# Patient Record
Sex: Male | Born: 1935 | Race: White | Hispanic: No | Marital: Married | Smoking: Former smoker
Health system: Southern US, Community
[De-identification: ages and names within clinical notes are randomized; demographics above are authoritative.]

## PROBLEM LIST (undated history)

## (undated) DIAGNOSIS — I10 Essential (primary) hypertension: Secondary | ICD-10-CM

## (undated) DIAGNOSIS — N400 Enlarged prostate without lower urinary tract symptoms: Secondary | ICD-10-CM

## (undated) DIAGNOSIS — I2510 Atherosclerotic heart disease of native coronary artery without angina pectoris: Secondary | ICD-10-CM

## (undated) DIAGNOSIS — F0390 Unspecified dementia without behavioral disturbance: Secondary | ICD-10-CM

## (undated) DIAGNOSIS — I359 Nonrheumatic aortic valve disorder, unspecified: Secondary | ICD-10-CM

## (undated) DIAGNOSIS — I509 Heart failure, unspecified: Secondary | ICD-10-CM

## (undated) DIAGNOSIS — H9190 Unspecified hearing loss, unspecified ear: Secondary | ICD-10-CM

## (undated) DIAGNOSIS — I059 Rheumatic mitral valve disease, unspecified: Secondary | ICD-10-CM

## (undated) DIAGNOSIS — I4891 Unspecified atrial fibrillation: Secondary | ICD-10-CM

## (undated) DIAGNOSIS — Z87898 Personal history of other specified conditions: Secondary | ICD-10-CM

## (undated) HISTORY — DX: Benign prostatic hyperplasia without lower urinary tract symptoms: N40.0

## (undated) HISTORY — DX: Nonrheumatic aortic valve disorder, unspecified: I35.9

## (undated) HISTORY — DX: Essential (primary) hypertension: I10

## (undated) HISTORY — DX: Unspecified atrial fibrillation: I48.91

## (undated) HISTORY — DX: Atherosclerotic heart disease of native coronary artery without angina pectoris: I25.10

## (undated) HISTORY — DX: Rheumatic mitral valve disease, unspecified: I05.9

## (undated) HISTORY — DX: Personal history of other specified conditions: Z87.898

---

## 2009-08-23 ENCOUNTER — Encounter: Payer: Self-pay | Admitting: Physician Assistant

## 2009-08-23 ENCOUNTER — Encounter: Payer: Self-pay | Admitting: Cardiology

## 2009-08-23 ENCOUNTER — Ambulatory Visit: Payer: Self-pay | Admitting: Cardiology

## 2009-08-24 ENCOUNTER — Encounter: Payer: Self-pay | Admitting: Cardiology

## 2009-08-26 ENCOUNTER — Encounter: Payer: Self-pay | Admitting: Cardiology

## 2009-08-28 ENCOUNTER — Encounter: Payer: Self-pay | Admitting: Cardiology

## 2009-08-31 ENCOUNTER — Ambulatory Visit (HOSPITAL_COMMUNITY): Admission: RE | Admit: 2009-08-31 | Discharge: 2009-08-31 | Payer: Self-pay | Admitting: Internal Medicine

## 2009-08-31 ENCOUNTER — Encounter: Payer: Self-pay | Admitting: Cardiology

## 2009-09-08 ENCOUNTER — Encounter: Payer: Self-pay | Admitting: Cardiology

## 2009-09-11 DIAGNOSIS — I10 Essential (primary) hypertension: Secondary | ICD-10-CM | POA: Insufficient documentation

## 2009-09-11 DIAGNOSIS — I482 Chronic atrial fibrillation, unspecified: Secondary | ICD-10-CM

## 2009-09-11 DIAGNOSIS — R0602 Shortness of breath: Secondary | ICD-10-CM

## 2009-09-14 ENCOUNTER — Ambulatory Visit: Payer: Self-pay | Admitting: Cardiology

## 2009-09-17 ENCOUNTER — Telehealth (INDEPENDENT_AMBULATORY_CARE_PROVIDER_SITE_OTHER): Payer: Self-pay | Admitting: *Deleted

## 2009-09-24 ENCOUNTER — Telehealth: Payer: Self-pay | Admitting: Cardiology

## 2009-09-24 ENCOUNTER — Telehealth: Payer: Self-pay | Admitting: Adult Health

## 2009-09-29 ENCOUNTER — Encounter: Payer: Self-pay | Admitting: Cardiology

## 2009-10-02 ENCOUNTER — Telehealth (INDEPENDENT_AMBULATORY_CARE_PROVIDER_SITE_OTHER): Payer: Self-pay | Admitting: *Deleted

## 2009-10-05 ENCOUNTER — Encounter (INDEPENDENT_AMBULATORY_CARE_PROVIDER_SITE_OTHER): Payer: Self-pay | Admitting: *Deleted

## 2009-10-09 ENCOUNTER — Encounter: Payer: Self-pay | Admitting: Cardiology

## 2009-10-12 ENCOUNTER — Ambulatory Visit: Payer: Self-pay | Admitting: Cardiology

## 2009-10-13 ENCOUNTER — Telehealth (INDEPENDENT_AMBULATORY_CARE_PROVIDER_SITE_OTHER): Payer: Self-pay | Admitting: *Deleted

## 2009-10-14 ENCOUNTER — Inpatient Hospital Stay (HOSPITAL_COMMUNITY): Admission: EM | Admit: 2009-10-14 | Discharge: 2009-10-29 | Payer: Self-pay | Admitting: Internal Medicine

## 2009-10-14 ENCOUNTER — Ambulatory Visit: Payer: Self-pay | Admitting: Thoracic Surgery (Cardiothoracic Vascular Surgery)

## 2009-10-14 ENCOUNTER — Ambulatory Visit: Payer: Self-pay | Admitting: Cardiology

## 2009-10-14 ENCOUNTER — Encounter: Payer: Self-pay | Admitting: Internal Medicine

## 2009-10-15 ENCOUNTER — Encounter: Payer: Self-pay | Admitting: Cardiology

## 2009-10-15 ENCOUNTER — Encounter: Payer: Self-pay | Admitting: Internal Medicine

## 2009-10-16 ENCOUNTER — Ambulatory Visit: Payer: Self-pay | Admitting: Dentistry

## 2009-10-16 HISTORY — PX: OTHER SURGICAL HISTORY: SHX169

## 2009-10-16 HISTORY — PX: MULTIPLE TOOTH EXTRACTIONS: SHX2053

## 2009-10-19 ENCOUNTER — Encounter: Payer: Self-pay | Admitting: Cardiology

## 2009-10-20 ENCOUNTER — Ambulatory Visit: Payer: Self-pay | Admitting: Dentistry

## 2009-10-23 ENCOUNTER — Encounter: Payer: Self-pay | Admitting: Thoracic Surgery (Cardiothoracic Vascular Surgery)

## 2009-10-23 ENCOUNTER — Encounter: Payer: Self-pay | Admitting: Cardiology

## 2009-10-24 ENCOUNTER — Encounter: Payer: Self-pay | Admitting: Cardiology

## 2009-10-29 ENCOUNTER — Encounter: Payer: Self-pay | Admitting: Cardiology

## 2009-11-02 ENCOUNTER — Encounter: Payer: Self-pay | Admitting: Cardiovascular Disease

## 2009-11-02 ENCOUNTER — Telehealth: Payer: Self-pay | Admitting: Cardiovascular Disease

## 2009-11-02 LAB — CONVERTED CEMR LAB: Prothrombin Time: 20.7 s

## 2009-11-04 ENCOUNTER — Encounter: Payer: Self-pay | Admitting: Cardiology

## 2009-11-04 ENCOUNTER — Telehealth: Payer: Self-pay | Admitting: Cardiology

## 2009-11-05 ENCOUNTER — Encounter: Payer: Self-pay | Admitting: Cardiology

## 2009-11-06 ENCOUNTER — Telehealth: Payer: Self-pay | Admitting: Internal Medicine

## 2009-11-09 ENCOUNTER — Telehealth (INDEPENDENT_AMBULATORY_CARE_PROVIDER_SITE_OTHER): Payer: Self-pay | Admitting: *Deleted

## 2009-11-09 ENCOUNTER — Telehealth: Payer: Self-pay | Admitting: Cardiology

## 2009-11-09 ENCOUNTER — Encounter: Payer: Self-pay | Admitting: Cardiology

## 2009-11-09 LAB — CONVERTED CEMR LAB: Prothrombin Time: 53.2 s

## 2009-11-11 ENCOUNTER — Encounter: Payer: Self-pay | Admitting: Cardiology

## 2009-11-12 ENCOUNTER — Telehealth: Payer: Self-pay | Admitting: Cardiology

## 2009-11-12 ENCOUNTER — Encounter: Payer: Self-pay | Admitting: Cardiology

## 2009-11-12 LAB — CONVERTED CEMR LAB: POC INR: 3

## 2009-11-13 ENCOUNTER — Encounter: Payer: Self-pay | Admitting: Cardiology

## 2009-11-16 ENCOUNTER — Ambulatory Visit: Payer: Self-pay | Admitting: Thoracic Surgery (Cardiothoracic Vascular Surgery)

## 2009-11-18 ENCOUNTER — Telehealth (INDEPENDENT_AMBULATORY_CARE_PROVIDER_SITE_OTHER): Payer: Self-pay | Admitting: *Deleted

## 2009-11-18 ENCOUNTER — Encounter: Payer: Self-pay | Admitting: Cardiology

## 2009-11-18 LAB — CONVERTED CEMR LAB
POC INR: 1.6
Prothrombin Time: 19.7 s

## 2009-11-25 ENCOUNTER — Encounter: Admission: AD | Admit: 2009-11-25 | Discharge: 2009-11-25 | Payer: Self-pay | Admitting: Dentistry

## 2009-12-02 ENCOUNTER — Telehealth: Payer: Self-pay | Admitting: Cardiology

## 2009-12-02 ENCOUNTER — Encounter: Payer: Self-pay | Admitting: Cardiology

## 2009-12-02 LAB — CONVERTED CEMR LAB: POC INR: 1.5

## 2009-12-03 ENCOUNTER — Encounter: Payer: Self-pay | Admitting: Cardiology

## 2009-12-10 ENCOUNTER — Telehealth: Payer: Self-pay | Admitting: Cardiology

## 2009-12-10 ENCOUNTER — Encounter: Payer: Self-pay | Admitting: Cardiology

## 2009-12-21 ENCOUNTER — Encounter: Payer: Self-pay | Admitting: Cardiology

## 2009-12-21 ENCOUNTER — Encounter
Admission: RE | Admit: 2009-12-21 | Discharge: 2009-12-21 | Payer: Self-pay | Admitting: Thoracic Surgery (Cardiothoracic Vascular Surgery)

## 2009-12-21 ENCOUNTER — Ambulatory Visit: Payer: Self-pay | Admitting: Thoracic Surgery (Cardiothoracic Vascular Surgery)

## 2009-12-21 ENCOUNTER — Encounter: Payer: Self-pay | Admitting: Internal Medicine

## 2009-12-25 ENCOUNTER — Ambulatory Visit: Payer: Self-pay | Admitting: Cardiology

## 2009-12-25 LAB — CONVERTED CEMR LAB: POC INR: 2.5

## 2010-01-01 ENCOUNTER — Ambulatory Visit: Payer: Self-pay | Admitting: Cardiology

## 2010-01-01 DIAGNOSIS — I359 Nonrheumatic aortic valve disorder, unspecified: Secondary | ICD-10-CM

## 2010-01-01 DIAGNOSIS — I2510 Atherosclerotic heart disease of native coronary artery without angina pectoris: Secondary | ICD-10-CM | POA: Insufficient documentation

## 2010-01-01 DIAGNOSIS — I059 Rheumatic mitral valve disease, unspecified: Secondary | ICD-10-CM | POA: Insufficient documentation

## 2010-01-08 ENCOUNTER — Encounter: Payer: Self-pay | Admitting: Cardiology

## 2010-01-15 ENCOUNTER — Ambulatory Visit: Payer: Self-pay | Admitting: Cardiology

## 2010-01-15 LAB — CONVERTED CEMR LAB: INR: 3.9

## 2010-01-26 ENCOUNTER — Encounter: Payer: Self-pay | Admitting: Cardiology

## 2010-03-03 ENCOUNTER — Encounter (INDEPENDENT_AMBULATORY_CARE_PROVIDER_SITE_OTHER): Payer: Self-pay | Admitting: Pharmacist

## 2010-03-12 ENCOUNTER — Ambulatory Visit: Payer: Self-pay | Admitting: Cardiology

## 2010-03-12 LAB — CONVERTED CEMR LAB: POC INR: 1.2

## 2010-03-31 ENCOUNTER — Encounter (INDEPENDENT_AMBULATORY_CARE_PROVIDER_SITE_OTHER): Payer: Self-pay | Admitting: Pharmacist

## 2010-04-26 ENCOUNTER — Ambulatory Visit: Payer: Self-pay | Admitting: Thoracic Surgery (Cardiothoracic Vascular Surgery)

## 2010-04-26 ENCOUNTER — Encounter: Payer: Self-pay | Admitting: Internal Medicine

## 2010-07-29 ENCOUNTER — Encounter: Payer: Self-pay | Admitting: Cardiology

## 2010-08-08 ENCOUNTER — Encounter: Payer: Self-pay | Admitting: Internal Medicine

## 2010-08-08 ENCOUNTER — Encounter: Payer: Self-pay | Admitting: Thoracic Surgery (Cardiothoracic Vascular Surgery)

## 2010-08-17 NOTE — Assessment & Plan Note (Signed)
Summary: NEW HOSP FU-PER DR.DANIEL   Visit Type:  Follow-up Primary Provider:  Royston Bake   History of Present Illness: Pleasant gentleman recently admitted with presumed sepsis. Blood cultures were positive. Seen in consult by Dr. Diona Browner and there was concern about the possibility of endocarditis. An echocardiogram performed on August 24, 2009 showed an ejection fraction of 50-55%, biatrial enlargement, at least moderate mitral regurgitation, moderate aortic stenosis with a mean gradient of 29 mm of mercury. A TEE was subsequently performed and revealed normal LV function, moderate to severe aortic insufficiency, mild to moderate aortic stenosis, a flail portion of the posterior mitral valve leaflet and severe mitral regurgitation. The patient was treated for his bacteremia. He now returns in followup. Note he also has permanent atrial fibrillation and is on chronic Coumadin. Since then he denies any dyspnea on exertion, orthopnea, PND, pedal edema, palpitations, syncope or chest pain. He's had no recurrent fevers or chills.  Preventive Screening-Counseling & Management  Alcohol-Tobacco     Smoking Status: quit > 6 months  Comments: Smoked pipe for about 1 yr and quit about 30 yrs   Current Medications (verified): 1)  Atenolol 25 Mg Tabs (Atenolol) .... Take One Tablet By Mouth Daily 2)  Furosemide 40 Mg Tabs (Furosemide) .... Take One Tablet By Mouth Daily. 3)  Warfarin Sodium 5 Mg Tabs (Warfarin Sodium) .... Use As Directed By Anticoagulation Clinic 4)  Lisinopril 5 Mg Tabs (Lisinopril) .... Take One Tablet By Mouth Daily 5)  Aspirin 81 Mg Tbec (Aspirin) .... Take One Tablet By Mouth Daily  Allergies: 1)  Pcn  Comments:  Nurse/Medical Assistant: The patient's medications were reviewed with the patient and were updated in the Medication List. Pt verbalized understanding.  Cyril Loosen, RN, BSN (September 14, 2009 10:21 AM)  Past History:  Past Medical History: History of  severe mitral regurgitation Moderate severe aortic insufficiency Mild to moderate aortic stenosis HYPERTENSION (ICD-401.9) ATRIAL FIBRILLATION (ICD-427.31) SEVERE SEPSIS (ICD-995.92)  Social History: Reviewed history from 09/11/2009 and no changes required. Married  NO ACTIVE Tobacco or alcohol use [employer] Smoking Status:  quit > 6 months  Review of Systems       no fevers or chills, productive cough, hemoptysis, dysphasia, odynophagia, melena, hematochezia, dysuria, hematuria, rash, seizure activity, orthopnea, PND, pedal edema, claudication. Remaining systems are negative.   Vital Signs:  Patient profile:   77 year old male Height:      65 inches Weight:      194 pounds BMI:     32.40 O2 Sat:      96 % on Room air Pulse rate:   75 / minute BP sitting:   123 / 73  (left arm) Cuff size:   regular  Vitals Entered By: Cyril Loosen, RN, BSN (September 14, 2009 10:18 AM)  Nutrition Counseling: Patient's BMI is greater than 25 and therefore counseled on weight management options.  O2 Flow:  Room air Comments Post hospital follow up   Physical Exam  General:  Well-developed well-nourished in no acute distress.  Skin is warm and dry.  HEENT is normal.  Neck is supple. No thyromegaly.  Chest is clear to auscultation with normal expansion.  Cardiovascular exam is irregular. 3/6 systolic murmur left sternal border as well as apex. 2/6 diastolic murmur left sternal border. Abdominal exam nontender or distended. No masses palpated. Extremities show no edema. neuro grossly intact    EKG  Procedure date:  09/14/2009  Findings:      Atrial fibrillation  at a rate of 58. Right bundle branch block.  Impression & Recommendations:  Problem # 1:  MULTIPLE INVOLVEMENT OF MITRAL AND AORTIC VALVES (ICD-396.8) Patient has severe mitral regurgitation, moderate to severe aortic insufficiency and mild to moderate aortic stenosis. He will most likely require valve surgery in  the future. However he is presently continuing antibiotics for his recent bacteremia. He is to complete this on March 11. 2 weeks later he will most likely need blood cultures drawn and apparently this is being managed at the [place]. I will have him return to this office in 3 months to see Dr. Diona Browner. He may need consideration of valve surgery at that time depending on his symptoms.  Problem # 2:  HYPERTENSION (ICD-401.9) Blood pressure controlled on present medications. Will continue. His updated medication list for this problem includes:    Atenolol 25 Mg Tabs (Atenolol) .Marland Kitchen... Take one tablet by mouth daily    Furosemide 40 Mg Tabs (Furosemide) .Marland Kitchen... Take one tablet by mouth daily.    Lisinopril 5 Mg Tabs (Lisinopril) .Marland Kitchen... Take one tablet by mouth daily    Aspirin 81 Mg Tbec (Aspirin) .Marland Kitchen... Take one tablet by mouth daily  Problem # 3:  ATRIAL FIBRILLATION (ICD-427.31) Patient remains in atrial fibrillation. Continue atenolol for rate control and Coumadin with goal INR 2-3. His updated medication list for this problem includes:    Atenolol 25 Mg Tabs (Atenolol) .Marland Kitchen... Take one tablet by mouth daily    Warfarin Sodium 5 Mg Tabs (Warfarin sodium) ..... Use as directed by anticoagulation clinic    Aspirin 81 Mg Tbec (Aspirin) .Marland Kitchen... Take one tablet by mouth daily  Orders: EKG w/ Interpretation (93000)  Problem # 4:  SEVERE SEPSIS (ICD-995.92) Continue antibiotics for recent bout of sepsis.  Patient Instructions: 1)  Your physician wants you to follow-up in: 3 months. You will receive a reminder letter in the mail one-two months in advance. If you don't receive a letter, please call our office to schedule the follow-up appointment. 2)  Your physician recommends that you continue on your current medications as directed. Please refer to the Current Medication list given to you today.

## 2010-08-17 NOTE — Progress Notes (Signed)
Summary: WANTS SURGERY PROCESS SPEEDED UP  Phone Note Call from Patient Call back at Home Phone [phone]   Caller: Son Call For: nurse Summary of Call: results of labs and explanation of lasix decrease was given to son and he also wanted to know if the surgical process could be speeded up for his dad since he was informed by Dr. Jens Som that after infection was gone, he would need to  have this done and didn't want to wait three months since his dad was SOB all the time from this problem. Please advise. Initial call taken by: Carlye Grippe,  October 02, 2009 2:08 PM  Follow-up for Phone Call        I saw this patient as an inpatient consult in early February at [facility] - not since.  The subsequent work-up, testing, and recommendations by my partners (most recently Dr. Jens Som)  regarding his valvular heart disease was reviewed by me today.  If in fact he has completed his full course of antibiotics, he should get a follow-up set of blood cultures (2 sets from separate sites) approximately 2 weeks out from the last day of antibiotics.  If he is short of breath also, then he can be seen sooner (can look at schedule over the next 2 weeks) and we can discuss a potential surgical referral.  Would suggest that his son and  daughter come to these visits so that everyone is on the same page. Follow-up by: Loreli Slot, MD, [facility],  October 03, 2009 9:01 PM  Additional Follow-up for Phone Call Additional follow up Details #1::        Pt's dgt-in-law Kendal Hymen) notified of Dr. Ival Bible recommendation. She states he finished antibiotics on 3/12. She is aware to have pt go to the [facility] for labs around March 25th for blood cultures. Pt scheduled for appt on 4/7 at 10:30. Notified Kendal Hymen that Dr. Diona Browner requests pt's son and dgt be at this appt. She states she will notify pt's dgt.  Additional Follow-up by: Cyril Loosen, RN, BSN,  October 05, 2009 4:55 PM     Appended Document: WANTS  SURGERY PROCESS SPEEDED UP Spoke with pt's dgt. She cannot come on 4/7. Appt r/s to 4/13 at 2:40.

## 2010-08-17 NOTE — Letter (Signed)
Summary: Custom - Delinquent Coumadin 1  [facility]  [address]   [place], [place] [address]   Phone: [phone]  Fax: [phone]     March 03, 2010 MRN: [id number]   Phillip Nolan [address] [place], [place]  [address]   Dear [name],  This letter is being sent to you as a reminder that it is necessary for you to get your INR/PT checked regularly so that we can optimize your care.  Our records indicate that you were scheduled to have a test done recently.  As of today, we have not received the results of this test.  It is very important that you have your INR checked.  Please call our office at the number listed above to schedule an appointment at your earliest convenience.    If you have recently had your protime checked or have discontinued this medication, please contact our office at the above phone number to clarify this issue.  Thank you for this prompt attention to this important health care matter.  Sincerely, Vashti Hey RN   [facility]

## 2010-08-17 NOTE — Miscellaneous (Signed)
Summary: Home Care Report/ [facility]  Home Care Report/ [facility]   Imported By: Dorise Hiss 11/13/2009 12:39:19  _____________________________________________________________________  External Attachment:    Type:   Image     Comment:   External Document

## 2010-08-17 NOTE — Letter (Signed)
Summary: Custom - Delinquent Coumadin 1  [facility]  [address] [place] [address]   Phone: [phone]  Fax: [phone]     March 31, 2010 MRN: [id number]   Phillip Nolan [address] [place], [place]  [address]   Dear Phillip Nolan,  This letter is being sent to you as a reminder that it is necessary for you to get your INR/PT checked regularly so that we can optimize your care.  Our records indicate that you were scheduled to have a test done recently.  As of today, we have not received the results of this test.  It is very important that you have your INR checked.  Please call our office at the number listed above to schedule an appointment at your earliest convenience.    If you have recently had your protime checked or have discontinued this medication, please contact our office at the above phone number to clarify this issue.  Thank you for this prompt attention to this important health care matter.  Sincerely, Vashti Hey RN  [facility] Team

## 2010-08-17 NOTE — Miscellaneous (Signed)
Summary: Home Care Report/ [facility] RE[facility]  Home Care Report/ [facility] REPORT   Imported By: Dorise Hiss 12/08/2009 09:47:17  _____________________________________________________________________  External Attachment:    Type:   Image     Comment:   External Document

## 2010-08-17 NOTE — Progress Notes (Signed)
Summary: coumadin management  Phone Note Other Incoming   Caller: Verlon Au RN [facility] Reason for Call: Discuss lab or test results Summary of Call: Called with results of PT/INR obtained on pt today.  PT 53.2  INR 4.4  Order given for pt to hold coumadin tonight, take 2.5mg  tomorrow night then decrease dose to 5mg  once daily.  Pt is increasing Ensure duse to weight loss.  [facility] to recheck INR on 11/12/09. 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 no    Recent/future dental: no  Any missed doses?: no       Is patient compliant with meds? yes         Anticoagulation Management History:      His anticoagulation is being managed by telephone today.  Positive risk factors for bleeding include an age of 75 years or older.  The bleeding index is 'intermediate risk'.  Positive CHADS2 values include History of HTN and Age > 54 years old.  Prothrombin time is 53.2.  Anticoagulation responsible provider: Diona Browner MD, Remi Deter.  INR POC: 4.4.    Anticoagulation Management Assessment/Plan:      The patient's current anticoagulation dose is Warfarin sodium 5 mg tabs: Use as directed by Anticoagulation Clinic.  The next INR is due 11/12/2009.  Anticoagulation instructions were given to [facility].  Results were reviewed/authorized by Vashti Hey RN.  He was notified by [facility].         Prior Anticoagulation Instructions: Called with results of PT/INR obtained  on pt today.  PT 28.7  INR 2.4  Oder given for pt to continue coumadin 5mg  once daily except 7.5mg  on Fridays and recheck INR on 11/09/09.  Current Anticoagulation Instructions: Called with results of PT/INR obtained on pt today.  PT 53.2  INR 4.4  Order given for pt to hold coumadin tonight, take 2.5mg  tomorrow night then decrease dose to 5mg  once daily.  Pt is increasing Ensure duse to weight loss.  [facility] to recheck INR on 11/12/09.

## 2010-08-17 NOTE — Miscellaneous (Signed)
Summary: Home Care Report/ [facility]  Home Care Report/ [facility]   Imported By: Dorise Hiss 01/28/2010 14:59:49  _____________________________________________________________________  External Attachment:    Type:   Image     Comment:   External Document

## 2010-08-17 NOTE — Consult Note (Signed)
Summary: CARDIOLOGY CONSULT/ [facility]  CARDIOLOGY CONSULT/ [facility]   Imported By: Zachary George 09/11/2009 15:46:37  _____________________________________________________________________  External Attachment:    Type:   Image     Comment:   External Document

## 2010-08-17 NOTE — Medication Information (Signed)
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 no    Recent/future dental: no  Any missed doses?: no       Is patient compliant with meds? yes       Allergies: 1)  Pcn  Anticoagulation Management History:      The patient is taking warfarin and comes in today for a routine follow up visit.  Positive risk factors for bleeding include an age of 75 years or older.  The bleeding index is 'intermediate risk'.  Positive CHADS2 values include History of HTN and Age > 61 years old.  Today's INR is 3.9.  Anticoagulation responsible provider: Antoine Poche MD, Fayrene Fearing.  INR POC: 3.9.    Anticoagulation Management Assessment/Plan:      The patient's current anticoagulation dose is Warfarin sodium 5 mg tabs: Use as directed by Anticoagulation Clinic.  The target INR is 2.0-3.0.  The next INR is due 02/05/2010.  Anticoagulation instructions were given to patient.  Results were reviewed/authorized by Vashti Hey RN.  He was notified by Vashti Hey RN.         Prior Anticoagulation Instructions: INR 2.5 Continue coumadin 5mg  once daily except 7.5mg  on M,W,F  Current Anticoagulation Instructions: INR 3.9 Hold coumadin tonight then decrease dose to 5mg  once daily except 7.5mg  on Mondays and Thursdays

## 2010-08-17 NOTE — Progress Notes (Signed)
Summary: Dgt request  Phone Note Call from Patient Call back at [phone]   Caller: Daughter Summary of Call: Pt's dgt, Lanora Manis, left message on my voicemail stating pt was admitted to [facility] last night with CHF. She stated in message that she wanted to know if someone could s/w her while she is in town. Left message to notify pt's dgt, per [facility] protocol, we do not see pts at [facility] unless a cardiology consult has been requested. If this was done, Dr. Diona Browner (who is taking over her father's care for Dr. Jens Som) will be in the hospital tomorrow morning. Notified pt's dgt, in message, that I will let Dr. Diona Browner know her request and she can return my call if she needs anything further. Initial call taken by: Cyril Loosen, RN, BSN,  October 13, 2009 4:28 PM

## 2010-08-17 NOTE — Progress Notes (Signed)
Summary: Daughter wants to discuss pt's condtion  Phone Note Call from Patient Call back at [phone], [phone]   Caller: Daughter Summary of Call: Pt's daughter, Phillip Nolan, called in reference to her father. She states she lives out of town and would like to s/w the physician regarding her father's condition. She states her brother is going to have a release of info signed saying we can speak with her. She states the best number to reach her is at work [phone] or home [phone]. Left message to call back at pt's work number. Initial call taken by: Cyril Loosen, RN, BSN,  September 17, 2009 4:36 PM  Follow-up for Phone Call        Spoke with pt's dgt. She was concerned that pt needed surgery right now. She lives more than 3 hours away and wants to know if she needs to make arrangements to come down. Notified pt's dgt that pt will be seen back in 3 months and a determination regarding surgery will be made at that time based on patient's symptoms. Also notified dgt  if our physician feels pt needs surgery, he will be referred to thoracic surgeons for evaluation and ultimate decision regarding surgery. Pt's dgt verbalized understanding of plan. She states if anything emergent comes up she can be reached at work or home numbers previously given or at cell number of [phone].  Follow-up by: Cyril Loosen, RN, BSN,  September 18, 2009 10:32 AM

## 2010-08-17 NOTE — Progress Notes (Signed)
Summary: coumadin management  Phone Note Other Incoming   Caller: Verlon Au RN [facility] Reason for Call: Discuss lab or test results Summary of Call: Called with results of INR obtained on pt today.  INR 3.0  Order given for pt to continue coumadin 5mg  once daily and [facility] to recheck INR on 11/18/09. 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 no    Recent/future dental: no  Any missed doses?: no       Is patient compliant with meds? yes         Anticoagulation Management History:      His anticoagulation is being managed by telephone today.  Positive risk factors for bleeding include an age of 75 years or older.  The bleeding index is 'intermediate risk'.  Positive CHADS2 values include History of HTN and Age > 23 years old.  Anticoagulation responsible provider: Dietrich Pates MD, Molly Maduro.  INR POC: 3.0.    Anticoagulation Management Assessment/Plan:      The patient's current anticoagulation dose is Warfarin sodium 5 mg tabs: Use as directed by Anticoagulation Clinic.  The target INR is 2.0-3.0.  The next INR is due 11/18/2009.  Anticoagulation instructions were given to [facility].  Results were reviewed/authorized by Vashti Hey RN.  He was notified by Arther Dames [facility].         Prior Anticoagulation Instructions: Called with results of PT/INR obtained on pt today.  PT 53.2  INR 4.4  Order given for pt to hold coumadin tonight, take 2.5mg  tomorrow night then decrease  dose to 5mg  once daily.  Pt is increasing Ensure duse to weight loss.  [facility] to recheck INR on 11/12/09.  Current Anticoagulation Instructions: Called with results of INR obtained on pt today.  INR 3.0  Order given for pt to continue coumadin 5mg  once daily and [facility] to recheck INR on 11/18/09.

## 2010-08-17 NOTE — Miscellaneous (Signed)
Summary: Home Care Report/ [facility]  Home Care Report/ [facility]   Imported By: Dorise Hiss 01/11/2010 16:23:53  _____________________________________________________________________  External Attachment:    Type:   Image     Comment:   External Document

## 2010-08-17 NOTE — Progress Notes (Signed)
  Phone Note Other Incoming   Caller: Verlon Au RN [facility] Reason for Call: Discuss lab or test results Summary of Call: Called with results of PT/INR obtained on pt today.  PT 24.9  INR 2.1  Order given for pt to continue coumadin 5mg  once daily except 7.5mg  on M,W,F and [facility] to recheck INR on 12/25/09. 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 no    Recent/future dental: no  Any missed doses?: no       Is patient compliant with meds? yes         Anticoagulation Management History:      His anticoagulation is being managed by telephone today.  Positive risk factors for bleeding include an age of 49 years or older.  The bleeding index is 'intermediate risk'.  Positive CHADS2 values include History of HTN and Age > 63 years old.  Prothrombin time is 24.9.  Anticoagulation responsible provider: Diona Browner MD, Remi Deter.  INR POC: 2.1.    Anticoagulation Management Assessment/Plan:      The patient's current anticoagulation dose is Warfarin sodium 5 mg tabs: Use as directed by Anticoagulation Clinic.  The target INR is 2.0-3.0.  The next INR is due 12/25/2009.  Anticoagulation instructions were given to [facility].  Results were reviewed/authorized by Vashti Hey RN.  He was notified by [facility] Dames [facility].         Prior Anticoagulation Instructions: INR 1.5 Increase coumadin to 5mg  once daily except 7.5mg  on M,W,F [facility] to recheck INR on  12/10/09  Current Anticoagulation Instructions: Called with results of PT/INR obtained on pt today.  PT 24.9  INR 2.1  Order given for pt to continue coumadin 5mg  once daily except 7.5mg  on M,W,F.   Pt d/c from [facility] today.  Next INR check in [place] office 12/25/09.

## 2010-08-17 NOTE — Progress Notes (Signed)
Summary: Office Visit/ TCT OFFICE VISIT  Office Visit/ TCT OFFICE VISIT   Imported By: Dorise Hiss 11/19/2009 09:22:33  _____________________________________________________________________  External Attachment:    Type:   Image     Comment:   External Document

## 2010-08-17 NOTE — Miscellaneous (Signed)
Summary: Home Care Report/ [facility]  Home Care Report/ [facility]   Imported By: Dorise Hiss 10/19/2009 14:04:17  _____________________________________________________________________  External Attachment:    Type:   Image     Comment:   External Document

## 2010-08-17 NOTE — Progress Notes (Signed)
Summary: coumadin management  Phone Note Other Incoming   Caller: Verlon Au RN [facility] Reason for Call: Discuss lab or test results Summary of Call: Called with results of INR obtained on pt today.  INR 1.5  Order given to increase coumadin to 5mg  once daily except 7.5mg  on M,W,F and AHC to recheck INR on 12/10/09. Initial call taken by: Vashti Hey RN,  Dec 02, 2009 10:43 AM      Anticoagulation Management History:      His anticoagulation is being managed by telephone today.  Positive risk factors for bleeding include an age of 75 years or older.  The bleeding index is 'intermediate risk'.  Positive CHADS2 values include History of HTN and Age > 65 years old.  Anticoagulation responsible provider: Diona Browner MD, Remi Deter.  INR POC: 1.5.    Anticoagulation Management Assessment/Plan:      The patient's current anticoagulation dose is Warfarin sodium 5 mg tabs: Use as directed by Anticoagulation Clinic.  The target INR is 2.0-3.0.  The next INR is due 12/10/2009.  Anticoagulation instructions were given to patient.  Results were reviewed/authorized by Vashti Hey RN.  He was notified by Vashti Hey RN.         Prior Anticoagulation Instructions: INR 1.6 Today take 7.5mg s then resume 5mg s daily. Recheck in 2 weeks. Orders given to [place] at [facility].   Current Anticoagulation Instructions: INR 1.5 Increase coumadin to 5mg  once daily except 7.5mg  on M,W,F AHC to recheck INR on 12/10/09   Anticoagulant Therapy  Managed by: Vashti Hey RN PCP: Royston Bake Supervising MD: Diona Browner MD, Remi Deter Indication 1: Mechanical Prosthetic Heart Valve (prevent systemic emboli) Indication 2: Mitral Valve Repair Valve Type: Bioprosthetic Valve Position: Aortic Lab Used: [facility] [place] Site: [place] INR POC 1.5 INR RANGE 2.0-3.0  Dietary changes: no    Health status changes: no    Bleeding/hemorrhagic complications: no    Recent/future hospitalizations: no    Any changes in medication  regimen? no    Recent/future dental: no  Any missed doses?: no       Is patient compliant with meds? yes

## 2010-08-17 NOTE — Assessment & Plan Note (Signed)
Summary: [facility], R/S FROM 11/18/09-JM   Visit Type:  Follow-up Primary Provider:  Royston Bake   History of Present Illness: 77 year old male presents for followup. The patient canceled his last scheduled visit with [place]. I note that he recently saw Dr. Cornelius Moras on 6 June and according to the note, he was doing quite well. He did have a followup chest x-ray, detailed below. He is here with his son today.  He reports doing very well, walking and also doing some swimming for exercise. He reports NYHA class II dyspnea on exertion, no palpitations, no chest pain. He has occasional mild swelling of his right foot, but otherwise no edema or orthopnea.  He reports compliance with his medications. He continues on Coumadin, and does have some ecchymoses on his arms, but denies any other major spontaneous bleeding. We discussed continuing Coumadin in light of his persistent atrial fibrillation, for stroke prophylaxis.  Some discrepancy noted in LVEF between cardiac catheterization and perioperative TEE as noted below. We will arrange a followup echocardiogram prior to his next visit.   Preventive Screening-Counseling & Management  Alcohol-Tobacco     Smoking Status: quit     Year Quit: 1980  Current Medications (verified): 1)  Atenolol 25 Mg Tabs (Atenolol) .... Take One Tablet By Mouth Daily 2)  Furosemide 20 Mg Tabs (Furosemide) .... Take 1 Tablet By Mouth Once A Day 3)  Warfarin Sodium 5 Mg Tabs (Warfarin Sodium) .... Use As Directed By Anticoagulation Clinic 4)  Lisinopril 5 Mg Tabs (Lisinopril) .... Take One Tablet By Mouth Daily 5)  Aspirin 81 Mg Tbec (Aspirin) .... Take One Tablet By Mouth Daily  Allergies (verified): 1)  Pcn  Comments:  Nurse/Medical Assistant: The patient is currently on medications but does not know the name or dosage at this time. Instructed to contact our office with details. Will update medication list at that time.  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 - no clear vegetation by TEE Aortic Insufficiency - moderate Aortic Stenosis - moderate Hypertension Mitral Insufficiency - severe Atrial Fibrillation BPH CAD - nonobstructive, LVEF 35% at cath and 55% at peri-op TEE  Past Surgical History: Last updated: 11/18/2009 Median sterntomy for AVR (27mm Edwards pericardial) and MV repair 4/11 Multiple teeth extractions 4/11 (pre-op above)  Social History: Last updated: 11/18/2009 Married  Tobacco Use - No Alcohol Use - no [place]:  [place]  Social History: Smoking Status:  quit  Review of Systems  The patient denies anorexia, fever, chest pain, syncope, dyspnea on exertion, prolonged cough, headaches, melena, hematochezia, and severe indigestion/heartburn.         Otherwise reviewed and negative.  Vital Signs:  Patient profile:   77 year old male Height:      65 inches Weight:      184 pounds Pulse rate:   69 / minute BP sitting:   123 / 76  (left arm) Cuff size:   regular  Vitals Entered By: Carlye Grippe (January 01, 2010 2:04 PM)  Physical Exam  Additional Exam:  Elderly male in no acute distress. HEENT: Conjunctiva and lids normal, oral pharynx status post multiple extractions, moist mucosa. Neck: Supple, no JVD or bruits. Lungs: Clear to auscultation, nonlabored. Cardiac: Irregularly irregularr rate and rhythm with split S2, soft systolic murmur at the base, no S3. Abdomen: Soft, nontender, bowel sounds present. Thorax: Well-healed sternal incision. Extremities: Venous stasis distally, distal pulses 1+, no pitting edema.  Skin: Warm and dry, scattered ecchymoses on the forearms. Musculoskeletal: No gross deformities. Neuropsychiatric: Alert and oriented x3, mildly hard of hearing.   CXR  Procedure date:  12/21/2009  Findings:      Stable cardiomegaly and mild right middle lobe atelectasis. No acute  cardiopulmonary disease otherwise. Mild coronary venous hypertension without overt edema.  EKG  Procedure date:  01/01/2010  Findings:      Atrial fibrillation at 79 beats per minute with right bundle branch block and left anterior fascicular block, single PVC.  Impression & Recommendations:  Problem # 1:  AORTIC VALVE DISORDERS (ICD-424.1)  History of moderate aortic stenosis and regurgitation in the setting of probable endocarditis, now status post aortic valve replacement with a 27 mm Edwards pericardial valve. Patient is doing very well postoperatively. He recently saw Dr. Cornelius Moras. Chest x-ray reviewed above. A followup 2-D echocardiogram will be obtained prior to his next visit in 3 months.  His updated medication list for this problem includes:    Atenolol 25 Mg Tabs (Atenolol) .Marland Kitchen... Take one tablet by mouth daily    Furosemide 20 Mg Tabs (Furosemide) .Marland Kitchen... Take 1 tablet by mouth once a day    Lisinopril 5 Mg Tabs (Lisinopril) .Marland Kitchen... Take one tablet by mouth daily  Problem # 2:  MITRAL VALVE DISORDER (ICD-424.0)  History of severe mitral regurgitation in the setting of probable endocarditis, now status post mitral valve repair.  His updated medication list for this problem includes:    Atenolol 25 Mg Tabs (Atenolol) .Marland Kitchen... Take one tablet by mouth daily    Furosemide 20 Mg Tabs (Furosemide) .Marland Kitchen... Take 1 tablet by mouth once a day    Lisinopril 5 Mg Tabs (Lisinopril) .Marland Kitchen... Take one tablet by mouth daily  Problem # 3:  ATRIAL FIBRILLATION (ICD-427.31)  Persistent, plan to continue Coumadin long-term. He is followed in our Coumadin clinic. Rate is adequately controlled today.  His updated medication list for this problem includes:    Atenolol 25 Mg Tabs (Atenolol) .Marland Kitchen... Take one tablet by mouth daily    Warfarin Sodium 5 Mg Tabs (Warfarin sodium) ..... Use as directed by anticoagulation clinic    Aspirin 81 Mg Tbec (Aspirin) .Marland Kitchen... Take one tablet by mouth daily  Orders: EKG w/  Interpretation (93000)  Problem # 4:  CORONARY ATHEROSCLEROSIS NATIVE CORONARY ARTERY (ICD-414.01)  Nonobstructive at preoperative cardiac catheterization.  His updated medication list for this problem includes:    Atenolol 25 Mg Tabs (Atenolol) .Marland Kitchen... Take one tablet by mouth daily    Warfarin Sodium 5 Mg Tabs (Warfarin sodium) ..... Use as directed by anticoagulation clinic    Lisinopril 5 Mg Tabs (Lisinopril) .Marland Kitchen... Take one tablet by mouth daily    Aspirin 81 Mg Tbec (Aspirin) .Marland Kitchen... Take one tablet by mouth daily  Patient Instructions: 1)  Your physician wants you to follow-up in: 3 months. You will receive a reminder letter in the mail one-two months in advance. If you don't receive a letter, please call our office to schedule the follow-up appointment. 2)  Continue Coumadin-keep appt for Coumadin Clinic on January 15, 2010 at 2:30pm. 3)  Your physician has requested that you have an echocardiogram.  Echocardiography is a painless test that uses sound waves to create images of your heart. It provides your doctor with information about the size and shape of your heart and how well your heart's chambers and valves are working.  This procedure takes approximately one hour. There are no restrictions for this procedure. DO  TEST IN 3 MONTHS BEFORE NEXT OFFICE VISIT.

## 2010-08-17 NOTE — Progress Notes (Signed)
Summary: coumadin management  Phone Note From Other Clinic Call back at [phone]   Caller: LESLIE Reason for Call: Medication Check Summary of Call: PT 20.7 INR 1.7 5MG  DAILY Initial call taken by: Claudette Laws,  November 02, 2009 10:03 AM  Follow-up for Phone Call        Order given for pt to take coumadin 7.5mg  tonight, 5mg  tomorrow night and AHC to recheck INR in Wed. 11/04/09 Follow-up by: Vashti Hey RN,  November 02, 2009 10:31 AM     Anticoagulant Therapy  Managed by: Vashti Hey RN PCP: Royston Bake Supervising MD: [place] Emms MD, Theron Arista Indication 1: Mechanical Prosthetic Heart Valve (prevent systemic emboli) Indication 2: Mitral Valve Repair Valve Type: Bioprosthetic Valve Position: Aortic Lab Used: [facility] [place] Site: [place] PT 20.7 INR POC 1.7  Dietary changes: no    Health status changes: no    Bleeding/hemorrhagic complications: no    Recent/future hospitalizations: yes       Details: s/p [facility] for AVR and MV repair  Any changes in medication regimen? yes       Details: Started on coumadin 5mg  qd   Recent/future dental: no  Any missed doses?: no       Is patient compliant with meds? yes      Comments: D/C from [facility] on 10/29/09 on Coumadin 5mg  once daily     Anticoagulation Management History:      His anticoagulation is being managed by telephone today.  Positive risk factors for bleeding include an age of 75 years or older.  The bleeding index is 'intermediate risk'.  Positive CHADS2 values include History of HTN and Age > 12 years old.  Prothrombin time is 20.7.  Anticoagulation responsible provider: [place] Emms MD, Theron Arista.  INR POC: 1.7.    Anticoagulation Management Assessment/Plan:      The patient's current anticoagulation dose is Warfarin sodium 5 mg tabs: Use as directed by Anticoagulation Clinic.  The next INR is due 11/05/2009.  Anticoagulation instructions were given to [facility].  Results were reviewed/authorized by Vashti Hey RN.  He was notified by Arther Dames [facility].         Current Anticoagulation Instructions: INR 1.7 Discharged from [facility] on 5mg  once daily 10/29/09 Take coumadin 7.5mg  tonight, 5mg  tomorrow night then recheck INR on 11/05/09

## 2010-08-17 NOTE — Miscellaneous (Signed)
Summary: Home Care Report/ [facility]  Home Care Report/ [facility]   Imported By: Dorise Hiss 11/18/2009 09:35:25  _____________________________________________________________________  External Attachment:    Type:   Image     Comment:   External Document

## 2010-08-17 NOTE — Letter (Signed)
Summary: [facility] D/C DR. Michelene Gardener  [facility] D/C DR. LINDA OBRIEN   Imported By: Zachary George 09/11/2009 15:49:58  _____________________________________________________________________  External Attachment:    Type:   Image     Comment:   External Document

## 2010-08-17 NOTE — Medication Information (Signed)
Summary: ccr-lr  Anticoagulant Therapy  Managed by: Vashti Hey RN PCP: Royston Bake Supervising MD: Diona Browner MD, Remi Deter Indication 1: Mechanical Prosthetic Heart Valve (prevent systemic emboli) Indication 2: Mitral Valve Repair Valve Type: Bioprosthetic Valve Position: Aortic Lab Used: [facility] [place] Site: [place] INR POC 2.5 INR RANGE 2.0-3.0  Dietary changes: no    Health status changes: no    Bleeding/hemorrhagic complications: no    Recent/future hospitalizations: no    Any changes in medication regimen? no    Recent/future dental: no  Any missed doses?: no       Is patient compliant with meds? yes       Allergies: 1)  Pcn  Anticoagulation Management History:      The patient is taking warfarin and comes in today for a routine follow up visit.  Positive risk factors for bleeding include an age of 3 years or older.  The bleeding index is 'intermediate risk'.  Positive CHADS2 values include History of HTN and Age > 41 years old.  Anticoagulation responsible provider: Diona Browner MD, Remi Deter.  INR POC: 2.5.    Anticoagulation Management Assessment/Plan:      The patient's current anticoagulation dose is Warfarin sodium 5 mg tabs: Use as directed by Anticoagulation Clinic.  The target INR is 2.0-3.0.  The next INR is due 01/15/2010.  Anticoagulation instructions were given to patient.  Results were reviewed/authorized by Vashti Hey RN.  He was notified by Vashti Hey RN.         Prior Anticoagulation Instructions: Called with results of PT/INR obtained on pt today.  PT 24.9  INR 2.1  Order given for pt to continue coumadin 5mg  once daily except 7.5mg  on M,W,F.   Pt d/c from [facility] today.  Next INR check in [place] office 12/25/09.  Current Anticoagulation Instructions: INR 2.5 Continue coumadin 5mg  once daily except 7.5mg  on M,W,F

## 2010-08-17 NOTE — Medication Information (Signed)
Summary: [facility]- R/S FROM NO SHOW IN JULY  Anticoagulant Therapy  Managed by: Vashti Hey RN PCP: Royston Bake Supervising MD: Myrtis Ser MD, Tinnie Gens Indication 1: Mechanical Prosthetic Heart Valve (prevent systemic emboli) Indication 2: Mitral Valve Repair Valve Type: Bioprosthetic Valve Position: Aortic Lab Used: [facility] [place] Site: [place] INR POC 1.2 INR RANGE 2.0-3.0  Dietary changes: no    Health status changes: no    Bleeding/hemorrhagic complications: no    Recent/future hospitalizations: no    Any changes in medication regimen? no    Recent/future dental: no  Any missed doses?: no       Is patient compliant with meds? yes       Allergies: 1)  Pcn  Anticoagulation Management History:      The patient is taking warfarin and comes in today for a routine follow up visit.  Positive risk factors for bleeding include an age of 65 years or older.  The bleeding index is 'intermediate risk'.  Positive CHADS2 values include History of HTN and Age > 94 years old.  His last INR was 3.9.  Anticoagulation responsible provider: Myrtis Ser MD, Tinnie Gens.  INR POC: 1.2.  Cuvette Lot#: [id number].    Anticoagulation Management Assessment/Plan:      The patient's current anticoagulation dose is Warfarin sodium 5 mg tabs: Use as directed by Anticoagulation Clinic.  The target INR is 2.0-3.0.  The next INR is due 03/17/2010.  Anticoagulation instructions were given to patient.  Results were reviewed/authorized by Vashti Hey RN.  He was notified by Vashti Hey RN.         Prior Anticoagulation Instructions: INR 3.9 Hold coumadin tonight then decrease dose to 5mg  once daily except 7.5mg  on Mondays and Thursdays  Current Anticoagulation Instructions: INR 1.2 Denies missing doses but memory is failing Take coumadin 7.5mg  once daily and recheck INR 03/17/10 Prescriptions: ATENOLOL 25 MG TABS (ATENOLOL) Take one tablet by mouth daily  #45 x 3   Entered by:   Vashti Hey RN   Authorized by:    Loreli Slot, MD, [facility]   Signed by:   Vashti Hey RN on 03/12/2010   Method used:   Electronically to        [facility]  [address] (retail)       [address]       [place], [place]  [address]       Ph: [phone]       Fax: [phone]   RxID:   [id number]

## 2010-08-17 NOTE — Letter (Signed)
Summary: [facility] Office Visit   [facility] Office Visit   Imported By: Roderic Ovens 05/04/2010 11:34:27  _____________________________________________________________________  External Attachment:    Type:   Image     Comment:   External Document  Appended Document: [facility] Office Visit  Given that this patient has valvular heart disease, he would not be a candidate for pradaxa which is only FDA approved for nonvalvular heart disease.

## 2010-08-17 NOTE — Progress Notes (Signed)
  Phone Note Call from Patient Call back at Home Phone [phone]   Caller: Son Details for Reason: Request sleeping medication  Summary of Call: [name] son called requesting medications to help his father sleep at night.  He was recenty discharged from [facility] after being treated for pneumonia, and is receiving IV abx.  The son states that his father is not sleeping well at night, has mild dementia (not new) and is becoming more short of breath.  He is requesting medication to help him sleep so that the does not breath so hard.  I have explained to the son that it would be dangerous to give sleep medication to elderly adult with dementia who is continuing to have respiratory difficulties.  I would not be prescribing the medication.  If his breathing status continues to deteriorate, I advised the son to call EMS and to have him transported to [facility] for further evaluation and treatment.  The son verbalizes understanding. Initial call taken by: Joni Reining NP

## 2010-08-17 NOTE — Letter (Signed)
Summary: Appointment- Rescheduled  [facility] [address]   [place], [place] [address]   Phone: [phone]  Fax: [phone]     October 05, 2009 MRN: [id number]   Phillip Nolan [address] [place], [place]  [address]   Dear [name],   Due to a change in our office schedule, your appointment on April 7th at 10:30           must be changed.  The appointment has been rescheduled to Wednesday, April 13th at 2:40 pm. We look forward to participating in your health care needs. Please contact us at the number listed above if you have a conflict with this appointment time.      Sincerely,  Glass blower/designer

## 2010-08-17 NOTE — Progress Notes (Signed)
Summary: help with sleep  Phone Note From Other Clinic   [facility]er: [facility] [phone] Request: Talk with Nurse Details for Reason: Pt need something sleep, nerves. [facility]  Initial call taken by: Lorne Skeens,  September 24, 2009 2:37 PM  Follow-up for Phone Call        S/W Verlon Au and per the pt's caregiver who contacted her the pt has not been able to sleep for worrying about his "leaky valve" that he found out about last week. She has contacted the [place] [facility] but they have not called her back. I ask her to give them another call because they would need to be the ones to prescribe these type of meds. She understands and will call them.  Follow-up by: Duncan Dull, RN, BSN,  September 24, 2009 2:44 PM     Appended Document: help with sleep Pt's dgt-in-law left message on my voicemail stating home health nurse was suppose to call regarding pt getting something for sleep. Home health nurse spoke with Shawna Orleans in [facility] office who notified her to obtain sleep medicine from primary MD. Jeanene Erb pt's dgt-in-law, Kendal Hymen, to notify her of this also. Pt's son answered and stated his wife wasn't home. I attempted to notify pt's son to have primary MD prescribe this for him. Pt's son began to ramble about whether pt needs sleep medicine, who should prescribe this medicine, why he isn't referred to surgeon now for valve repair, why he will see Dr. Diona Browner in follow up instead of Dr. Jens Som, how he is stuck in the middle between what doctor has told him and what his father wants, how he cares for father but sister who doesn't live here doesn't trust him, and so forth. He repeated himself several times during the course of the lengthy conversation. At one point, he put his father on the phone who threatened to sue [place] if he fell out in the floor because he didn't get his sleep medicine. Pt and his son were notified several times that he is to contact primary MD for sleep  medicine. Pt's son is also notified that Dr. Jens Som felt pt should return in 3 months for evaluation and determination if referral to [facility] is needed. Pt's son notified that patient will establish with Dr. Diona Browner as he is in our office on a more regular basis than Dr. Jens Som. However, pt could transfer care to [facility] office if he wishes to continue to see Dr. Jens Som.

## 2010-08-17 NOTE — Progress Notes (Signed)
Summary: Office Visit/ [facility] OFFICE VISIT  Office Visit/ [facility] OFFICE VISIT   Imported By: Dorise Hiss 01/01/2010 10:34:51  _____________________________________________________________________  External Attachment:    Type:   Image     Comment:   External Document

## 2010-08-17 NOTE — Progress Notes (Signed)
  Phone Note Call from Patient   Summary of Call: Patient recently d/c'd from hospital after AVR and MV repair 2 weeks ago. Son called to say that the patient has had some rattling in his chest and mild edema in his feet. No fevers or chills. No signifcant dyspnea. Weight coming down. Patient wanted to know if he could take an extra lasix tablet. I told them to have him take an extra dose of lasix and kcl tonight and if not feeling better call us back. Dolores Patty, MD, [facility]  November 06, 2009 10:51 PM

## 2010-08-17 NOTE — Miscellaneous (Signed)
Summary: Home Care Report/ [facility]  Home Care Report/ [facility]   Imported By: Dorise Hiss 01/11/2010 16:27:42  _____________________________________________________________________  External Attachment:    Type:   Image     Comment:   External Document

## 2010-08-17 NOTE — Progress Notes (Signed)
Summary: Home health call   Phone Note From Other Clinic   Caller: [facility] [phone] Call For: wt loss and congestion Summary of Call: Verlon Au from [facility] reports 9.5 lbs wt loss since hospital d/c on 11/04/2009 pt has congestion with white sputum some yellow streaking pt is on lasix 40mg  daily, nurse states you can see pt's heart beating through his chest  Initial call taken by: Teressa Lower RN,  November 09, 2009 1:35 PM  Follow-up for Phone Call        Spoke with Verlon Au, home health nurse: Pt continues to have congestion, coughing up white sputum. Also concerned about weight loss. O2 sat down but increased to 95% after taking deep breaths. Pt has f/u planned with Dr. Cornelius Moras on May 2nd and with Dr. Diona Browner on May 4th.  Follow-up by: Cyril Loosen, RN, BSN,  November 09, 2009 4:00 PM  Additional Follow-up for Phone Call Additional follow up Details #1::        Patient followed in [place] office.  Should keep follow-up.  Go ahead and get CXR - results to Dr. Cornelius Moras as well. Additional Follow-up by: Loreli Slot, MD, [facility],  November 09, 2009 5:03 PM    Additional Follow-up for Phone Call Additional follow up Details #2::    Left message to call back on Leslie's voicemail. Cyril Loosen, RN, BSN  November 10, 2009 12:11 PM  Verlon Au, home health nurse, notified to have pt go to the [facility] for CXR. Follow-up by: Cyril Loosen, RN, BSN,  November 10, 2009 12:22 PM

## 2010-08-17 NOTE — Letter (Signed)
Summary: [facility]  [facility]   Imported By: Roderic Ovens 01/19/2010 09:18:50  _____________________________________________________________________  External Attachment:    Type:   Image     Comment:   External Document

## 2010-08-17 NOTE — Progress Notes (Signed)
Summary: Needs f/u appt rescheduled  Phone Note Outgoing Call Call back at [facility] Phone [phone]   Call placed by: Cyril Loosen, RN, BSN,  Nov 18, 2009 1:31 PM Call placed to: Patient Summary of Call: Pt called the office to cancel follow up appt with Dr. Diona Browner for today. Per Judeth Cornfield, pt stated he saw Dr. Cornelius Moras and he does not follow up with [place]. Attempted to reach pt to stress importance of continued follow up with cardiology. Pt's line was busy.  Initial call taken by: Cyril Loosen, RN, BSN,  Nov 18, 2009 1:42 PM  Follow-up for Phone Call        Spoke with pt's dgt-in-law. Pt scheduled to see Dr. Diona Browner on Friday, January 01, 2010 at 2:00pm. Pt's dgt-in-law states pt doesn't have primary MD other than the [place]. They do not want him going to the [place] right now. Notified a list of local primary md's will be mailed to them.  Follow-up by: Cyril Loosen, RN, BSN,  Dec 02, 2009 3:21 PM

## 2010-08-17 NOTE — Progress Notes (Signed)
Summary: coumadin management  Phone Note Other Incoming   Caller: Verlon Au RN [facility] Reason for Call: Discuss lab or test results Summary of Call: Called with results of PT/INR obtained on pt today.  PT 28.7  INR 2.4  Oder given for pt to continue coumadin 5mg  once daily except 7.5mg  on Fridays and recheck INR on 11/09/09. 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 no    Recent/future dental: no  Any missed doses?: no       Is patient compliant with meds? yes         Anticoagulation Management History:      The patient is taking warfarin and comes in today for a routine follow up visit.  Positive risk factors for bleeding include an age of 75 years or older.  The bleeding index is 'intermediate risk'.  Positive CHADS2 values include History of HTN and Age > 63 years old.  Anticoagulation responsible provider: Diona Browner MD, Remi Deter.  Cuvette Lot#: [id number].    Anticoagulation Management Assessment/Plan:      The patient's current anticoagulation dose is Warfarin sodium 5 mg tabs: Use as directed by Anticoagulation Clinic.  The next INR is due 11/09/2009.  Anticoagulation instructions were given to [facility].  Results were reviewed/authorized by Vashti Hey RN.  He was notified by Arther Dames [employer].         Prior Anticoagulation Instructions: INR 1.7 Dis[facility] from [facility] [address] on 5mg  once daily 10/29/09 Take coumadin 7.5mg  tonight, 5mg  tomorrow night then recheck INR on  11/05/09  Current Anticoagulation Instructions: Called with results of PT/INR obtained on pt today.  PT 28.7  INR 2.4  Oder given for pt to continue coumadin 5mg  once daily except 7.5mg  on Fridays and recheck INR on 11/09/09.

## 2010-08-17 NOTE — Medication Information (Signed)
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 no    Recent/future dental: no  Any missed doses?: yes     Details: Missed last night  Is patient compliant with meds? yes      Comments: Seeing Dr Diona Browner today  Allergies: 1)  Pcn  Anticoagulation Management History:      His anticoagulation is being managed by telephone today.  Positive risk factors for bleeding include an age of 75 years or older.  The bleeding index is 'intermediate risk'.  Positive CHADS2 values include History of HTN and Age > 10 years old.  Prothrombin time is 19.7.  Anticoagulation responsible provider: Diona Browner MD, Remi Deter.  INR POC: 1.6.    Anticoagulation Management Assessment/Plan:      The patient's current anticoagulation dose is Warfarin sodium 5 mg tabs: Use as directed by Anticoagulation Clinic.  The target INR is 2.0-3.0.  The next INR is due 12/02/2009.  Anticoagulation instructions were given to [facility].  Results were reviewed/authorized by Bethena Midget, RN, BSN.  He was notified by Bethena Midget, RN, BSN.         Prior Anticoagulation Instructions: Called with results of INR obtained on pt today.  INR 3.0  Order given for pt to continue coumadin 5mg  once daily and [facility] to recheck INR on 11/18/09.  Current Anticoagulation Instructions: INR 1.6 Today take 7.5mg s then resume 5mg s daily. Recheck in 2 weeks. Orders given to [place] at [facility].

## 2010-08-19 NOTE — Miscellaneous (Signed)
Summary: Home Care Report/ [facility]  Home Care Report/ [facility]   Imported By: Dorise Hiss 08/04/2010 08:54:26  _____________________________________________________________________  External Attachment:    Type:   Image     Comment:   External Document

## 2010-09-06 ENCOUNTER — Encounter (INDEPENDENT_AMBULATORY_CARE_PROVIDER_SITE_OTHER): Payer: Self-pay | Admitting: Pharmacist

## 2010-09-14 NOTE — Letter (Signed)
Summary: [facility]ent Coumadin 2  Coumadin  [address]   [place], [place] [address]   Phone: [phone]  Fax: [phone]     September 06, 2010 MRN: [id number]   Phillip Nolan [address] [place], [place]  [address]   Dear Mr. MABEY,  We have attempted to contact you by phone and letter on multiple occasions to contact our office for important blood work associated with the blood thinner, warfarin (Coumadin).  Warfarin is a very important drug that can cause life threatening side effects including, bleeding, and thus requires close laboratory monitoring.  We are unable to accept responsibility for blood thinner-related health problems you may develop because you have not followed our recommendations for appropriate monitoring.  These may include abnormal bleeding occurrences and/or development of blood clots (stroke, heart attack, blood clots in legs or lungs, etc.).  We need for you to contact this office at the number listed above to schedule and complete this very important blood work.  Thank you for your assistance in this urgent matter.  Sincerely,  [facility] Team

## 2010-09-30 ENCOUNTER — Encounter: Payer: Self-pay | Admitting: Pharmacist

## 2010-10-05 NOTE — Letter (Signed)
Summary: [facility]ent Coumadin 2  [facility]  [address]   [place], [place] [address]   Phone: [phone]  Fax: [phone]     September 30, 2010 MRN: [id number]   MAKOA SATZ [address] [place], [place]  [address]   Dear Mr. REHFELD,  We have attempted to contact you by phone and letter on multiple occasions to contact our office for important blood work associated with the blood thinner, warfarin (Coumadin).  Warfarin is a very important drug that can cause life threatening side effects including, bleeding, and thus requires close laboratory monitoring.  We are unable to accept responsibility for blood thinner-related health problems you may develop because you have not followed our recommendations for appropriate monitoring.  These may include abnormal bleeding occurrences and/or development of blood clots (stroke, heart attack, blood clots in legs or lungs, etc.).  We need for you to contact this office at the number listed above to schedule and complete this very important blood work.  Thank you for your assistance in this urgent matter.  Sincerely, Vashti Hey RN [facility]ovascular [facility] Reduction [facility] Team  Please call our office and let us know if you are no longer taking coumadin.

## 2010-10-06 LAB — BASIC METABOLIC PANEL
BUN: 12 mg/dL (ref 6–23)
BUN: 16 mg/dL (ref 6–23)
BUN: 22 mg/dL (ref 6–23)
BUN: 23 mg/dL (ref 6–23)
BUN: 27 mg/dL — ABNORMAL HIGH (ref 6–23)
CO2: 27 mEq/L (ref 19–32)
CO2: 29 mEq/L (ref 19–32)
CO2: 29 mEq/L (ref 19–32)
CO2: 31 mEq/L (ref 19–32)
CO2: 32 mEq/L (ref 19–32)
CO2: 33 mEq/L — ABNORMAL HIGH (ref 19–32)
Calcium: 8.4 mg/dL (ref 8.4–10.5)
Calcium: 8.6 mg/dL (ref 8.4–10.5)
Calcium: 8.6 mg/dL (ref 8.4–10.5)
Calcium: 9.1 mg/dL (ref 8.4–10.5)
Chloride: 101 mEq/L (ref 96–112)
Chloride: 103 mEq/L (ref 96–112)
Chloride: 103 mEq/L (ref 96–112)
Chloride: 104 mEq/L (ref 96–112)
Chloride: 105 mEq/L (ref 96–112)
Chloride: 105 mEq/L (ref 96–112)
Chloride: 106 mEq/L (ref 96–112)
Creatinine, Ser: 0.92 mg/dL (ref 0.4–1.5)
Creatinine, Ser: 0.99 mg/dL (ref 0.4–1.5)
Creatinine, Ser: 1 mg/dL (ref 0.4–1.5)
Creatinine, Ser: 1.02 mg/dL (ref 0.4–1.5)
Creatinine, Ser: 1.03 mg/dL (ref 0.4–1.5)
Creatinine, Ser: 1.04 mg/dL (ref 0.4–1.5)
Creatinine, Ser: 1.07 mg/dL (ref 0.4–1.5)
GFR calc Af Amer: >60 mL/min (ref >60)
GFR calc Af Amer: >60 mL/min (ref >60)
GFR calc Af Amer: >60 mL/min (ref >60)
GFR calc Af Amer: >60 mL/min (ref >60)
GFR calc Af Amer: >60 mL/min (ref >60)
GFR calc Af Amer: >60 mL/min (ref >60)
GFR calc non Af Amer: >60 mL/min (ref >60)
GFR calc non Af Amer: >60 mL/min (ref >60)
GFR calc non Af Amer: >60 mL/min (ref >60)
GFR calc non Af Amer: >60 mL/min (ref >60)
GFR calc non Af Amer: >60 mL/min (ref >60)
Glucose, Bld: 103 mg/dL — ABNORMAL HIGH (ref 70–99)
Glucose, Bld: 108 mg/dL — ABNORMAL HIGH (ref 70–99)
Glucose, Bld: 87 mg/dL (ref 70–99)
Glucose, Bld: 95 mg/dL (ref 70–99)
Potassium: 3.6 mEq/L (ref 3.5–5.1)
Potassium: 3.8 mEq/L (ref 3.5–5.1)
Potassium: 4.3 mEq/L (ref 3.5–5.1)
Potassium: 4.5 mEq/L (ref 3.5–5.1)
Potassium: 4.5 mEq/L (ref 3.5–5.1)
Sodium: 137 mEq/L (ref 135–145)
Sodium: 138 mEq/L (ref 135–145)
Sodium: 139 mEq/L (ref 135–145)
Sodium: 140 mEq/L (ref 135–145)
Sodium: 141 mEq/L (ref 135–145)
Sodium: 142 mEq/L (ref 135–145)

## 2010-10-06 LAB — POCT I-STAT 3, VENOUS BLOOD GAS (G3P V)
Bicarbonate: 26.8 mEq/L — ABNORMAL HIGH (ref 20.0–24.0)
O2 Saturation: 62 %
O2 Saturation: 81 %
TCO2: 28 mmol/L (ref 0–100)
pCO2, Ven: 45 mmHg (ref 45.0–50.0)
pCO2, Ven: 48.1 mmHg (ref 45.0–50.0)
pH, Ven: 7.359 — ABNORMAL HIGH (ref 7.250–7.300)
pO2, Ven: 34 mmHg (ref 30.0–45.0)
pO2, Ven: 46 mmHg — ABNORMAL HIGH (ref 30.0–45.0)

## 2010-10-06 LAB — POCT I-STAT 4, (NA,K, GLUC, HGB,HCT)
Glucose, Bld: 119 mg/dL — ABNORMAL HIGH (ref 70–99)
Glucose, Bld: 122 mg/dL — ABNORMAL HIGH (ref 70–99)
Glucose, Bld: 135 mg/dL — ABNORMAL HIGH (ref 70–99)
Glucose, Bld: 139 mg/dL — ABNORMAL HIGH (ref 70–99)
HCT: 26 % — ABNORMAL LOW (ref 39.0–52.0)
HCT: 27 % — ABNORMAL LOW (ref 39.0–52.0)
HCT: 31 % — ABNORMAL LOW (ref 39.0–52.0)
HCT: 33 % — ABNORMAL LOW (ref 39.0–52.0)
Hemoglobin: 10.2 g/dL — ABNORMAL LOW (ref 13.0–17.0)
Hemoglobin: 10.5 g/dL — ABNORMAL LOW (ref 13.0–17.0)
Hemoglobin: 11.2 g/dL — ABNORMAL LOW (ref 13.0–17.0)
Hemoglobin: 12.2 g/dL — ABNORMAL LOW (ref 13.0–17.0)
Hemoglobin: 8.8 g/dL — ABNORMAL LOW (ref 13.0–17.0)
Hemoglobin: 9.2 g/dL — ABNORMAL LOW (ref 13.0–17.0)
Potassium: 3.6 mEq/L (ref 3.5–5.1)
Potassium: 3.7 mEq/L (ref 3.5–5.1)
Potassium: 4.4 mEq/L (ref 3.5–5.1)
Sodium: 132 mEq/L — ABNORMAL LOW (ref 135–145)
Sodium: 136 mEq/L (ref 135–145)
Sodium: 137 mEq/L (ref 135–145)
Sodium: 138 mEq/L (ref 135–145)
Sodium: 138 mEq/L (ref 135–145)
Sodium: 140 mEq/L (ref 135–145)

## 2010-10-06 LAB — POCT I-STAT GLUCOSE: Operator id: 286331

## 2010-10-06 LAB — URINE CULTURE: Colony Count: >=100000

## 2010-10-06 LAB — POCT I-STAT 3, ART BLOOD GAS (G3+)
Acid-Base Excess: 6 mmol/L — ABNORMAL HIGH (ref 0.0–2.0)
Acid-base deficit: 2 mmol/L (ref 0.0–2.0)
Acid-base deficit: 2 mmol/L (ref 0.0–2.0)
Acid-base deficit: 3 mmol/L — ABNORMAL HIGH (ref 0.0–2.0)
O2 Saturation: 96 %
O2 Saturation: 97 %
O2 Saturation: 99 %
Patient temperature: 34.9
Patient temperature: 36.5
TCO2: 25 mmol/L (ref 0–100)
TCO2: 27 mmol/L (ref 0–100)
TCO2: 28 mmol/L (ref 0–100)
pCO2 arterial: 38.4 mmHg (ref 35.0–45.0)
pCO2 arterial: 38.6 mmHg (ref 35.0–45.0)
pCO2 arterial: 39.1 mmHg (ref 35.0–45.0)
pCO2 arterial: 39.5 mmHg (ref 35.0–45.0)
pCO2 arterial: 40.5 mmHg (ref 35.0–45.0)
pH, Arterial: 7.265 — ABNORMAL LOW (ref 7.350–7.450)
pH, Arterial: 7.371 (ref 7.350–7.450)
pH, Arterial: 7.431 (ref 7.350–7.450)
pH, Arterial: 7.439 (ref 7.350–7.450)
pO2, Arterial: 189 mmHg — ABNORMAL HIGH (ref 80.0–100.0)
pO2, Arterial: 411 mmHg — ABNORMAL HIGH (ref 80.0–100.0)
pO2, Arterial: 76 mmHg — ABNORMAL LOW (ref 80.0–100.0)
pO2, Arterial: 79 mmHg — ABNORMAL LOW (ref 80.0–100.0)
pO2, Arterial: 81 mmHg (ref 80.0–100.0)

## 2010-10-06 LAB — POCT I-STAT, CHEM 8
BUN: 17 mg/dL (ref 6–23)
Chloride: 102 mEq/L (ref 96–112)
Creatinine, Ser: 0.9 mg/dL (ref 0.4–1.5)
Creatinine, Ser: 1.1 mg/dL (ref 0.4–1.5)
HCT: 26 % — ABNORMAL LOW (ref 39.0–52.0)
Hemoglobin: 8.8 g/dL — ABNORMAL LOW (ref 13.0–17.0)
Sodium: 140 mEq/L (ref 135–145)
Sodium: 140 mEq/L (ref 135–145)
TCO2: 25 mmol/L (ref 0–100)
TCO2: 28 mmol/L (ref 0–100)

## 2010-10-06 LAB — CBC
HCT: 23.1 % — ABNORMAL LOW (ref 39.0–52.0)
HCT: 24.3 % — ABNORMAL LOW (ref 39.0–52.0)
HCT: 25.7 % — ABNORMAL LOW (ref 39.0–52.0)
HCT: 36.7 % — ABNORMAL LOW (ref 39.0–52.0)
HCT: 37.5 % — ABNORMAL LOW (ref 39.0–52.0)
HCT: 37.7 % — ABNORMAL LOW (ref 39.0–52.0)
HCT: 38.1 % — ABNORMAL LOW (ref 39.0–52.0)
HCT: 38.5 % — ABNORMAL LOW (ref 39.0–52.0)
Hemoglobin: 12.1 g/dL — ABNORMAL LOW (ref 13.0–17.0)
Hemoglobin: 12.2 g/dL — ABNORMAL LOW (ref 13.0–17.0)
Hemoglobin: 12.4 g/dL — ABNORMAL LOW (ref 13.0–17.0)
Hemoglobin: 12.6 g/dL — ABNORMAL LOW (ref 13.0–17.0)
Hemoglobin: 7.8 g/dL — ABNORMAL LOW (ref 13.0–17.0)
Hemoglobin: 8.1 g/dL — ABNORMAL LOW (ref 13.0–17.0)
Hemoglobin: 9.2 g/dL — ABNORMAL LOW (ref 13.0–17.0)
MCHC: 33.1 g/dL (ref 30.0–36.0)
MCHC: 33.1 g/dL (ref 30.0–36.0)
MCHC: 33.2 g/dL (ref 30.0–36.0)
MCHC: 33.5 g/dL (ref 30.0–36.0)
MCHC: 33.7 g/dL (ref 30.0–36.0)
MCHC: 33.8 g/dL (ref 30.0–36.0)
MCHC: 33.8 g/dL (ref 30.0–36.0)
MCHC: 34 g/dL (ref 30.0–36.0)
MCHC: 34.1 g/dL (ref 30.0–36.0)
MCV: 94.2 fL (ref 78.0–100.0)
MCV: 94.5 fL (ref 78.0–100.0)
MCV: 94.7 fL (ref 78.0–100.0)
MCV: 94.7 fL (ref 78.0–100.0)
MCV: 94.7 fL (ref 78.0–100.0)
MCV: 95 fL (ref 78.0–100.0)
MCV: 96.3 fL (ref 78.0–100.0)
MCV: 96.5 fL (ref 78.0–100.0)
MCV: 96.8 fL (ref 78.0–100.0)
MCV: 97.1 fL (ref 78.0–100.0)
Platelets: 103 10*3/uL — ABNORMAL LOW (ref 150–400)
Platelets: 127 10*3/uL — ABNORMAL LOW (ref 150–400)
Platelets: 132 10*3/uL — ABNORMAL LOW (ref 150–400)
Platelets: 80 10*3/uL — ABNORMAL LOW (ref 150–400)
Platelets: 90 10*3/uL — ABNORMAL LOW (ref 150–400)
Platelets: 97 10*3/uL — ABNORMAL LOW (ref 150–400)
Platelets: 98 10*3/uL — ABNORMAL LOW (ref 150–400)
Platelets: 99 10*3/uL — ABNORMAL LOW (ref 150–400)
RBC: 2.42 MIL/uL — ABNORMAL LOW (ref 4.22–5.81)
RBC: 2.57 MIL/uL — ABNORMAL LOW (ref 4.22–5.81)
RBC: 2.63 MIL/uL — ABNORMAL LOW (ref 4.22–5.81)
RBC: 2.71 MIL/uL — ABNORMAL LOW (ref 4.22–5.81)
RBC: 2.88 MIL/uL — ABNORMAL LOW (ref 4.22–5.81)
RBC: 3.26 MIL/uL — ABNORMAL LOW (ref 4.22–5.81)
RBC: 3.77 MIL/uL — ABNORMAL LOW (ref 4.22–5.81)
RBC: 3.92 MIL/uL — ABNORMAL LOW (ref 4.22–5.81)
RBC: 3.96 MIL/uL — ABNORMAL LOW (ref 4.22–5.81)
RDW: 16.6 % — ABNORMAL HIGH (ref 11.5–15.5)
RDW: 16.7 % — ABNORMAL HIGH (ref 11.5–15.5)
RDW: 16.7 % — ABNORMAL HIGH (ref 11.5–15.5)
WBC: 5.7 10*3/uL (ref 4.0–10.5)
WBC: 6.3 10*3/uL (ref 4.0–10.5)
WBC: 7 10*3/uL (ref 4.0–10.5)
WBC: 7.3 10*3/uL (ref 4.0–10.5)
WBC: 7.7 10*3/uL (ref 4.0–10.5)
WBC: 8.2 10*3/uL (ref 4.0–10.5)
WBC: 8.5 10*3/uL (ref 4.0–10.5)
WBC: 8.6 10*3/uL (ref 4.0–10.5)
WBC: 9.8 10*3/uL (ref 4.0–10.5)

## 2010-10-06 LAB — CROSSMATCH
ABO/RH(D): O NEG
ABO/RH(D): O NEG
Antibody Screen: NEGATIVE

## 2010-10-06 LAB — BLOOD GAS, ARTERIAL
Bicarbonate: 25.2 mEq/L — ABNORMAL HIGH (ref 20.0–24.0)
O2 Saturation: 94.2 %
pCO2 arterial: 36.1 mmHg (ref 35.0–45.0)
pH, Arterial: 7.459 — ABNORMAL HIGH (ref 7.350–7.450)
pO2, Arterial: 64.6 mmHg — ABNORMAL LOW (ref 80.0–100.0)

## 2010-10-06 LAB — URINALYSIS, MICROSCOPIC ONLY
Glucose, UA: NEGATIVE mg/dL
Specific Gravity, Urine: 1.008 (ref 1.005–1.030)
pH: 7.5 (ref 5.0–8.0)

## 2010-10-06 LAB — PREPARE FRESH FROZEN PLASMA

## 2010-10-06 LAB — URINE MICROSCOPIC-ADD ON

## 2010-10-06 LAB — PLATELET COUNT: Platelets: 60 10*3/uL — ABNORMAL LOW (ref 150–400)

## 2010-10-06 LAB — URINALYSIS, ROUTINE W REFLEX MICROSCOPIC
Nitrite: NEGATIVE
Protein, ur: 30 mg/dL — AB
Specific Gravity, Urine: 1.012 (ref 1.005–1.030)
Urobilinogen, UA: 4 mg/dL — ABNORMAL HIGH (ref 0.0–1.0)

## 2010-10-06 LAB — CREATININE, SERUM
Creatinine, Ser: 0.89 mg/dL (ref 0.4–1.5)
GFR calc Af Amer: >60 mL/min (ref >60)
GFR calc Af Amer: >60 mL/min (ref >60)

## 2010-10-06 LAB — GLUCOSE, CAPILLARY
Glucose-Capillary: 106 mg/dL — ABNORMAL HIGH (ref 70–99)
Glucose-Capillary: 111 mg/dL — ABNORMAL HIGH (ref 70–99)
Glucose-Capillary: 114 mg/dL — ABNORMAL HIGH (ref 70–99)
Glucose-Capillary: 117 mg/dL — ABNORMAL HIGH (ref 70–99)
Glucose-Capillary: 126 mg/dL — ABNORMAL HIGH (ref 70–99)
Glucose-Capillary: 128 mg/dL — ABNORMAL HIGH (ref 70–99)
Glucose-Capillary: 130 mg/dL — ABNORMAL HIGH (ref 70–99)
Glucose-Capillary: 138 mg/dL — ABNORMAL HIGH (ref 70–99)
Glucose-Capillary: 142 mg/dL — ABNORMAL HIGH (ref 70–99)
Glucose-Capillary: 151 mg/dL — ABNORMAL HIGH (ref 70–99)
Glucose-Capillary: 151 mg/dL — ABNORMAL HIGH (ref 70–99)
Glucose-Capillary: 160 mg/dL — ABNORMAL HIGH (ref 70–99)
Glucose-Capillary: 163 mg/dL — ABNORMAL HIGH (ref 70–99)
Glucose-Capillary: 81 mg/dL (ref 70–99)

## 2010-10-06 LAB — MAGNESIUM
Magnesium: 2.6 mg/dL — ABNORMAL HIGH (ref 1.5–2.5)
Magnesium: 2.6 mg/dL — ABNORMAL HIGH (ref 1.5–2.5)
Magnesium: 2.7 mg/dL — ABNORMAL HIGH (ref 1.5–2.5)

## 2010-10-06 LAB — HEPARIN INDUCED THROMBOCYTOPENIA PNL
UFH Low Dose 0.1 IU/mL: 2 % Release
UFH Low Dose 0.5 IU/mL: 1 % Release
UFH SRA Result: NEGATIVE

## 2010-10-06 LAB — PROTIME-INR
INR: 1.23 (ref 0.00–1.49)
INR: 1.37 (ref 0.00–1.49)
INR: 1.47 (ref 0.00–1.49)
INR: 1.6 — ABNORMAL HIGH (ref 0.00–1.49)
INR: 1.84 — ABNORMAL HIGH (ref 0.00–1.49)
Prothrombin Time: 15.4 seconds — ABNORMAL HIGH (ref 11.6–15.2)
Prothrombin Time: 16.6 seconds — ABNORMAL HIGH (ref 11.6–15.2)
Prothrombin Time: 16.8 seconds — ABNORMAL HIGH (ref 11.6–15.2)
Prothrombin Time: 17.7 seconds — ABNORMAL HIGH (ref 11.6–15.2)
Prothrombin Time: 18.9 seconds — ABNORMAL HIGH (ref 11.6–15.2)

## 2010-10-06 LAB — PREPARE CRYOPRECIPITATE

## 2010-10-06 LAB — LIPID PANEL: HDL: 27 mg/dL — ABNORMAL LOW (ref >39)

## 2010-10-06 LAB — COMPREHENSIVE METABOLIC PANEL
Alkaline Phosphatase: 71 U/L (ref 39–117)
BUN: 13 mg/dL (ref 6–23)
Chloride: 105 mEq/L (ref 96–112)
Creatinine, Ser: 1.03 mg/dL (ref 0.4–1.5)
Glucose, Bld: 122 mg/dL — ABNORMAL HIGH (ref 70–99)
Potassium: 4.1 mEq/L (ref 3.5–5.1)
Total Bilirubin: 2.1 mg/dL — ABNORMAL HIGH (ref 0.3–1.2)

## 2010-10-06 LAB — PREPARE PLATELETS

## 2010-10-06 LAB — HEMOGLOBIN A1C
Hgb A1c MFr Bld: 5.5 % (ref 4.6–6.1)
Mean Plasma Glucose: 111 mg/dL

## 2010-10-06 LAB — HEPARIN LEVEL (UNFRACTIONATED)
Heparin Unfractionated: 0.38 IU/mL (ref 0.30–0.70)
Heparin Unfractionated: <0.1 IU/mL — ABNORMAL LOW (ref 0.30–0.70)

## 2010-10-06 LAB — HEMOGLOBIN AND HEMATOCRIT, BLOOD: Hemoglobin: 9.4 g/dL — ABNORMAL LOW (ref 13.0–17.0)

## 2010-10-06 LAB — ABO/RH: ABO/RH(D): O NEG

## 2010-10-06 LAB — TRANSFUSION REACTION: Post RXN DAT IgG: NEGATIVE

## 2010-10-11 LAB — BASIC METABOLIC PANEL
Chloride: 99 mEq/L (ref 96–112)
Creatinine, Ser: 1.18 mg/dL (ref 0.4–1.5)
GFR calc Af Amer: >60 mL/min (ref >60)
Potassium: 3.9 mEq/L (ref 3.5–5.1)
Sodium: 139 mEq/L (ref 135–145)

## 2010-10-11 LAB — COMPREHENSIVE METABOLIC PANEL
ALT: 73 U/L — ABNORMAL HIGH (ref 0–53)
AST: 50 U/L — ABNORMAL HIGH (ref 0–37)
Albumin: 3.4 g/dL — ABNORMAL LOW (ref 3.5–5.2)
Alkaline Phosphatase: 84 U/L (ref 39–117)
CO2: 35 mEq/L — ABNORMAL HIGH (ref 19–32)
Chloride: 98 mEq/L (ref 96–112)
GFR calc Af Amer: 59 mL/min — ABNORMAL LOW (ref >60)
Potassium: 4 mEq/L (ref 3.5–5.1)
Total Bilirubin: 3.5 mg/dL — ABNORMAL HIGH (ref 0.3–1.2)

## 2010-10-11 LAB — HEMOGLOBIN AND HEMATOCRIT, BLOOD: HCT: 41.2 % (ref 39.0–52.0)

## 2010-10-11 LAB — CBC
RBC: 4.65 MIL/uL (ref 4.22–5.81)
WBC: 7.7 10*3/uL (ref 4.0–10.5)

## 2010-10-11 LAB — BRAIN NATRIURETIC PEPTIDE: Pro B Natriuretic peptide (BNP): 761 pg/mL — ABNORMAL HIGH (ref 0.0–100.0)

## 2010-11-01 ENCOUNTER — Encounter (INDEPENDENT_AMBULATORY_CARE_PROVIDER_SITE_OTHER): Payer: Medicare Other | Admitting: Thoracic Surgery (Cardiothoracic Vascular Surgery)

## 2010-11-01 DIAGNOSIS — I059 Rheumatic mitral valve disease, unspecified: Secondary | ICD-10-CM

## 2010-11-01 DIAGNOSIS — I359 Nonrheumatic aortic valve disorder, unspecified: Secondary | ICD-10-CM

## 2010-11-02 NOTE — Assessment & Plan Note (Signed)
OFFICE VISIT  AYEDEN, GLADMAN [facility] DOB:  [birth year]                                        November 01, 2010 CHART #:  [id number]  The patient returns today for social visit.  He is doing remarkably well and feels great.  He did have to have a skin cancer removed from his right wrist recently, but from a physical standpoint he is otherwise getting along exceptionally well.  He has no symptoms of heart failure whatsoever and his exercise tolerance is quite good.  He does not have a murmur on exam.  He has no complaints.  He will return as needed.  Salvatore Decent. Cornelius Moras, M.D. Electronically Signed  CHO/MEDQ  D:  11/01/2010  T:  11/02/2010  Job:  [id number]  cc:   Jonelle Sidle, MD

## 2010-11-30 ENCOUNTER — Ambulatory Visit (INDEPENDENT_AMBULATORY_CARE_PROVIDER_SITE_OTHER): Payer: Medicare Other | Admitting: *Deleted

## 2010-11-30 DIAGNOSIS — I4891 Unspecified atrial fibrillation: Secondary | ICD-10-CM

## 2010-11-30 DIAGNOSIS — I359 Nonrheumatic aortic valve disorder, unspecified: Secondary | ICD-10-CM

## 2010-11-30 DIAGNOSIS — Z7901 Long term (current) use of anticoagulants: Secondary | ICD-10-CM

## 2010-11-30 DIAGNOSIS — I059 Rheumatic mitral valve disease, unspecified: Secondary | ICD-10-CM

## 2010-11-30 LAB — POCT INR: INR: 1.6

## 2010-11-30 MED ORDER — WARFARIN SODIUM 5 MG PO TABS: 5.0000 mg | ORAL_TABLET | ORAL | Status: DC

## 2010-11-30 MED ORDER — ATENOLOL 25 MG PO TABS: 25.0000 mg | ORAL_TABLET | Freq: Every day | ORAL | Status: DC

## 2010-11-30 NOTE — Assessment & Plan Note (Signed)
OFFICE VISIT   WINFIELD, CABA [facility]  DOB:  09/30/1927                                        December 21, 2009  CHART #:  [id number]   HISTORY:  The patient returns for routine followup status post aortic  valve replacement and mitral valve repair on October 23, 2009.  He was last  seen here in the office on Nov 16, 2009, at which time he was progressing  well.  Since then, the patient has continued to do remarkably well.  He  returns to the office today with his family, and he reports feeling  exceptionally good.  He has no shortness of breath.  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  He wants to start swimming at the [employer].  He has minimal  residual soreness in his chest.  His appetite is good.  He is sleeping  well at night.  He has absolutely no complaints.  He remains on  Coumadin, and his prothrombin time continues to be monitored through the  [facility] office in [place].  He is scheduled to see Dr. Diona Browner  for follow up next week.   PHYSICAL EXAMINATION:  Notable for a well-appearing male with blood  pressure 154/82, pulse 76 and regular, oxygen saturation is 95% on room  air.  Examination of the chest is notable for median sternotomy incision  that is healing nicely.  The sternum is stable on palpation.  Auscultation reveals clear breath sounds that are symmetrical  bilaterally.  No wheezes, rales, or rhonchi noted.  Cardiovascular exam  is notable for regular rate and rhythm.  No murmurs, rubs, or gallops  are appreciated.  The abdomen is soft, nontender.  The extremities are  warm and well perfused.  There is no lower extremity edema.  The  remainder of his physical exam is unrevealing.   DIAGNOSTIC TESTS:  Chest x-ray performed today at the [facility] is reviewed.  This demonstrates stable cardiomegaly.  There are  no pleural effusions.  There is no pulmonary edema.   All the sternal  wires appear intact and unchanged.   IMPRESSION:  The patient looks remarkably good.   PLAN:  We will see the patient back for routine followup in 3 months.  If he continues to do well and his rhythm remains stable, he could come  off Coumadin at some point within the next month or two.  We have not  made any changes in his current medications.  I have encouraged him to  continue to gradually increase his physical activity as tolerated.  All  of his questions have been addressed.   Salvatore Decent. Cornelius Moras, M.D.  Electronically Signed   CHO/MEDQ  D:  12/21/2009  T:  12/22/2009  Job:  [id number]   cc:   Verne Carrow, MD  Jonelle Sidle, MD  Hillis Range, MD  Madolyn Frieze Jens Som, MD, [facility]

## 2010-11-30 NOTE — Assessment & Plan Note (Signed)
OFFICE VISIT   KIEFER, OPHEIM [facility]  DOB:  [birth year]                                        April 26, 2010  CHART #:  [id number]   HISTORY OF PRESENT ILLNESS:  The patient returns for followup status  post aortic valve replacement and mitral valve repair on October 23, 2009.  He was last seen here in the office on December 21, 2009.  Since then, he has  continued to do extremely well.  He returns to the office today with his  son.  He is getting along quite well and reports no problems whatsoever.  He has no pain at all.  He has no shortness of breath and he reports he  is feeling better than he has in a long time.  He denies any tachy  palpitations or dizzy spells.  He has no complaints.  The remainder of  his past medical history is unchanged.   CURRENT MEDICATIONS:  Atenolol, aspirin, Coumadin.   PHYSICAL EXAMINATION:  Notable for a well-appearing elderly male with  blood pressure 185/56, pulse 51, and oxygen saturation 99% on room air.  Examination of the chest reveals a well-healed median sternotomy scar.  The sternum is stable.  Auscultation reveals clear breath sounds that  are symmetrical bilaterally.  No wheezes, rales, or rhonchi are noted.  Heart sounds are distant.  There is a regular rate and rhythm.  No  murmurs, rubs, or gallops are noted.  The abdomen is soft and nontender.  The extremities are warm and well perfused.  There is no lower extremity  edema.   IMPRESSION:  The patient seems to be doing remarkably well more than 6  months status post aortic valve replacement and mitral valve repair.  He  has a history of atrial fibrillation and he remains on Coumadin.  At  this point, his only indication for long-term anticoagulation is that  related to atrial fibrillation.  Functionally, the patient is doing  remarkably well.  His blood pressure is up a little bit here in the  office today.   PLAN:  We will leave any subsequent decision making  regarding long-term  adjustment of antihypertensive medications to Dr. Diona Browner and  colleagues.  Similarly, the patient was asked about getting off of  Coumadin.  His only indication for Coumadin at this time is that related  to his long history of atrial fibrillation.  He is also asked as to  whether or not he might be a candidate for long-term Pradaxa therapy  instead of Coumadin.  We will defer any long-term decision making to Dr.  Diona Browner.  The patient wants to come back to see [place] again in 6 months to  see how he is coming along.  Overall, he is doing remarkably well.  He  has no surgical issues at this time.   Salvatore Decent. Cornelius Moras, M.D.  Electronically Signed   CHO/MEDQ  D:  04/26/2010  T:  04/27/2010  Job:  [id number]   cc:   Jonelle Sidle, MD  Hillis Range, MD  Verne Carrow, MD  Madolyn Frieze. Jens Som, MD, [facility]

## 2010-11-30 NOTE — Assessment & Plan Note (Signed)
OFFICE VISIT   DELTA, DESHMUKH [facility]  DOB:  03-Oct-1927                                        Nov 16, 2009  CHART #:  [id number]   HISTORY OF PRESENT ILLNESS:  The patient returns for routine followup  status post aortic valve replacement and mitral valve repair on October 23, 2009.  By his advanced age and multiple comorbid medical problems, the  patient has done remarkably well.  He returns to the office today with  his son and daughter-in-law.  He reportedly has been getting along quite  well.  He is walking more and more every day and the home health  physical therapists have been working with him.  He has only mild  residual soreness in his chest and this typically only bothers him when  he coughs.  He denies any sensation of motion or clicking of the  sternum.  He reportedly is eating well.  He is sleeping well at night.  He has no shortness of breath.  He has had some mild intermittent  episodes of confusion, but this continues to seem to be gradually  improving.  Overall, he is delighted with his progress and he is getting  along without any complications.  Home health nurses have been drawing  prothrombin times and his Coumadin dose has been adjusted and monitored  through the [facility] office in [place].  He is scheduled to see  Dr. Diona Browner for followup later this week.  His medications otherwise  remain unchanged from the time of hospital discharge.   PHYSICAL EXAMINATION:  Notable for a well-appearing elderly male with  blood pressure 112/72, pulse 77 and regular, and oxygen saturation 97%.  Examination of the chest reveals a median sternotomy incision that is  healing nicely.  The sternum feels stable on palpation.  Auscultation of  the chest reveals remarkably clear breath sounds, which are symmetrical.  No wheezes, rales, or rhonchi are noted.  Cardiovascular exam  demonstrates regular rate and rhythm.  The abdomen is soft and  nontender.  The extremities are warm and well perfused.  There is no  lower extremity edema in fact the patient appears to be almost slightly  dehydrated based upon skin turgor.   DIAGNOSTIC TESTS:  Chest x-ray performed at [facility]  in [place] on November 11, 2009 is reviewed.  This demonstrates clear lung  fields bilaterally.  There are trivial pleural effusions.  The sternal  wires appear somewhat asymmetrical and raise a question of possible  sternal dehiscence, but this may simply be related to rotation of the  film.   IMPRESSION:  The patient appears to be doing quite well.  On physical  exam, I do not feel any signs to suggest that he has pulled his wires  through the bone and developed sternal dehiscence.  He denies any pain  and the sternum feels stable.  I suspect that x-ray findings are simply  related to poor technique at the time of the x-ray.  Otherwise, the  patient appears to be doing well and may in fact be almost slightly  dehydrated.   PLAN:  I have instructed the patient to stop taking Lasix and potassium.  We have otherwise not made any significant changes.  I have encouraged  him to continue to increase his physical activity as  tolerated, but to  remain very careful about avoiding any heavy lifting or strenuous use of  his arms or shoulders.  We will see him back in 5 weeks' time with a  repeat chest x-ray.   Salvatore Decent. Cornelius Moras, M.D.  Electronically Signed   CHO/MEDQ  D:  11/16/2009  T:  11/17/2009  Job:  [id number]   cc:   Jonelle Sidle, MD  Hillis Range, MD  Verne Carrow, MD  Charlynne Pander, D.D.S.  Madolyn Frieze Jens Som, MD, [facility]

## 2010-12-14 ENCOUNTER — Encounter: Payer: Medicare Other | Admitting: *Deleted

## 2010-12-24 ENCOUNTER — Encounter: Payer: Self-pay | Admitting: Cardiology

## 2010-12-28 ENCOUNTER — Ambulatory Visit: Payer: Medicare Other | Admitting: Cardiology

## 2011-04-27 ENCOUNTER — Other Ambulatory Visit: Payer: Self-pay | Admitting: Cardiology

## 2011-04-27 MED ORDER — WARFARIN SODIUM 5 MG PO TABS: 5.0000 mg | ORAL_TABLET | Freq: Every day | ORAL | Status: DC

## 2011-05-06 ENCOUNTER — Ambulatory Visit (INDEPENDENT_AMBULATORY_CARE_PROVIDER_SITE_OTHER): Payer: Medicare Other | Admitting: *Deleted

## 2011-05-06 DIAGNOSIS — I359 Nonrheumatic aortic valve disorder, unspecified: Secondary | ICD-10-CM

## 2011-05-06 DIAGNOSIS — Z7901 Long term (current) use of anticoagulants: Secondary | ICD-10-CM

## 2011-05-06 DIAGNOSIS — I059 Rheumatic mitral valve disease, unspecified: Secondary | ICD-10-CM

## 2011-05-06 DIAGNOSIS — I4891 Unspecified atrial fibrillation: Secondary | ICD-10-CM

## 2011-05-06 LAB — POCT INR: INR: 1.3

## 2011-05-17 ENCOUNTER — Encounter: Payer: Medicare Other | Admitting: *Deleted

## 2011-05-20 ENCOUNTER — Ambulatory Visit (INDEPENDENT_AMBULATORY_CARE_PROVIDER_SITE_OTHER): Payer: Medicare Other | Admitting: *Deleted

## 2011-05-20 DIAGNOSIS — Z7901 Long term (current) use of anticoagulants: Secondary | ICD-10-CM

## 2011-05-20 DIAGNOSIS — I4891 Unspecified atrial fibrillation: Secondary | ICD-10-CM

## 2011-05-20 DIAGNOSIS — I359 Nonrheumatic aortic valve disorder, unspecified: Secondary | ICD-10-CM

## 2011-05-20 DIAGNOSIS — I059 Rheumatic mitral valve disease, unspecified: Secondary | ICD-10-CM

## 2011-06-07 ENCOUNTER — Ambulatory Visit (INDEPENDENT_AMBULATORY_CARE_PROVIDER_SITE_OTHER): Payer: Medicare Other | Admitting: *Deleted

## 2011-06-07 DIAGNOSIS — I359 Nonrheumatic aortic valve disorder, unspecified: Secondary | ICD-10-CM

## 2011-06-07 DIAGNOSIS — Z7901 Long term (current) use of anticoagulants: Secondary | ICD-10-CM

## 2011-06-07 DIAGNOSIS — I059 Rheumatic mitral valve disease, unspecified: Secondary | ICD-10-CM

## 2011-06-07 DIAGNOSIS — I4891 Unspecified atrial fibrillation: Secondary | ICD-10-CM

## 2011-06-07 LAB — POCT INR: INR: 2

## 2011-06-07 MED ORDER — ATENOLOL 25 MG PO TABS: 25.0000 mg | ORAL_TABLET | Freq: Every day | ORAL | Status: DC

## 2011-06-28 ENCOUNTER — Encounter: Payer: Medicare Other | Admitting: *Deleted

## 2011-07-01 ENCOUNTER — Ambulatory Visit (INDEPENDENT_AMBULATORY_CARE_PROVIDER_SITE_OTHER): Payer: Medicare Other | Admitting: *Deleted

## 2011-07-01 DIAGNOSIS — Z7901 Long term (current) use of anticoagulants: Secondary | ICD-10-CM

## 2011-07-01 DIAGNOSIS — I059 Rheumatic mitral valve disease, unspecified: Secondary | ICD-10-CM

## 2011-07-01 DIAGNOSIS — I4891 Unspecified atrial fibrillation: Secondary | ICD-10-CM

## 2011-07-01 DIAGNOSIS — I359 Nonrheumatic aortic valve disorder, unspecified: Secondary | ICD-10-CM

## 2011-07-22 ENCOUNTER — Ambulatory Visit (INDEPENDENT_AMBULATORY_CARE_PROVIDER_SITE_OTHER): Payer: Medicare Other | Admitting: *Deleted

## 2011-07-22 DIAGNOSIS — I059 Rheumatic mitral valve disease, unspecified: Secondary | ICD-10-CM

## 2011-07-22 DIAGNOSIS — I359 Nonrheumatic aortic valve disorder, unspecified: Secondary | ICD-10-CM

## 2011-07-22 DIAGNOSIS — Z7901 Long term (current) use of anticoagulants: Secondary | ICD-10-CM | POA: Diagnosis not present

## 2011-07-22 DIAGNOSIS — I4891 Unspecified atrial fibrillation: Secondary | ICD-10-CM

## 2011-07-22 MED ORDER — WARFARIN SODIUM 5 MG PO TABS: ORAL_TABLET | ORAL | Status: DC

## 2011-07-22 MED ORDER — ATENOLOL 25 MG PO TABS: 25.0000 mg | ORAL_TABLET | Freq: Every day | ORAL | Status: DC

## 2011-08-09 ENCOUNTER — Encounter: Payer: Medicare Other | Admitting: *Deleted

## 2011-08-19 ENCOUNTER — Encounter: Payer: Self-pay | Admitting: Cardiology

## 2011-08-19 ENCOUNTER — Ambulatory Visit (INDEPENDENT_AMBULATORY_CARE_PROVIDER_SITE_OTHER): Payer: Medicare Other | Admitting: Cardiology

## 2011-08-19 VITALS — BP 185/88 | HR 53 | Ht 68.0 in | Wt 220.0 lb

## 2011-08-19 DIAGNOSIS — I059 Rheumatic mitral valve disease, unspecified: Secondary | ICD-10-CM

## 2011-08-19 DIAGNOSIS — I359 Nonrheumatic aortic valve disorder, unspecified: Secondary | ICD-10-CM

## 2011-08-19 DIAGNOSIS — I4891 Unspecified atrial fibrillation: Secondary | ICD-10-CM | POA: Diagnosis not present

## 2011-08-19 DIAGNOSIS — I10 Essential (primary) hypertension: Secondary | ICD-10-CM

## 2011-08-19 DIAGNOSIS — I2510 Atherosclerotic heart disease of native coronary artery without angina pectoris: Secondary | ICD-10-CM

## 2011-08-19 MED ORDER — LISINOPRIL 5 MG PO TABS: 5.0000 mg | ORAL_TABLET | Freq: Every day | ORAL | Status: DC

## 2011-08-19 NOTE — Assessment & Plan Note (Signed)
Nonobstructive at preoperative catheterization. Followup echocardiogram to reassess LVEF. No active angina.

## 2011-08-19 NOTE — Progress Notes (Signed)
   Clinical Summary Phillip Nolan is a 78 y.o.male presenting for followup. He has not been seen since June 2011, missed his scheduled visits. He has no primary care physician.  Remarkably, he continues to do well. He denies any chest pain or unusual shortness of breath. No sense of palpitations. He states he's been trying to lose weight through diet, also walking on his treadmill regularly. He takes care of his wife who has suffered from strokes.  I reviewed his medications. Blood pressure is elevated today. He did not present for the echocardiogram that we arranged at the last visit. We discussed this today.  He does continue on Coumadin, denies any bleeding problems. Follows in the Coumadin clinic.   Allergies  Allergen Reactions  . Penicillins Rash    Current Outpatient Prescriptions  Medication Sig Dispense Refill  . aspirin 81 MG EC tablet Take 81 mg by mouth daily.        Marland Kitchen atenolol (TENORMIN) 25 MG tablet Take 1 tablet (25 mg total) by mouth daily.  30 tablet  1  . warfarin (COUMADIN) 5 MG tablet Take 1 tablet daily except 1 1/2 tablet on T,Th,Sat or as directed  30 tablet  3  . lisinopril (PRINIVIL,ZESTRIL) 5 MG tablet Take 1 tablet (5 mg total) by mouth daily.  30 tablet  11    Past Medical History  Diagnosis Date  . 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 Aortic insufficiency     Moderate  . Aortic stenosis     Moderate  . Essential hypertension, benign   . Mitral insufficiency     Severe  . Atrial fibrillation   . BPH (benign prostatic hyperplasia)   . CAD (coronary artery disease)     Nonobstructive, LVEF 35% at cath and 55% ar peri-op TEE    Past Surgical History  Procedure Date  . Median sterntomy 4/11    Dr. Cornelius Moras - AVR (27 mm Edwards pericardial) and MV repair  . Multiple tooth extractions 4/11    (pre-op above)    History reviewed. No pertinent family history.  Social  History Phillip Nolan reports that he quit smoking about 33 years ago. His smoking use included Pipe and Cigars. He has never used smokeless tobacco. Phillip Nolan reports that he does not drink alcohol.  Review of Systems No palpitations or syncope. No falls. No fevers or chills. No orthopnea or PND. Otherwise negative.  Physical Examination Filed Vitals:   08/19/11 1356  BP: 185/88  Pulse: 53   Obese ederly male in no acute distress.  HEENT: Conjunctiva and lids normal, oral pharynx status post multiple extractions, moist mucosa.  Neck: Supple, no JVD or bruits.  Lungs: Clear to auscultation, nonlabored.  Cardiac: Irregularly irregularr rate and rhythm with split S2, soft systolic murmur at the base, no S3.  Abdomen: Soft, nontender, bowel sounds present.  Thorax: Well-healed sternal incision.  Extremities: Venous stasis distally, distal pulses 1+, no pitting edema.  Skin: Warm and dry, scattered ecchymoses on the forearms.  Musculoskeletal: No gross deformities.  Neuropsychiatric: Alert and oriented x3, mildly hard of hearing.   ECG Atrial flutter with 4:1 block, right bundle branch block and left anterior fascicular block.    Problem List and Plan

## 2011-08-19 NOTE — Assessment & Plan Note (Signed)
Status post mitral valve repair with concurrent AVR in April 2011. Followup echocardiogram to be obtained.

## 2011-08-19 NOTE — Assessment & Plan Note (Signed)
Blood pressure elevated. Plan to add Prinivil 5 mg daily back to his regimen. He was on the past. BMET in 2 weeks.

## 2011-08-19 NOTE — Patient Instructions (Signed)
   Echo   Lisinopril 5mg  daily Your physician recommends that you go to the [facility] for lab work for 2 weeks for BMET If the results of your test are normal or stable, you will receive a letter.  If they are abnormal, the nurse will contact you by phone. Your physician wants you to follow up in: 6 months.  You will receive a reminder letter in the mail one-two months in advance.  If you don't receive a letter, please call our office to schedule the follow up appointment

## 2011-08-19 NOTE — Assessment & Plan Note (Signed)
Atrial fibrillation/flutter, maintaining Coumadin therapy. Heart rate is adequately controlled in flutter. No change in present regimen. Continue follow up in the Coumadin clinic.

## 2011-08-19 NOTE — Assessment & Plan Note (Signed)
Status post pericardial tissue AVR in April 2011. Followup echocardiogram to be obtained.

## 2011-08-23 ENCOUNTER — Ambulatory Visit (INDEPENDENT_AMBULATORY_CARE_PROVIDER_SITE_OTHER): Payer: Medicare Other | Admitting: *Deleted

## 2011-08-23 ENCOUNTER — Encounter: Payer: Medicare Other | Admitting: *Deleted

## 2011-08-23 DIAGNOSIS — I059 Rheumatic mitral valve disease, unspecified: Secondary | ICD-10-CM

## 2011-08-23 DIAGNOSIS — I4891 Unspecified atrial fibrillation: Secondary | ICD-10-CM | POA: Diagnosis not present

## 2011-08-23 DIAGNOSIS — Z7901 Long term (current) use of anticoagulants: Secondary | ICD-10-CM

## 2011-08-23 DIAGNOSIS — I359 Nonrheumatic aortic valve disorder, unspecified: Secondary | ICD-10-CM

## 2011-08-23 LAB — POCT INR: INR: 2.6

## 2011-09-16 ENCOUNTER — Encounter: Payer: Self-pay | Admitting: *Deleted

## 2011-09-20 ENCOUNTER — Ambulatory Visit (INDEPENDENT_AMBULATORY_CARE_PROVIDER_SITE_OTHER): Payer: Medicare Other | Admitting: *Deleted

## 2011-09-20 DIAGNOSIS — Z7901 Long term (current) use of anticoagulants: Secondary | ICD-10-CM

## 2011-09-20 DIAGNOSIS — I059 Rheumatic mitral valve disease, unspecified: Secondary | ICD-10-CM | POA: Diagnosis not present

## 2011-09-20 DIAGNOSIS — I359 Nonrheumatic aortic valve disorder, unspecified: Secondary | ICD-10-CM | POA: Diagnosis not present

## 2011-09-20 DIAGNOSIS — I4891 Unspecified atrial fibrillation: Secondary | ICD-10-CM | POA: Diagnosis not present

## 2011-09-28 ENCOUNTER — Other Ambulatory Visit: Payer: Medicare Other

## 2011-10-07 ENCOUNTER — Ambulatory Visit (INDEPENDENT_AMBULATORY_CARE_PROVIDER_SITE_OTHER): Payer: Medicare Other | Admitting: *Deleted

## 2011-10-07 DIAGNOSIS — Z7901 Long term (current) use of anticoagulants: Secondary | ICD-10-CM | POA: Diagnosis not present

## 2011-10-07 DIAGNOSIS — I359 Nonrheumatic aortic valve disorder, unspecified: Secondary | ICD-10-CM | POA: Diagnosis not present

## 2011-10-07 DIAGNOSIS — I059 Rheumatic mitral valve disease, unspecified: Secondary | ICD-10-CM | POA: Diagnosis not present

## 2011-10-07 DIAGNOSIS — I4891 Unspecified atrial fibrillation: Secondary | ICD-10-CM

## 2011-10-12 ENCOUNTER — Other Ambulatory Visit: Payer: Self-pay | Admitting: *Deleted

## 2011-10-12 MED ORDER — ATENOLOL 25 MG PO TABS: 25.0000 mg | ORAL_TABLET | Freq: Every day | ORAL | Status: DC

## 2011-10-12 MED ORDER — WARFARIN SODIUM 5 MG PO TABS: ORAL_TABLET | ORAL | Status: DC

## 2011-10-12 NOTE — Telephone Encounter (Signed)
Phillip Nolan came into office stating that he needs all medications called into [facility]

## 2011-10-13 ENCOUNTER — Other Ambulatory Visit (INDEPENDENT_AMBULATORY_CARE_PROVIDER_SITE_OTHER): Payer: Medicare Other

## 2011-10-13 ENCOUNTER — Other Ambulatory Visit: Payer: Self-pay

## 2011-10-13 DIAGNOSIS — I359 Nonrheumatic aortic valve disorder, unspecified: Secondary | ICD-10-CM | POA: Diagnosis not present

## 2011-10-13 DIAGNOSIS — I2510 Atherosclerotic heart disease of native coronary artery without angina pectoris: Secondary | ICD-10-CM

## 2011-10-13 DIAGNOSIS — I4891 Unspecified atrial fibrillation: Secondary | ICD-10-CM | POA: Diagnosis not present

## 2011-11-04 ENCOUNTER — Ambulatory Visit (INDEPENDENT_AMBULATORY_CARE_PROVIDER_SITE_OTHER): Payer: Medicare Other | Admitting: *Deleted

## 2011-11-04 DIAGNOSIS — I4891 Unspecified atrial fibrillation: Secondary | ICD-10-CM | POA: Diagnosis not present

## 2011-11-04 DIAGNOSIS — Z7901 Long term (current) use of anticoagulants: Secondary | ICD-10-CM | POA: Diagnosis not present

## 2011-11-04 DIAGNOSIS — I059 Rheumatic mitral valve disease, unspecified: Secondary | ICD-10-CM

## 2011-11-04 DIAGNOSIS — I359 Nonrheumatic aortic valve disorder, unspecified: Secondary | ICD-10-CM | POA: Diagnosis not present

## 2011-11-04 LAB — POCT INR: INR: 5.7

## 2011-11-21 ENCOUNTER — Encounter: Payer: Self-pay | Admitting: *Deleted

## 2011-11-21 NOTE — Progress Notes (Signed)
Patient ID: Phillip Nolan, male   DOB: Nov 20, 1927, 78 y.o.   MRN: [id number]  Certified letter mailed to pt reminding him to have FLP/LFT done that was due mid-February.

## 2011-11-25 ENCOUNTER — Ambulatory Visit (INDEPENDENT_AMBULATORY_CARE_PROVIDER_SITE_OTHER): Payer: Medicare Other | Admitting: *Deleted

## 2011-11-25 DIAGNOSIS — I4891 Unspecified atrial fibrillation: Secondary | ICD-10-CM

## 2011-11-25 DIAGNOSIS — I059 Rheumatic mitral valve disease, unspecified: Secondary | ICD-10-CM | POA: Diagnosis not present

## 2011-11-25 DIAGNOSIS — I359 Nonrheumatic aortic valve disorder, unspecified: Secondary | ICD-10-CM | POA: Diagnosis not present

## 2011-11-25 DIAGNOSIS — Z7901 Long term (current) use of anticoagulants: Secondary | ICD-10-CM | POA: Diagnosis not present

## 2011-12-15 NOTE — Progress Notes (Signed)
Received copy of signed certified letter receipt/ patient aware that he needs labs. Labs still unavailable to this date.

## 2011-12-27 ENCOUNTER — Ambulatory Visit (INDEPENDENT_AMBULATORY_CARE_PROVIDER_SITE_OTHER): Payer: Medicare Other | Admitting: *Deleted

## 2011-12-27 DIAGNOSIS — Z7901 Long term (current) use of anticoagulants: Secondary | ICD-10-CM | POA: Diagnosis not present

## 2011-12-27 DIAGNOSIS — I4891 Unspecified atrial fibrillation: Secondary | ICD-10-CM | POA: Diagnosis not present

## 2011-12-27 DIAGNOSIS — I359 Nonrheumatic aortic valve disorder, unspecified: Secondary | ICD-10-CM | POA: Diagnosis not present

## 2011-12-27 DIAGNOSIS — I080 Rheumatic disorders of both mitral and aortic valves: Secondary | ICD-10-CM | POA: Diagnosis not present

## 2011-12-27 DIAGNOSIS — I10 Essential (primary) hypertension: Secondary | ICD-10-CM | POA: Diagnosis not present

## 2011-12-27 DIAGNOSIS — I059 Rheumatic mitral valve disease, unspecified: Secondary | ICD-10-CM | POA: Diagnosis not present

## 2011-12-29 ENCOUNTER — Encounter: Payer: Self-pay | Admitting: *Deleted

## 2012-01-24 ENCOUNTER — Ambulatory Visit (INDEPENDENT_AMBULATORY_CARE_PROVIDER_SITE_OTHER): Payer: Medicare Other | Admitting: *Deleted

## 2012-01-24 DIAGNOSIS — Z7901 Long term (current) use of anticoagulants: Secondary | ICD-10-CM

## 2012-01-24 DIAGNOSIS — I4891 Unspecified atrial fibrillation: Secondary | ICD-10-CM

## 2012-01-24 DIAGNOSIS — I359 Nonrheumatic aortic valve disorder, unspecified: Secondary | ICD-10-CM | POA: Diagnosis not present

## 2012-01-24 DIAGNOSIS — I059 Rheumatic mitral valve disease, unspecified: Secondary | ICD-10-CM | POA: Diagnosis not present

## 2012-01-24 LAB — POCT INR: INR: 2.2

## 2012-02-13 ENCOUNTER — Other Ambulatory Visit: Payer: Self-pay | Admitting: Cardiology

## 2012-03-02 ENCOUNTER — Ambulatory Visit (INDEPENDENT_AMBULATORY_CARE_PROVIDER_SITE_OTHER): Payer: Medicare Other | Admitting: *Deleted

## 2012-03-02 DIAGNOSIS — I359 Nonrheumatic aortic valve disorder, unspecified: Secondary | ICD-10-CM

## 2012-03-02 DIAGNOSIS — I4891 Unspecified atrial fibrillation: Secondary | ICD-10-CM

## 2012-03-02 DIAGNOSIS — I059 Rheumatic mitral valve disease, unspecified: Secondary | ICD-10-CM

## 2012-03-02 DIAGNOSIS — Z7901 Long term (current) use of anticoagulants: Secondary | ICD-10-CM

## 2012-03-30 ENCOUNTER — Ambulatory Visit (INDEPENDENT_AMBULATORY_CARE_PROVIDER_SITE_OTHER): Payer: Medicare Other | Admitting: *Deleted

## 2012-03-30 DIAGNOSIS — I4891 Unspecified atrial fibrillation: Secondary | ICD-10-CM

## 2012-03-30 DIAGNOSIS — Z7901 Long term (current) use of anticoagulants: Secondary | ICD-10-CM | POA: Diagnosis not present

## 2012-03-30 DIAGNOSIS — I059 Rheumatic mitral valve disease, unspecified: Secondary | ICD-10-CM

## 2012-03-30 DIAGNOSIS — I359 Nonrheumatic aortic valve disorder, unspecified: Secondary | ICD-10-CM | POA: Diagnosis not present

## 2012-03-30 LAB — POCT INR: INR: 2.6

## 2012-04-29 ENCOUNTER — Encounter: Payer: Self-pay | Admitting: Cardiology

## 2012-05-01 DIAGNOSIS — Z7982 Long term (current) use of aspirin: Secondary | ICD-10-CM | POA: Diagnosis not present

## 2012-05-01 DIAGNOSIS — N390 Urinary tract infection, site not specified: Secondary | ICD-10-CM | POA: Diagnosis not present

## 2012-05-01 DIAGNOSIS — Z7901 Long term (current) use of anticoagulants: Secondary | ICD-10-CM | POA: Diagnosis not present

## 2012-05-01 DIAGNOSIS — Z79899 Other long term (current) drug therapy: Secondary | ICD-10-CM | POA: Diagnosis not present

## 2012-05-04 ENCOUNTER — Ambulatory Visit (INDEPENDENT_AMBULATORY_CARE_PROVIDER_SITE_OTHER): Payer: Medicare Other | Admitting: *Deleted

## 2012-05-04 DIAGNOSIS — I059 Rheumatic mitral valve disease, unspecified: Secondary | ICD-10-CM | POA: Diagnosis not present

## 2012-05-04 DIAGNOSIS — Z7901 Long term (current) use of anticoagulants: Secondary | ICD-10-CM

## 2012-05-04 DIAGNOSIS — I359 Nonrheumatic aortic valve disorder, unspecified: Secondary | ICD-10-CM

## 2012-05-04 DIAGNOSIS — I4891 Unspecified atrial fibrillation: Secondary | ICD-10-CM

## 2012-05-10 DIAGNOSIS — L570 Actinic keratosis: Secondary | ICD-10-CM | POA: Diagnosis not present

## 2012-07-03 ENCOUNTER — Ambulatory Visit (INDEPENDENT_AMBULATORY_CARE_PROVIDER_SITE_OTHER): Payer: Medicare Other | Admitting: *Deleted

## 2012-07-03 DIAGNOSIS — I059 Rheumatic mitral valve disease, unspecified: Secondary | ICD-10-CM

## 2012-07-03 DIAGNOSIS — I4891 Unspecified atrial fibrillation: Secondary | ICD-10-CM

## 2012-07-03 DIAGNOSIS — Z7901 Long term (current) use of anticoagulants: Secondary | ICD-10-CM | POA: Diagnosis not present

## 2012-07-03 DIAGNOSIS — I359 Nonrheumatic aortic valve disorder, unspecified: Secondary | ICD-10-CM

## 2012-07-27 ENCOUNTER — Ambulatory Visit (INDEPENDENT_AMBULATORY_CARE_PROVIDER_SITE_OTHER): Payer: Medicare Other | Admitting: *Deleted

## 2012-07-27 DIAGNOSIS — I4891 Unspecified atrial fibrillation: Secondary | ICD-10-CM

## 2012-07-27 DIAGNOSIS — I059 Rheumatic mitral valve disease, unspecified: Secondary | ICD-10-CM

## 2012-07-27 DIAGNOSIS — Z7901 Long term (current) use of anticoagulants: Secondary | ICD-10-CM

## 2012-07-27 DIAGNOSIS — I359 Nonrheumatic aortic valve disorder, unspecified: Secondary | ICD-10-CM

## 2012-07-27 LAB — POCT INR: INR: 2.3

## 2012-08-24 ENCOUNTER — Ambulatory Visit (INDEPENDENT_AMBULATORY_CARE_PROVIDER_SITE_OTHER): Payer: Medicare Other | Admitting: *Deleted

## 2012-08-24 ENCOUNTER — Other Ambulatory Visit: Payer: Self-pay | Admitting: Cardiology

## 2012-08-24 DIAGNOSIS — I359 Nonrheumatic aortic valve disorder, unspecified: Secondary | ICD-10-CM

## 2012-08-24 DIAGNOSIS — I059 Rheumatic mitral valve disease, unspecified: Secondary | ICD-10-CM

## 2012-08-24 DIAGNOSIS — I4891 Unspecified atrial fibrillation: Secondary | ICD-10-CM | POA: Diagnosis not present

## 2012-08-24 DIAGNOSIS — Z7901 Long term (current) use of anticoagulants: Secondary | ICD-10-CM | POA: Diagnosis not present

## 2012-08-24 LAB — POCT INR: INR: 2.4

## 2012-08-24 MED ORDER — WARFARIN SODIUM 5 MG PO TABS: ORAL_TABLET | ORAL | Status: DC

## 2012-08-27 NOTE — Progress Notes (Signed)
I can no close. Needs INR and return date.

## 2012-09-19 ENCOUNTER — Encounter: Payer: Self-pay | Admitting: Cardiology

## 2012-09-21 ENCOUNTER — Ambulatory Visit: Payer: Medicare Other | Admitting: Cardiology

## 2012-09-28 ENCOUNTER — Ambulatory Visit (INDEPENDENT_AMBULATORY_CARE_PROVIDER_SITE_OTHER): Payer: Medicare Other | Admitting: *Deleted

## 2012-09-28 DIAGNOSIS — I4891 Unspecified atrial fibrillation: Secondary | ICD-10-CM

## 2012-09-28 DIAGNOSIS — Z7901 Long term (current) use of anticoagulants: Secondary | ICD-10-CM

## 2012-09-28 DIAGNOSIS — I359 Nonrheumatic aortic valve disorder, unspecified: Secondary | ICD-10-CM

## 2012-09-28 LAB — POCT INR: INR: 1.4

## 2012-10-12 ENCOUNTER — Ambulatory Visit (INDEPENDENT_AMBULATORY_CARE_PROVIDER_SITE_OTHER): Payer: Medicare Other | Admitting: *Deleted

## 2012-10-12 DIAGNOSIS — Z7901 Long term (current) use of anticoagulants: Secondary | ICD-10-CM

## 2012-10-12 DIAGNOSIS — I359 Nonrheumatic aortic valve disorder, unspecified: Secondary | ICD-10-CM | POA: Diagnosis not present

## 2012-10-12 DIAGNOSIS — I059 Rheumatic mitral valve disease, unspecified: Secondary | ICD-10-CM

## 2012-10-12 DIAGNOSIS — I4891 Unspecified atrial fibrillation: Secondary | ICD-10-CM | POA: Diagnosis not present

## 2012-10-25 ENCOUNTER — Other Ambulatory Visit: Payer: Self-pay | Admitting: Cardiology

## 2012-11-15 ENCOUNTER — Ambulatory Visit: Payer: Medicare Other | Admitting: Cardiology

## 2012-11-23 ENCOUNTER — Ambulatory Visit (INDEPENDENT_AMBULATORY_CARE_PROVIDER_SITE_OTHER): Payer: Medicare Other | Admitting: *Deleted

## 2012-11-23 DIAGNOSIS — I059 Rheumatic mitral valve disease, unspecified: Secondary | ICD-10-CM | POA: Diagnosis not present

## 2012-11-23 DIAGNOSIS — Z7901 Long term (current) use of anticoagulants: Secondary | ICD-10-CM

## 2012-11-23 DIAGNOSIS — I4891 Unspecified atrial fibrillation: Secondary | ICD-10-CM | POA: Diagnosis not present

## 2012-11-23 DIAGNOSIS — I359 Nonrheumatic aortic valve disorder, unspecified: Secondary | ICD-10-CM

## 2012-11-23 MED ORDER — WARFARIN SODIUM 5 MG PO TABS: ORAL_TABLET | ORAL | Status: DC

## 2012-11-30 ENCOUNTER — Other Ambulatory Visit: Payer: Self-pay | Admitting: Cardiology

## 2012-12-14 ENCOUNTER — Ambulatory Visit (INDEPENDENT_AMBULATORY_CARE_PROVIDER_SITE_OTHER): Payer: Medicare Other | Admitting: *Deleted

## 2012-12-14 DIAGNOSIS — Z7901 Long term (current) use of anticoagulants: Secondary | ICD-10-CM

## 2012-12-14 DIAGNOSIS — I059 Rheumatic mitral valve disease, unspecified: Secondary | ICD-10-CM

## 2012-12-21 ENCOUNTER — Ambulatory Visit (INDEPENDENT_AMBULATORY_CARE_PROVIDER_SITE_OTHER): Payer: Medicare Other | Admitting: *Deleted

## 2012-12-21 DIAGNOSIS — I359 Nonrheumatic aortic valve disorder, unspecified: Secondary | ICD-10-CM | POA: Diagnosis not present

## 2012-12-21 DIAGNOSIS — I4891 Unspecified atrial fibrillation: Secondary | ICD-10-CM | POA: Diagnosis not present

## 2012-12-21 DIAGNOSIS — Z7901 Long term (current) use of anticoagulants: Secondary | ICD-10-CM | POA: Diagnosis not present

## 2012-12-21 DIAGNOSIS — I059 Rheumatic mitral valve disease, unspecified: Secondary | ICD-10-CM | POA: Diagnosis not present

## 2012-12-21 LAB — POCT INR: INR: 1.3

## 2012-12-28 ENCOUNTER — Ambulatory Visit (INDEPENDENT_AMBULATORY_CARE_PROVIDER_SITE_OTHER): Payer: Medicare Other | Admitting: *Deleted

## 2012-12-28 DIAGNOSIS — I059 Rheumatic mitral valve disease, unspecified: Secondary | ICD-10-CM | POA: Diagnosis not present

## 2012-12-28 DIAGNOSIS — I359 Nonrheumatic aortic valve disorder, unspecified: Secondary | ICD-10-CM | POA: Diagnosis not present

## 2012-12-28 DIAGNOSIS — Z7901 Long term (current) use of anticoagulants: Secondary | ICD-10-CM

## 2012-12-28 DIAGNOSIS — I4891 Unspecified atrial fibrillation: Secondary | ICD-10-CM | POA: Diagnosis not present

## 2013-01-17 ENCOUNTER — Encounter: Payer: Medicare Other | Admitting: Cardiology

## 2013-01-17 ENCOUNTER — Encounter: Payer: Self-pay | Admitting: Cardiology

## 2013-01-17 NOTE — Progress Notes (Signed)
No show  This encounter was created in error - please disregard.

## 2013-01-22 ENCOUNTER — Ambulatory Visit (INDEPENDENT_AMBULATORY_CARE_PROVIDER_SITE_OTHER): Payer: Medicare Other | Admitting: *Deleted

## 2013-01-22 DIAGNOSIS — I4891 Unspecified atrial fibrillation: Secondary | ICD-10-CM | POA: Diagnosis not present

## 2013-01-22 DIAGNOSIS — Z7901 Long term (current) use of anticoagulants: Secondary | ICD-10-CM

## 2013-01-22 DIAGNOSIS — I359 Nonrheumatic aortic valve disorder, unspecified: Secondary | ICD-10-CM

## 2013-01-22 DIAGNOSIS — I059 Rheumatic mitral valve disease, unspecified: Secondary | ICD-10-CM | POA: Diagnosis not present

## 2013-01-24 ENCOUNTER — Other Ambulatory Visit: Payer: Self-pay | Admitting: Cardiology

## 2013-01-24 MED ORDER — ATENOLOL 25 MG PO TABS: 25.0000 mg | ORAL_TABLET | Freq: Every day | ORAL | Status: DC

## 2013-02-19 ENCOUNTER — Ambulatory Visit (INDEPENDENT_AMBULATORY_CARE_PROVIDER_SITE_OTHER): Payer: Medicare Other | Admitting: *Deleted

## 2013-02-19 DIAGNOSIS — I059 Rheumatic mitral valve disease, unspecified: Secondary | ICD-10-CM

## 2013-02-19 DIAGNOSIS — Z7901 Long term (current) use of anticoagulants: Secondary | ICD-10-CM

## 2013-02-19 DIAGNOSIS — I4891 Unspecified atrial fibrillation: Secondary | ICD-10-CM

## 2013-02-19 DIAGNOSIS — I359 Nonrheumatic aortic valve disorder, unspecified: Secondary | ICD-10-CM

## 2013-02-28 ENCOUNTER — Other Ambulatory Visit: Payer: Self-pay | Admitting: Cardiology

## 2013-02-28 MED ORDER — ATENOLOL 25 MG PO TABS: 25.0000 mg | ORAL_TABLET | Freq: Every day | ORAL | Status: DC

## 2013-03-21 ENCOUNTER — Other Ambulatory Visit: Payer: Self-pay | Admitting: Cardiology

## 2013-03-21 MED ORDER — ATENOLOL 25 MG PO TABS: 25.0000 mg | ORAL_TABLET | Freq: Every day | ORAL | Status: DC

## 2013-04-11 ENCOUNTER — Encounter: Payer: Self-pay | Admitting: Cardiology

## 2013-04-11 ENCOUNTER — Encounter (INDEPENDENT_AMBULATORY_CARE_PROVIDER_SITE_OTHER): Payer: Medicare Other

## 2013-04-11 ENCOUNTER — Ambulatory Visit (INDEPENDENT_AMBULATORY_CARE_PROVIDER_SITE_OTHER): Payer: Medicare Other | Admitting: Cardiology

## 2013-04-11 VITALS — BP 175/95 | HR 73 | Ht 68.0 in | Wt 203.8 lb

## 2013-04-11 DIAGNOSIS — Z7901 Long term (current) use of anticoagulants: Secondary | ICD-10-CM | POA: Diagnosis not present

## 2013-04-11 DIAGNOSIS — I4891 Unspecified atrial fibrillation: Secondary | ICD-10-CM

## 2013-04-11 DIAGNOSIS — I359 Nonrheumatic aortic valve disorder, unspecified: Secondary | ICD-10-CM

## 2013-04-11 DIAGNOSIS — I2510 Atherosclerotic heart disease of native coronary artery without angina pectoris: Secondary | ICD-10-CM

## 2013-04-11 DIAGNOSIS — I059 Rheumatic mitral valve disease, unspecified: Secondary | ICD-10-CM

## 2013-04-11 MED ORDER — ATENOLOL 25 MG PO TABS: 25.0000 mg | ORAL_TABLET | Freq: Every day | ORAL | Status: DC

## 2013-04-11 NOTE — Progress Notes (Signed)
   Clinical Summary Phillip Nolan is an 79 y.o.male last seen in February 2013. Has history of missing scheduled visits. He presents today stating that he feels "great." Still lives at home with his wife. He reports no angina symptoms, no palpitations, no bleeding problems or falls.  Echocardiogram from March 2013 showed LVEF 60-65%, diastolic dysfunction with increased filling pressures, bioprosthetic aortic valve with normal motion, mean gradient 20 mmHg, no obvious paravalvular leak, stable mitral annular ring prosthesis, mild left atrial enlargement.  ECG today shows atrial flutter with right bundle branch block and left anterior fascicular block. Heart rate is controlled.   Allergies  Allergen Reactions  . Penicillins Rash    Current Outpatient Prescriptions  Medication Sig Dispense Refill  . aspirin 81 MG EC tablet Take 81 mg by mouth daily.        Marland Kitchen atenolol (TENORMIN) 25 MG tablet Take 1 tablet (25 mg total) by mouth daily.  90 tablet  3  . lisinopril (PRINIVIL,ZESTRIL) 5 MG tablet TAKE ONE TABLET BY MOUTH EVERY DAY  30 tablet  10  . warfarin (COUMADIN) 5 MG tablet Take coumadin 1 tablet daily except 1 1/2 tablets on Tuesdays MANAGED BY LISA       No current facility-administered medications for this visit.    Past Medical History  Diagnosis Date  . 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 Aortic insufficiency     Moderate  . Aortic stenosis     Moderate  . Essential hypertension, benign   . Mitral insufficiency     Severe  . Atrial fibrillation   . BPH (benign prostatic hyperplasia)   . Coronary atherosclerosis of native coronary artery     Nonobstructive, LVEF 35% at cath and 55% ar peri-op TEE    Social History Phillip Nolan reports that he quit smoking about 34 years ago. His smoking use included Pipe and Cigars. He has never used smokeless tobacco. Phillip Nolan reports that he does not  drink alcohol.  Review of Systems Hard of hearing. Stable appetite. Otherwise negative.  Physical Examination Filed Vitals:   04/11/13 1146  BP: 175/95  Pulse: 73   Filed Weights   04/11/13 1146  Weight: 203 lb 12.8 oz (92.443 kg)    Obese ederly male in no acute distress.  HEENT: Conjunctiva and lids normal, oral pharynx status post multiple extractions, moist mucosa.  Neck: Supple, no JVD or bruits.  Lungs: Clear to auscultation, nonlabored.  Cardiac: Irregularly irregularr rate and rhythm with split S2, soft systolic murmur at the base, no S3.  Abdomen: Soft, nontender, bowel sounds present.  Thorax: Well-healed sternal incision.  Extremities: Venous stasis distally, distal pulses 1+, no pitting edema.    Problem List and Plan   Aortic valve disorders Stable bioprosthetic aortic valve, mean gradient 20 mm mercury. Continue current management.  MITRAL VALVE DISORDER Stable mitral annular ring, no significant degree of mitral regurgitation, mild left atrial enlargement.  Atrial flutter ECG reviewed and stable. He continues on Coumadin.  CORONARY ATHEROSCLEROSIS NATIVE CORONARY ARTERY No active angina symptoms. Continue observation with history of nonobstructive disease.    Jonelle Sidle, M.D., F.A.C.C.

## 2013-04-11 NOTE — Assessment & Plan Note (Signed)
ECG reviewed and stable. He continues on Coumadin.

## 2013-04-11 NOTE — Assessment & Plan Note (Signed)
Stable bioprosthetic aortic valve, mean gradient 20 mm mercury. Continue current management.

## 2013-04-11 NOTE — Patient Instructions (Signed)

## 2013-04-11 NOTE — Assessment & Plan Note (Signed)
No active angina symptoms. Continue observation with history of nonobstructive disease.

## 2013-04-11 NOTE — Assessment & Plan Note (Signed)
Stable mitral annular ring, no significant degree of mitral regurgitation, mild left atrial enlargement.

## 2013-04-25 ENCOUNTER — Telehealth: Payer: Self-pay | Admitting: Cardiology

## 2013-04-25 NOTE — Telephone Encounter (Signed)
Pt  had one episode of voiding this am when urine was bright red.  Denies pain.  Told Kendal Hymen that pt should hold coumadin tonight and if he has more frequent episodes tonight to take pt to ED.  If not, appt made for pt to come in for INR check tomorrow am.  Daughter in law verbalized understanding and agreement.

## 2013-04-25 NOTE — Telephone Encounter (Signed)
Started urinating blood this morning. States that its entirely red.

## 2013-04-26 ENCOUNTER — Ambulatory Visit (INDEPENDENT_AMBULATORY_CARE_PROVIDER_SITE_OTHER): Payer: Medicare Other | Admitting: *Deleted

## 2013-04-26 DIAGNOSIS — I059 Rheumatic mitral valve disease, unspecified: Secondary | ICD-10-CM

## 2013-04-26 DIAGNOSIS — I359 Nonrheumatic aortic valve disorder, unspecified: Secondary | ICD-10-CM | POA: Diagnosis not present

## 2013-04-26 DIAGNOSIS — Z7901 Long term (current) use of anticoagulants: Secondary | ICD-10-CM | POA: Diagnosis not present

## 2013-04-26 DIAGNOSIS — I4891 Unspecified atrial fibrillation: Secondary | ICD-10-CM | POA: Diagnosis not present

## 2013-05-10 ENCOUNTER — Ambulatory Visit (INDEPENDENT_AMBULATORY_CARE_PROVIDER_SITE_OTHER): Payer: Medicare Other | Admitting: *Deleted

## 2013-05-10 DIAGNOSIS — I359 Nonrheumatic aortic valve disorder, unspecified: Secondary | ICD-10-CM | POA: Diagnosis not present

## 2013-05-10 DIAGNOSIS — I4891 Unspecified atrial fibrillation: Secondary | ICD-10-CM | POA: Diagnosis not present

## 2013-05-10 DIAGNOSIS — Z7901 Long term (current) use of anticoagulants: Secondary | ICD-10-CM

## 2013-05-10 DIAGNOSIS — I059 Rheumatic mitral valve disease, unspecified: Secondary | ICD-10-CM | POA: Diagnosis not present

## 2013-07-10 ENCOUNTER — Other Ambulatory Visit: Payer: Self-pay | Admitting: *Deleted

## 2013-07-10 MED ORDER — WARFARIN SODIUM 5 MG PO TABS: ORAL_TABLET | ORAL | Status: DC

## 2013-08-06 ENCOUNTER — Other Ambulatory Visit: Payer: Self-pay | Admitting: *Deleted

## 2013-08-06 MED ORDER — WARFARIN SODIUM 5 MG PO TABS: ORAL_TABLET | ORAL | Status: DC

## 2013-08-27 ENCOUNTER — Ambulatory Visit (INDEPENDENT_AMBULATORY_CARE_PROVIDER_SITE_OTHER): Payer: Medicare Other | Admitting: *Deleted

## 2013-08-27 DIAGNOSIS — Z5181 Encounter for therapeutic drug level monitoring: Secondary | ICD-10-CM

## 2013-08-27 DIAGNOSIS — Z7901 Long term (current) use of anticoagulants: Secondary | ICD-10-CM

## 2013-08-27 DIAGNOSIS — I4891 Unspecified atrial fibrillation: Secondary | ICD-10-CM

## 2013-08-27 DIAGNOSIS — I059 Rheumatic mitral valve disease, unspecified: Secondary | ICD-10-CM

## 2013-08-27 DIAGNOSIS — I359 Nonrheumatic aortic valve disorder, unspecified: Secondary | ICD-10-CM | POA: Diagnosis not present

## 2013-08-27 LAB — POCT INR: INR: 1.8

## 2013-08-30 ENCOUNTER — Other Ambulatory Visit: Payer: Self-pay | Admitting: Cardiology

## 2013-08-30 ENCOUNTER — Other Ambulatory Visit: Payer: Self-pay | Admitting: *Deleted

## 2013-08-30 MED ORDER — WARFARIN SODIUM 5 MG PO TABS: 5.0000 mg | ORAL_TABLET | ORAL | Status: DC

## 2013-09-17 ENCOUNTER — Ambulatory Visit (INDEPENDENT_AMBULATORY_CARE_PROVIDER_SITE_OTHER): Payer: Medicare Other | Admitting: *Deleted

## 2013-09-17 DIAGNOSIS — Z7901 Long term (current) use of anticoagulants: Secondary | ICD-10-CM

## 2013-09-17 DIAGNOSIS — Z5181 Encounter for therapeutic drug level monitoring: Secondary | ICD-10-CM

## 2013-09-17 DIAGNOSIS — I059 Rheumatic mitral valve disease, unspecified: Secondary | ICD-10-CM | POA: Diagnosis not present

## 2013-09-17 DIAGNOSIS — I4891 Unspecified atrial fibrillation: Secondary | ICD-10-CM

## 2013-09-17 DIAGNOSIS — I359 Nonrheumatic aortic valve disorder, unspecified: Secondary | ICD-10-CM

## 2013-09-17 LAB — POCT INR: INR: 1.7

## 2013-09-26 ENCOUNTER — Ambulatory Visit (INDEPENDENT_AMBULATORY_CARE_PROVIDER_SITE_OTHER): Payer: Medicare Other | Admitting: Cardiology

## 2013-09-26 ENCOUNTER — Encounter: Payer: Self-pay | Admitting: Cardiology

## 2013-09-26 VITALS — BP 159/90 | HR 66 | Ht 68.0 in | Wt 194.4 lb

## 2013-09-26 DIAGNOSIS — I059 Rheumatic mitral valve disease, unspecified: Secondary | ICD-10-CM

## 2013-09-26 DIAGNOSIS — I359 Nonrheumatic aortic valve disorder, unspecified: Secondary | ICD-10-CM | POA: Diagnosis not present

## 2013-09-26 DIAGNOSIS — I4892 Unspecified atrial flutter: Secondary | ICD-10-CM

## 2013-09-26 DIAGNOSIS — I2510 Atherosclerotic heart disease of native coronary artery without angina pectoris: Secondary | ICD-10-CM | POA: Diagnosis not present

## 2013-09-26 NOTE — Assessment & Plan Note (Signed)
He continues on Coumadin. 

## 2013-09-26 NOTE — Assessment & Plan Note (Signed)
Bioprosthetic aortic valve, mean gradient 20 mm mercury. No change in examination. Continue current management.

## 2013-09-26 NOTE — Assessment & Plan Note (Addendum)
Mitral annular ring, no significant degree of mitral regurgitation, mild left atrial enlargement.

## 2013-09-26 NOTE — Patient Instructions (Signed)

## 2013-09-26 NOTE — Progress Notes (Signed)
    Clinical Summary Phillip Nolan is an 80 y.o.male last seen in September 2014. He reports no change in overall status. Continues to take his medications, including Coumadin, and has followup in the Coumadin clinic. He tells that he is trying to memorize the entire Bible.  No progressive exertional shortness of breath or angina symptoms. Denies any significant palpitations. He has had no major bleeding problems on Coumadin.  Echocardiogram from March 2013 showed LVEF 60-65%, diastolic dysfunction with increased filling pressures, bioprosthetic aortic valve with normal motion, mean gradient 20 mmHg, no obvious paravalvular leak, stable mitral annular ring prosthesis, mild left atrial enlargement.   Allergies  Allergen Reactions  . Penicillins Rash    Current Outpatient Prescriptions  Medication Sig Dispense Refill  . aspirin 81 MG EC tablet Take 81 mg by mouth daily.        Marland Kitchen. atenolol (TENORMIN) 25 MG tablet TAKE 1 TABLET ONCE DAILY.  30 tablet  6  . lisinopril (PRINIVIL,ZESTRIL) 5 MG tablet TAKE 1 TABLET ONCE DAILY.  30 tablet  6  . warfarin (COUMADIN) 5 MG tablet Take 5 mg by mouth as directed. Managed by Phillip Nolan.       No current facility-administered medications for this visit.    Past Medical History  Diagnosis Date  . 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 Aortic insufficiency     Moderate  . Aortic stenosis     Moderate  . Essential hypertension, benign   . Mitral insufficiency     Severe  . Atrial fibrillation   . BPH (benign prostatic hyperplasia)   . Coronary atherosclerosis of native coronary artery     Nonobstructive, LVEF 35% at cath and 55% ar peri-op TEE    Past Surgical History  Procedure Laterality Date  . Median sterntomy  4/11    Dr. Cornelius Moraswen - AVR (27 mm Edwards pericardial) and MV repair  . Multiple tooth extractions  4/11    (pre-op above)    Social History Phillip Nolan  reports that he quit smoking about 35 years ago. His smoking use included Pipe and Cigars. He has never used smokeless tobacco. Phillip Nolan reports that he does not drink alcohol.  Review of Systems No fevers or chills. Stable appetite. No orthopnea or PND. Otherwise negative.  Physical Examination Filed Vitals:   09/26/13 1132  BP: 159/90  Pulse: 66   Filed Weights   09/26/13 1132  Weight: 194 lb 6.4 oz (88.179 kg)    Obese ederly male in no acute distress.  HEENT: Conjunctiva and lids normal, oral pharynx status post multiple extractions, moist mucosa.  Neck: Supple, no JVD or bruits.  Lungs: Clear to auscultation, nonlabored.  Cardiac: Irregularly irregularr rate and rhythm with split S2, soft systolic murmur at the base, no S3.  Abdomen: Soft, nontender, bowel sounds present.  Thorax: Well-healed sternal incision.  Extremities: Venous stasis distally, distal pulses 1+, no pitting edema.    Problem List and Plan   CORONARY ATHEROSCLEROSIS NATIVE CORONARY ARTERY History of nonobstructive disease, no active angina symptoms.  Aortic valve disorders Bioprosthetic aortic valve, mean gradient 20 mm mercury. No change in examination. Continue current management.  MITRAL VALVE DISORDER Mitral annular ring, no significant degree of mitral regurgitation, mild left atrial enlargement.  Atrial flutter He continues on Coumadin.    Phillip Nolan, M.D., F.A.C.C.

## 2013-09-26 NOTE — Assessment & Plan Note (Signed)
History of nonobstructive disease, no active angina symptoms. 

## 2013-10-15 ENCOUNTER — Ambulatory Visit (INDEPENDENT_AMBULATORY_CARE_PROVIDER_SITE_OTHER): Payer: Medicare Other | Admitting: *Deleted

## 2013-10-15 DIAGNOSIS — Z7901 Long term (current) use of anticoagulants: Secondary | ICD-10-CM | POA: Diagnosis not present

## 2013-10-15 DIAGNOSIS — I359 Nonrheumatic aortic valve disorder, unspecified: Secondary | ICD-10-CM

## 2013-10-15 DIAGNOSIS — Z5181 Encounter for therapeutic drug level monitoring: Secondary | ICD-10-CM

## 2013-10-15 DIAGNOSIS — I4891 Unspecified atrial fibrillation: Secondary | ICD-10-CM | POA: Diagnosis not present

## 2013-10-15 DIAGNOSIS — I059 Rheumatic mitral valve disease, unspecified: Secondary | ICD-10-CM

## 2013-10-15 LAB — POCT INR: INR: 3.4

## 2013-11-19 ENCOUNTER — Encounter: Payer: Self-pay | Admitting: *Deleted

## 2013-11-29 ENCOUNTER — Ambulatory Visit (INDEPENDENT_AMBULATORY_CARE_PROVIDER_SITE_OTHER): Payer: Medicare Other | Admitting: *Deleted

## 2013-11-29 DIAGNOSIS — I059 Rheumatic mitral valve disease, unspecified: Secondary | ICD-10-CM

## 2013-11-29 DIAGNOSIS — Z5181 Encounter for therapeutic drug level monitoring: Secondary | ICD-10-CM

## 2013-11-29 LAB — POCT INR: INR: 2.2

## 2013-12-24 ENCOUNTER — Other Ambulatory Visit: Payer: Self-pay | Admitting: Cardiology

## 2013-12-27 ENCOUNTER — Ambulatory Visit (INDEPENDENT_AMBULATORY_CARE_PROVIDER_SITE_OTHER): Payer: Medicare Other | Admitting: *Deleted

## 2013-12-27 DIAGNOSIS — Z5181 Encounter for therapeutic drug level monitoring: Secondary | ICD-10-CM

## 2013-12-27 DIAGNOSIS — I059 Rheumatic mitral valve disease, unspecified: Secondary | ICD-10-CM | POA: Diagnosis not present

## 2013-12-27 LAB — POCT INR: INR: 1.9

## 2014-01-31 ENCOUNTER — Ambulatory Visit (INDEPENDENT_AMBULATORY_CARE_PROVIDER_SITE_OTHER): Payer: Medicare Other | Admitting: *Deleted

## 2014-01-31 DIAGNOSIS — Z5181 Encounter for therapeutic drug level monitoring: Secondary | ICD-10-CM | POA: Diagnosis not present

## 2014-01-31 DIAGNOSIS — I059 Rheumatic mitral valve disease, unspecified: Secondary | ICD-10-CM | POA: Diagnosis not present

## 2014-01-31 LAB — POCT INR: INR: 1.7

## 2014-02-21 ENCOUNTER — Ambulatory Visit (INDEPENDENT_AMBULATORY_CARE_PROVIDER_SITE_OTHER): Payer: Medicare Other | Admitting: *Deleted

## 2014-02-21 DIAGNOSIS — I059 Rheumatic mitral valve disease, unspecified: Secondary | ICD-10-CM | POA: Diagnosis not present

## 2014-02-21 DIAGNOSIS — Z5181 Encounter for therapeutic drug level monitoring: Secondary | ICD-10-CM | POA: Diagnosis not present

## 2014-02-21 LAB — POCT INR: INR: 1.7

## 2014-02-25 ENCOUNTER — Other Ambulatory Visit: Payer: Self-pay | Admitting: Cardiology

## 2014-03-11 ENCOUNTER — Ambulatory Visit (INDEPENDENT_AMBULATORY_CARE_PROVIDER_SITE_OTHER): Payer: Medicare Other | Admitting: *Deleted

## 2014-03-11 DIAGNOSIS — Z5181 Encounter for therapeutic drug level monitoring: Secondary | ICD-10-CM | POA: Diagnosis not present

## 2014-03-11 DIAGNOSIS — I059 Rheumatic mitral valve disease, unspecified: Secondary | ICD-10-CM

## 2014-03-11 LAB — POCT INR: INR: 3.3

## 2014-03-26 ENCOUNTER — Other Ambulatory Visit: Payer: Self-pay | Admitting: *Deleted

## 2014-03-26 MED ORDER — ATENOLOL 25 MG PO TABS: 25.0000 mg | ORAL_TABLET | Freq: Every day | ORAL | Status: DC

## 2014-03-26 MED ORDER — WARFARIN SODIUM 5 MG PO TABS: 5.0000 mg | ORAL_TABLET | ORAL | Status: DC

## 2014-04-02 ENCOUNTER — Encounter: Payer: Self-pay | Admitting: Cardiology

## 2014-04-02 ENCOUNTER — Encounter (INDEPENDENT_AMBULATORY_CARE_PROVIDER_SITE_OTHER): Payer: Medicare Other

## 2014-04-02 ENCOUNTER — Ambulatory Visit (INDEPENDENT_AMBULATORY_CARE_PROVIDER_SITE_OTHER): Payer: Medicare Other | Admitting: *Deleted

## 2014-04-02 ENCOUNTER — Ambulatory Visit (INDEPENDENT_AMBULATORY_CARE_PROVIDER_SITE_OTHER): Payer: Medicare Other | Admitting: Cardiology

## 2014-04-02 VITALS — BP 138/72 | HR 59 | Ht 68.0 in | Wt 197.0 lb

## 2014-04-02 DIAGNOSIS — I2510 Atherosclerotic heart disease of native coronary artery without angina pectoris: Secondary | ICD-10-CM | POA: Diagnosis not present

## 2014-04-02 DIAGNOSIS — I359 Nonrheumatic aortic valve disorder, unspecified: Secondary | ICD-10-CM | POA: Diagnosis not present

## 2014-04-02 DIAGNOSIS — I482 Chronic atrial fibrillation, unspecified: Secondary | ICD-10-CM

## 2014-04-02 DIAGNOSIS — I10 Essential (primary) hypertension: Secondary | ICD-10-CM | POA: Diagnosis not present

## 2014-04-02 DIAGNOSIS — I059 Rheumatic mitral valve disease, unspecified: Secondary | ICD-10-CM

## 2014-04-02 DIAGNOSIS — Z5181 Encounter for therapeutic drug level monitoring: Secondary | ICD-10-CM

## 2014-04-02 DIAGNOSIS — I4891 Unspecified atrial fibrillation: Secondary | ICD-10-CM

## 2014-04-02 LAB — POCT INR: INR: 3.5

## 2014-04-02 NOTE — Assessment & Plan Note (Signed)
No active angina symptoms, history of nonobstructive disease.

## 2014-04-02 NOTE — Assessment & Plan Note (Signed)
Mitral annular ring, no significant degree of mitral regurgitation, mild left atrial enlargement.

## 2014-04-02 NOTE — Progress Notes (Signed)
Clinical Summary Phillip Nolan is an 80 y.o.male last seen in March. He states that he has been doing well. No sense of palpitations or increasing shortness of breath. He reports compliance with medications, and no bleeding problems with Coumadin. Most recent INR was 3.3.  Echocardiogram from March 2013 showed LVEF 60-65%, diastolic dysfunction with increased filling pressures, bioprosthetic aortic valve with normal motion, mean gradient 20 mmHg, no obvious paravalvular leak, stable mitral annular ring prosthesis, mild left atrial enlargement.  Today's ECG shows rate-controlled atrial fibrillation with right bundle branch block and left anterior fascicular block.   Allergies  Allergen Reactions  . Penicillins Rash    Current Outpatient Prescriptions  Medication Sig Dispense Refill  . aspirin 81 MG EC tablet Take 81 mg by mouth daily.        Marland Kitchen atenolol (TENORMIN) 25 MG tablet Take 1 tablet (25 mg total) by mouth daily.  30 tablet  6  . lisinopril (PRINIVIL,ZESTRIL) 5 MG tablet TAKE 1 TABLET ONCE DAILY.  30 tablet  3  . warfarin (COUMADIN) 5 MG tablet Take 1 tablet (5 mg total) by mouth as directed. Managed by Phillip Nolan.  45 tablet  3   No current facility-administered medications for this visit.    Past Medical History  Diagnosis Date  . 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 Aortic insufficiency     Moderate  . Aortic stenosis     Moderate  . Essential hypertension, benign   . Mitral insufficiency     Severe  . Atrial fibrillation   . BPH (benign prostatic hyperplasia)   . Coronary atherosclerosis of native coronary artery     Nonobstructive, LVEF 35% at cath and 55% ar peri-op TEE    Past Surgical History  Procedure Laterality Date  . Median sterntomy  4/11    Dr. Cornelius Moras - AVR (27 mm Edwards pericardial) and MV repair  . Multiple tooth extractions  4/11    (pre-op above)    Social  History Phillip Nolan reports that he quit smoking about 35 years ago. His smoking use included Pipe and Cigars. He has never used smokeless tobacco. Phillip Nolan reports that he does not drink alcohol.  Review of Systems Hard of hearing. No claudication. No falls. Other systems reviewed and negative except as outlined.  Physical Examination Filed Vitals:   04/02/14 1139  BP: 138/72  Pulse: 59   Filed Weights   04/02/14 1139  Weight: 197 lb (89.359 kg)    Obese ederly male in no acute distress.  HEENT: Conjunctiva and lids normal, oral pharynx status post multiple extractions, moist mucosa.  Neck: Supple, no JVD or bruits.  Lungs: Clear to auscultation, nonlabored.  Cardiac: Irregularly irregularr rate and rhythm with split S2, soft systolic murmur at the base, no S3.  Abdomen: Soft, nontender, bowel sounds present.  Thorax: Well-healed sternal incision.  Extremities: Venous stasis distally, distal pulses 1+, no pitting edema.    Problem List and Plan   Chronic atrial fibrillation No change to current regimen, ECG reviewed. Keep followup in the Coumadin clinic.  CORONARY ATHEROSCLEROSIS NATIVE CORONARY ARTERY No active angina symptoms, history of nonobstructive disease.  Aortic valve disorders Bioprosthetic aortic valve, mean gradient 20 mm mercury. No change in examination. Continue current management.  MITRAL VALVE DISORDER Mitral annular ring, no significant degree of mitral regurgitation, mild left atrial enlargement.    Jonelle Sidle, M.D., F.A.C.C.

## 2014-04-02 NOTE — Assessment & Plan Note (Signed)
Bioprosthetic aortic valve, mean gradient 20 mm mercury. No change in examination. Continue current management.

## 2014-04-02 NOTE — Assessment & Plan Note (Signed)
No change to current regimen, ECG reviewed. Keep followup in the Coumadin clinic.

## 2014-04-02 NOTE — Patient Instructions (Signed)

## 2014-05-22 ENCOUNTER — Ambulatory Visit (INDEPENDENT_AMBULATORY_CARE_PROVIDER_SITE_OTHER): Payer: Medicare Other | Admitting: *Deleted

## 2014-05-22 DIAGNOSIS — I059 Rheumatic mitral valve disease, unspecified: Secondary | ICD-10-CM | POA: Diagnosis not present

## 2014-05-22 DIAGNOSIS — Z5181 Encounter for therapeutic drug level monitoring: Secondary | ICD-10-CM | POA: Diagnosis not present

## 2014-05-22 LAB — POCT INR: INR: 2.4

## 2014-06-19 ENCOUNTER — Ambulatory Visit (INDEPENDENT_AMBULATORY_CARE_PROVIDER_SITE_OTHER): Payer: Medicare Other | Admitting: *Deleted

## 2014-06-19 DIAGNOSIS — Z5181 Encounter for therapeutic drug level monitoring: Secondary | ICD-10-CM | POA: Diagnosis not present

## 2014-06-19 DIAGNOSIS — I059 Rheumatic mitral valve disease, unspecified: Secondary | ICD-10-CM | POA: Diagnosis not present

## 2014-06-19 LAB — POCT INR: INR: 2.1

## 2014-07-22 ENCOUNTER — Ambulatory Visit (INDEPENDENT_AMBULATORY_CARE_PROVIDER_SITE_OTHER): Payer: Medicare Other | Admitting: *Deleted

## 2014-07-22 ENCOUNTER — Other Ambulatory Visit: Payer: Self-pay | Admitting: *Deleted

## 2014-07-22 DIAGNOSIS — I059 Rheumatic mitral valve disease, unspecified: Secondary | ICD-10-CM

## 2014-07-22 DIAGNOSIS — Z5181 Encounter for therapeutic drug level monitoring: Secondary | ICD-10-CM | POA: Diagnosis not present

## 2014-07-22 LAB — POCT INR: INR: 1.7

## 2014-07-22 MED ORDER — LISINOPRIL 5 MG PO TABS: 5.0000 mg | ORAL_TABLET | Freq: Every day | ORAL | Status: DC

## 2014-10-03 ENCOUNTER — Telehealth: Payer: Self-pay | Admitting: *Deleted

## 2014-10-03 ENCOUNTER — Ambulatory Visit (INDEPENDENT_AMBULATORY_CARE_PROVIDER_SITE_OTHER): Payer: Medicare Other | Admitting: Pharmacist

## 2014-10-03 DIAGNOSIS — Z880 Allergy status to penicillin: Secondary | ICD-10-CM | POA: Diagnosis not present

## 2014-10-03 DIAGNOSIS — N1330 Unspecified hydronephrosis: Secondary | ICD-10-CM | POA: Diagnosis not present

## 2014-10-03 DIAGNOSIS — N329 Bladder disorder, unspecified: Secondary | ICD-10-CM | POA: Diagnosis not present

## 2014-10-03 DIAGNOSIS — I10 Essential (primary) hypertension: Secondary | ICD-10-CM | POA: Diagnosis not present

## 2014-10-03 DIAGNOSIS — I059 Rheumatic mitral valve disease, unspecified: Secondary | ICD-10-CM

## 2014-10-03 DIAGNOSIS — N3289 Other specified disorders of bladder: Secondary | ICD-10-CM | POA: Diagnosis not present

## 2014-10-03 DIAGNOSIS — R109 Unspecified abdominal pain: Secondary | ICD-10-CM | POA: Diagnosis not present

## 2014-10-03 DIAGNOSIS — I509 Heart failure, unspecified: Secondary | ICD-10-CM | POA: Diagnosis not present

## 2014-10-03 DIAGNOSIS — R310 Gross hematuria: Secondary | ICD-10-CM | POA: Diagnosis not present

## 2014-10-03 DIAGNOSIS — I482 Chronic atrial fibrillation: Secondary | ICD-10-CM | POA: Diagnosis not present

## 2014-10-03 DIAGNOSIS — K4020 Bilateral inguinal hernia, without obstruction or gangrene, not specified as recurrent: Secondary | ICD-10-CM | POA: Diagnosis not present

## 2014-10-03 DIAGNOSIS — Z5181 Encounter for therapeutic drug level monitoring: Secondary | ICD-10-CM

## 2014-10-03 DIAGNOSIS — N210 Calculus in bladder: Secondary | ICD-10-CM | POA: Diagnosis not present

## 2014-10-03 DIAGNOSIS — R319 Hematuria, unspecified: Secondary | ICD-10-CM | POA: Diagnosis not present

## 2014-10-03 DIAGNOSIS — N3001 Acute cystitis with hematuria: Secondary | ICD-10-CM | POA: Diagnosis not present

## 2014-10-03 LAB — POCT INR: INR: 3.1

## 2014-10-03 NOTE — Telephone Encounter (Signed)
Returned call to pt's daughter in law Kendal HymenBonnie, she states pt is having blood in urine.  Pt missed last f/u appt on 08/13/14 in [facility] [facility] secondary to wife was sick and passed away.  Unsure what dosage of Coumadin pt has been taking will look at pill box, but thinks pt has been taking 1 tablet daily except 1.5 tablets on MWF.  Pt occasionally misses a dosage of Coumadin and will take 1 tablet at noon and another that night. Pt has been told not to double up on dosages if he misses, but pt is very independent per Bonnie's report and manages his own medications and pill box.  She is concerned blood in his urine is because his INR is too high.  [facility] spoke with Isabelle CourseLydia, added pt on for INR check at 12 with results called to Coumadin clinic while pt in office.  Will await results, see anticoagulation note in Epic.

## 2014-10-04 DIAGNOSIS — I509 Heart failure, unspecified: Secondary | ICD-10-CM | POA: Diagnosis not present

## 2014-10-04 DIAGNOSIS — N3289 Other specified disorders of bladder: Secondary | ICD-10-CM | POA: Diagnosis not present

## 2014-10-04 DIAGNOSIS — I482 Chronic atrial fibrillation: Secondary | ICD-10-CM | POA: Diagnosis not present

## 2014-10-04 DIAGNOSIS — R310 Gross hematuria: Secondary | ICD-10-CM | POA: Diagnosis not present

## 2014-10-04 DIAGNOSIS — I10 Essential (primary) hypertension: Secondary | ICD-10-CM | POA: Diagnosis not present

## 2014-10-04 DIAGNOSIS — N1330 Unspecified hydronephrosis: Secondary | ICD-10-CM | POA: Diagnosis not present

## 2014-10-04 DIAGNOSIS — N3001 Acute cystitis with hematuria: Secondary | ICD-10-CM | POA: Diagnosis not present

## 2014-10-04 DIAGNOSIS — Z880 Allergy status to penicillin: Secondary | ICD-10-CM | POA: Diagnosis not present

## 2014-10-04 DIAGNOSIS — N210 Calculus in bladder: Secondary | ICD-10-CM | POA: Diagnosis not present

## 2014-10-06 ENCOUNTER — Encounter: Payer: Self-pay | Admitting: *Deleted

## 2014-10-07 ENCOUNTER — Ambulatory Visit: Payer: Self-pay | Admitting: *Deleted

## 2014-10-07 DIAGNOSIS — Z5181 Encounter for therapeutic drug level monitoring: Secondary | ICD-10-CM

## 2014-10-09 ENCOUNTER — Ambulatory Visit (INDEPENDENT_AMBULATORY_CARE_PROVIDER_SITE_OTHER): Payer: Medicare Other | Admitting: *Deleted

## 2014-10-09 DIAGNOSIS — I482 Chronic atrial fibrillation, unspecified: Secondary | ICD-10-CM

## 2014-10-09 DIAGNOSIS — I059 Rheumatic mitral valve disease, unspecified: Secondary | ICD-10-CM | POA: Diagnosis not present

## 2014-10-09 DIAGNOSIS — Z7982 Long term (current) use of aspirin: Secondary | ICD-10-CM | POA: Diagnosis not present

## 2014-10-09 DIAGNOSIS — Z5181 Encounter for therapeutic drug level monitoring: Secondary | ICD-10-CM

## 2014-10-09 DIAGNOSIS — Z466 Encounter for fitting and adjustment of urinary device: Secondary | ICD-10-CM | POA: Diagnosis not present

## 2014-10-09 DIAGNOSIS — R339 Retention of urine, unspecified: Secondary | ICD-10-CM | POA: Diagnosis not present

## 2014-10-09 DIAGNOSIS — Z7901 Long term (current) use of anticoagulants: Secondary | ICD-10-CM | POA: Diagnosis not present

## 2014-10-09 DIAGNOSIS — Z79899 Other long term (current) drug therapy: Secondary | ICD-10-CM | POA: Diagnosis not present

## 2014-10-09 DIAGNOSIS — Z952 Presence of prosthetic heart valve: Secondary | ICD-10-CM | POA: Diagnosis not present

## 2014-10-09 LAB — POCT INR: INR: 1.2

## 2014-10-15 ENCOUNTER — Telehealth: Payer: Self-pay | Admitting: Cardiology

## 2014-10-15 NOTE — Telephone Encounter (Signed)
Having trouble sleeping since he has been out of hospital and his wife passed

## 2014-10-17 NOTE — Telephone Encounter (Signed)
Daughter in law informed that patient can use benadryl.

## 2014-10-20 DIAGNOSIS — R339 Retention of urine, unspecified: Secondary | ICD-10-CM | POA: Diagnosis not present

## 2014-10-20 DIAGNOSIS — G4700 Insomnia, unspecified: Secondary | ICD-10-CM | POA: Diagnosis not present

## 2014-10-20 DIAGNOSIS — K4090 Unilateral inguinal hernia, without obstruction or gangrene, not specified as recurrent: Secondary | ICD-10-CM | POA: Diagnosis not present

## 2014-10-20 DIAGNOSIS — R3915 Urgency of urination: Secondary | ICD-10-CM | POA: Diagnosis not present

## 2014-10-21 ENCOUNTER — Ambulatory Visit (INDEPENDENT_AMBULATORY_CARE_PROVIDER_SITE_OTHER): Payer: Medicare Other | Admitting: *Deleted

## 2014-10-21 ENCOUNTER — Encounter: Payer: Self-pay | Admitting: Cardiology

## 2014-10-21 ENCOUNTER — Ambulatory Visit (INDEPENDENT_AMBULATORY_CARE_PROVIDER_SITE_OTHER): Payer: Medicare Other | Admitting: Cardiology

## 2014-10-21 VITALS — BP 142/70 | HR 47 | Ht 68.0 in | Wt 207.0 lb

## 2014-10-21 DIAGNOSIS — I359 Nonrheumatic aortic valve disorder, unspecified: Secondary | ICD-10-CM

## 2014-10-21 DIAGNOSIS — Z5181 Encounter for therapeutic drug level monitoring: Secondary | ICD-10-CM

## 2014-10-21 DIAGNOSIS — I059 Rheumatic mitral valve disease, unspecified: Secondary | ICD-10-CM

## 2014-10-21 DIAGNOSIS — I482 Chronic atrial fibrillation, unspecified: Secondary | ICD-10-CM

## 2014-10-21 LAB — POCT INR: INR: 1

## 2014-10-21 MED ORDER — ATENOLOL 25 MG PO TABS: 12.5000 mg | ORAL_TABLET | Freq: Every day | ORAL | Status: DC

## 2014-10-21 NOTE — Patient Instructions (Signed)
Your physician recommends that you schedule a follow-up appointment in: 6 months. You will receive a reminder letter in the mail in about 4 months reminding you to call and schedule your appointment. If you don't receive this letter, please contact our office. Your physician has recommended you make the following change in your medication:  Decrease atenolol to 12.5 mg daily. Please break your 25 mg tablet in half daily. Continue all other medications the same.

## 2014-10-21 NOTE — Progress Notes (Signed)
Cardiology Office Note  Date: 10/21/2014   ID: Phillip Nolan, DOB 1927/09/29, MRN [id number]  PCP: No primary care provider on file.  Primary Cardiologist: Nona DellSamuel McDowell, MD   Chief Complaint  Patient presents with  . Valvular heart disease  . Atrial Fibrillation  . Coronary Artery Disease    History of Present Illness: Phillip SportsRufus Wyley Bamford is an 81 y.o. male last seen in September 2015. He presents for a follow-up visit. He tells me that he has had trouble sleeping, also gets fatigued but no major progression in symptoms. No sense of palpitations and no chest pain reported. His wife of 56 years passed away, he has been trying to pass his time by memorizing the Bible.  He states that he has been taking his medications regularly. INR has been subtherapeutic recently however.  Most recent echocardiogram from 2013 is noted below.   Past Medical History  Diagnosis Date  . History of bacteremia     Streptococcus gordonii bacteremia 2/11 - no clear vegetation by TEE  . Essential hypertension, benign   . Atrial fibrillation   . BPH (benign prostatic hyperplasia)   . Coronary atherosclerosis of native coronary artery     Nonobstructive  . Aortic valve disease     Status post AVR 2011  . Mitral valve disease     Status post annular repair 2011    Past Surgical History  Procedure Laterality Date  . Median sterntomy  4/11    Dr. Cornelius Moraswen - AVR (27 mm Edwards pericardial) and MV repair  . Multiple tooth extractions  4/11    (pre-op above)    Current Outpatient Prescriptions  Medication Sig Dispense Refill  . aspirin 81 MG EC tablet Take 81 mg by mouth daily.      Marland Kitchen. atenolol (TENORMIN) 25 MG tablet Take 0.5 tablets (12.5 mg total) by mouth daily. 15 tablet 6  . lisinopril (PRINIVIL,ZESTRIL) 5 MG tablet Take 1 tablet (5 mg total) by mouth daily. 30 tablet 6  . warfarin (COUMADIN) 5 MG tablet Take 1 tablet (5 mg total) by mouth as directed. Managed by Misty StanleyLisa. (Patient not  taking: Reported on 10/21/2014) 45 tablet 3   No current facility-administered medications for this visit.    Allergies:  Penicillins   Social History: The patient  reports that he quit smoking about 36 years ago. His smoking use included Pipe and Cigars. He has never used smokeless tobacco. He reports that he does not drink alcohol or use illicit drugs.   ROS:  Please see the history of present illness. Otherwise, complete review of systems is positive for decreased hearing.  All other systems are reviewed and negative.   Physical Exam: VS:  BP 142/70 mmHg  Pulse 47  Ht 5\' 8"  (1.727 m)  Wt 207 lb (93.895 kg)  BMI 31.48 kg/m2  SpO2 98%, BMI Body mass index is 31.48 kg/(m^2).  Wt Readings from Last 3 Encounters:  10/21/14 207 lb (93.895 kg)  04/02/14 197 lb (89.359 kg)  09/26/13 194 lb 6.4 oz (88.179 kg)     Obese ederly male, somewhat disheveled, in no acute distress.  HEENT: Conjunctiva and lids normal, oral pharynx status post multiple extractions. Neck: Supple, no JVD or bruits.  Lungs: Clear to auscultation, nonlabored.  Cardiac: Irregularly irregularr rate and rhythm with split S2, soft systolic murmur at the base, no S3.  Abdomen: Soft, nontender, bowel sounds present.  Extremities: Venous stasis distally, distal pulses 1+, no pitting edema.  ECG: ECG is not ordered today.   Other Studies Reviewed Today:  Echocardiogram from March 2013 showed LVEF 60-65%, diastolic dysfunction with increased filling pressures, bioprosthetic aortic valve with normal motion, mean gradient 20 mmHg, no obvious paravalvular leak, stable mitral annular ring prosthesis, mild left atrial enlargement.  Assessment and Plan:  1. Chronic atrial fibrillation. Heart rate is fairly slow today. Not certain that this is necessarily causing his fatigue, but we will reduce his atenolol dose to 12.5 mg daily. Otherwise continue Coumadin and keep follow-up in the anticoagulation clinic.  2. History  of bioprosthetic AVR as well as mitral valve annular repair in 2011. Echocardiogram from 2013 noted above.  Current medicines were reviewed with the patient today. Atenolol dose is being reduced as noted.  Disposition: FU with me in 6 months.   Signed, Phillip Sidle, MD, [facility] 10/21/2014 2:30 PM    [facility] at [facility] [address] [place], [place], [place] [address] Phone: [phone]; Fax: [phone]

## 2014-10-22 DIAGNOSIS — N210 Calculus in bladder: Secondary | ICD-10-CM | POA: Diagnosis not present

## 2014-11-12 DIAGNOSIS — K4090 Unilateral inguinal hernia, without obstruction or gangrene, not specified as recurrent: Secondary | ICD-10-CM | POA: Diagnosis not present

## 2014-11-13 ENCOUNTER — Telehealth: Payer: Self-pay | Admitting: *Deleted

## 2014-11-13 NOTE — Telephone Encounter (Signed)
Pt has been off coumadin pending hernia repair.  Kendal HymenBonnie states Dr Tamala Bariaffey said pt can restart coumadin now.  Surgery is scheduled for 5/17.  Pt will stop coumadin 5/12 and restart 5/17.  Kendal HymenBonnie will call when pt is d/c and make f/u INR appt. in 7-10 days after resuming coumadin

## 2014-12-01 DIAGNOSIS — I4891 Unspecified atrial fibrillation: Secondary | ICD-10-CM | POA: Diagnosis not present

## 2014-12-01 DIAGNOSIS — I10 Essential (primary) hypertension: Secondary | ICD-10-CM | POA: Diagnosis not present

## 2014-12-01 DIAGNOSIS — Z880 Allergy status to penicillin: Secondary | ICD-10-CM | POA: Diagnosis not present

## 2014-12-01 DIAGNOSIS — Z79899 Other long term (current) drug therapy: Secondary | ICD-10-CM | POA: Diagnosis not present

## 2014-12-01 DIAGNOSIS — Z952 Presence of prosthetic heart valve: Secondary | ICD-10-CM | POA: Diagnosis not present

## 2014-12-01 DIAGNOSIS — K4090 Unilateral inguinal hernia, without obstruction or gangrene, not specified as recurrent: Secondary | ICD-10-CM | POA: Diagnosis not present

## 2014-12-01 DIAGNOSIS — Z951 Presence of aortocoronary bypass graft: Secondary | ICD-10-CM | POA: Diagnosis not present

## 2014-12-01 DIAGNOSIS — M1990 Unspecified osteoarthritis, unspecified site: Secondary | ICD-10-CM | POA: Diagnosis not present

## 2014-12-01 DIAGNOSIS — Z87442 Personal history of urinary calculi: Secondary | ICD-10-CM | POA: Diagnosis not present

## 2014-12-01 DIAGNOSIS — G4700 Insomnia, unspecified: Secondary | ICD-10-CM | POA: Diagnosis not present

## 2014-12-02 DIAGNOSIS — G4700 Insomnia, unspecified: Secondary | ICD-10-CM | POA: Diagnosis not present

## 2014-12-02 DIAGNOSIS — Z952 Presence of prosthetic heart valve: Secondary | ICD-10-CM | POA: Diagnosis not present

## 2014-12-02 DIAGNOSIS — Z79899 Other long term (current) drug therapy: Secondary | ICD-10-CM | POA: Diagnosis not present

## 2014-12-02 DIAGNOSIS — I10 Essential (primary) hypertension: Secondary | ICD-10-CM | POA: Diagnosis not present

## 2014-12-02 DIAGNOSIS — I4891 Unspecified atrial fibrillation: Secondary | ICD-10-CM | POA: Diagnosis not present

## 2014-12-02 DIAGNOSIS — Z880 Allergy status to penicillin: Secondary | ICD-10-CM | POA: Diagnosis not present

## 2014-12-02 DIAGNOSIS — Z87442 Personal history of urinary calculi: Secondary | ICD-10-CM | POA: Diagnosis not present

## 2014-12-02 DIAGNOSIS — M1990 Unspecified osteoarthritis, unspecified site: Secondary | ICD-10-CM | POA: Diagnosis not present

## 2014-12-02 DIAGNOSIS — Z951 Presence of aortocoronary bypass graft: Secondary | ICD-10-CM | POA: Diagnosis not present

## 2014-12-02 DIAGNOSIS — K4090 Unilateral inguinal hernia, without obstruction or gangrene, not specified as recurrent: Secondary | ICD-10-CM | POA: Diagnosis not present

## 2015-01-01 ENCOUNTER — Ambulatory Visit (INDEPENDENT_AMBULATORY_CARE_PROVIDER_SITE_OTHER): Payer: Medicare Other | Admitting: *Deleted

## 2015-01-01 DIAGNOSIS — Z5181 Encounter for therapeutic drug level monitoring: Secondary | ICD-10-CM

## 2015-01-01 DIAGNOSIS — I482 Chronic atrial fibrillation, unspecified: Secondary | ICD-10-CM

## 2015-01-01 LAB — POCT INR: INR: 1.7

## 2015-02-10 ENCOUNTER — Ambulatory Visit (INDEPENDENT_AMBULATORY_CARE_PROVIDER_SITE_OTHER): Payer: Medicare Other | Admitting: *Deleted

## 2015-02-10 DIAGNOSIS — Z5181 Encounter for therapeutic drug level monitoring: Secondary | ICD-10-CM

## 2015-02-10 DIAGNOSIS — I482 Chronic atrial fibrillation, unspecified: Secondary | ICD-10-CM

## 2015-02-10 LAB — POCT INR: INR: 1.7

## 2015-02-16 ENCOUNTER — Other Ambulatory Visit: Payer: Self-pay | Admitting: *Deleted

## 2015-02-16 MED ORDER — WARFARIN SODIUM 5 MG PO TABS: 5.0000 mg | ORAL_TABLET | ORAL | Status: DC

## 2015-03-03 ENCOUNTER — Ambulatory Visit (INDEPENDENT_AMBULATORY_CARE_PROVIDER_SITE_OTHER): Payer: Medicare Other | Admitting: *Deleted

## 2015-03-03 DIAGNOSIS — I482 Chronic atrial fibrillation, unspecified: Secondary | ICD-10-CM

## 2015-03-03 DIAGNOSIS — Z5181 Encounter for therapeutic drug level monitoring: Secondary | ICD-10-CM

## 2015-03-03 LAB — POCT INR: INR: 2.3

## 2015-03-19 ENCOUNTER — Other Ambulatory Visit: Payer: Self-pay | Admitting: Cardiology

## 2015-05-07 ENCOUNTER — Ambulatory Visit (INDEPENDENT_AMBULATORY_CARE_PROVIDER_SITE_OTHER): Payer: Medicare Other

## 2015-05-07 ENCOUNTER — Ambulatory Visit (INDEPENDENT_AMBULATORY_CARE_PROVIDER_SITE_OTHER): Payer: Medicare Other | Admitting: Cardiology

## 2015-05-07 ENCOUNTER — Encounter: Payer: Self-pay | Admitting: Cardiology

## 2015-05-07 VITALS — BP 145/76 | HR 55 | Ht 68.0 in | Wt 204.1 lb

## 2015-05-07 DIAGNOSIS — I059 Rheumatic mitral valve disease, unspecified: Secondary | ICD-10-CM

## 2015-05-07 DIAGNOSIS — I482 Chronic atrial fibrillation, unspecified: Secondary | ICD-10-CM

## 2015-05-07 DIAGNOSIS — I359 Nonrheumatic aortic valve disorder, unspecified: Secondary | ICD-10-CM | POA: Diagnosis not present

## 2015-05-07 DIAGNOSIS — Z5181 Encounter for therapeutic drug level monitoring: Secondary | ICD-10-CM | POA: Diagnosis not present

## 2015-05-07 LAB — POCT INR: INR: 1.9

## 2015-05-07 NOTE — Patient Instructions (Signed)
Your physician recommends that you continue on your current medications as directed. Please refer to the Current Medication list given to you today. Your physician recommends that you schedule a follow-up appointment in: 6 months. You will receive a reminder letter in the mail in about 4 months reminding you to call and schedule your appointment. If you don't receive this letter, please contact our office. 

## 2015-05-07 NOTE — Progress Notes (Signed)
Cardiology Office Note  Date: 05/07/2015   ID: Phillip BaneRufus Wyley Nolan, DOB 14-Oct-1927, MRN [id number]  PCP: No primary care provider on file.  Primary Cardiologist: Nona DellSamuel Quentin Strebel, MD   Chief Complaint  Patient presents with  . Atrial Fibrillation  . Valvular heart disease    History of Present Illness: Phillip Nolan is an 81 y.o. male last seen in April. At that visit we reduced his atenolol dose due to bradycardia. He comes in today for a routine follow-up visit, does not report any major change in stamina, no dizziness or syncope.  He continues on Coumadin with inconsistent follow-up in anticoagulation clinic. This has been discussed with him. Recheck INR today was 1.9 and adjustments were made. He does not report any bleeding problems.  He states that he walks on a treadmill at home for exercise. 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 Still reading the Bible regularly and trying to memorize portions of it.   Past Medical History  Diagnosis Date  . History of bacteremia     Streptococcus gordonii bacteremia 2/11 - no clear vegetation by TEE  . Essential hypertension, benign   . Atrial fibrillation (HCC)   . BPH (benign prostatic hyperplasia)   . Coronary atherosclerosis of native coronary artery     Nonobstructive  . Aortic valve disease     Status post AVR 2011  . Mitral valve disease     Status post annular repair 2011    Past Surgical History  Procedure Laterality Date  . Median sterntomy  4/11    Dr. Cornelius Moraswen - AVR (27 mm Edwards pericardial) and MV repair  . Multiple tooth extractions  4/11    (pre-op above)    Current Outpatient Prescriptions  Medication Sig Dispense Refill  . aspirin 81 MG EC tablet Take 81 mg by mouth daily.      Marland Kitchen. atenolol (TENORMIN) 25 MG tablet Take 0.5 tablets (12.5 mg total) by mouth daily. 15 tablet 6  . docusate sodium (COLACE) 100 MG capsule Take 100 mg by mouth daily.    Marland Kitchen. lisinopril (PRINIVIL,ZESTRIL) 5 MG  tablet TAKE (1) TABLET BY MOUTH ONCE DAILY. 30 tablet 9  . warfarin (COUMADIN) 5 MG tablet Take 1 tablet (5 mg total) by mouth as directed. Managed by Misty StanleyLisa. 45 tablet 3   No current facility-administered medications for this visit.    Allergies:  Penicillins   Social History: The patient  reports that he quit smoking about 36 years ago. His smoking use included Pipe and Cigars. He has never used smokeless tobacco. He reports that he does not drink alcohol or use illicit drugs.   ROS:  Please see the history of present illness. Otherwise, complete review of systems is positive for  Decreased hearing.  All other systems are reviewed and negative.   Physical Exam: VS:  BP 145/76 mmHg  Pulse 55  Ht 5\' 8"  (1.727 m)  Wt 204 lb 1.9 oz (92.588 kg)  BMI 31.04 kg/m2  SpO2 98%, BMI Body mass index is 31.04 kg/(m^2).  Wt Readings from Last 3 Encounters:  05/07/15 204 lb 1.9 oz (92.588 kg)  10/21/14 207 lb (93.895 kg)  04/02/14 197 lb (89.359 kg)    Ederly male, disheveled, in no acute distress.  HEENT: Conjunctiva and lids normal, oral pharynx status post multiple extractions. Neck: Supple, no JVD or bruits.  Lungs: Clear to auscultation, nonlabored.  Cardiac: Irregularly irregularr rate and rhythm with split S2, soft systolic murmur at the  base, no S3.  Abdomen: Soft, nontender, bowel sounds present.  Extremities: Venous stasis distally, distal pulses 1+, no pitting edema.    ECG: ECG is not ordered today.  Other Studies Reviewed Today:  Echocardiogram from March 2013 showed LVEF 60-65%, diastolic dysfunction with increased filling pressures, bioprosthetic aortic valve with normal motion, mean gradient 20 mmHg, no obvious paravalvular leak, stable mitral annular ring prosthesis, mild left atrial enlargement.  Assessment and Plan:  1. Chronic atrial fibrillation , maintaining treatment with low-dose atenolol and Coumadin. Reinforced compliance with follow-up in the anticoagulation  clinic. INR today was 1.9, and adjustments were made. He has had no bleeding problems.  2. Valvular heart disease status post bioprosthetic AVR and mitral valve repair in 2011. Echocardiogram from 2013 as outlined above, there is been no significant change in examination. No reported fevers or chills. Continue observation.  Current medicines were reviewed with the patient today.  Disposition: FU with me in 6 months.   Signed, Phillip Sidle, MD, [facility] 05/07/2015 2:11 PM    [facility] at [facility] [address] [place], [place], [place] [address] Phone: [phone]; Fax: [phone]

## 2015-06-27 ENCOUNTER — Other Ambulatory Visit: Payer: Self-pay | Admitting: Cardiology

## 2015-07-30 ENCOUNTER — Other Ambulatory Visit: Payer: Self-pay | Admitting: Cardiology

## 2015-08-25 ENCOUNTER — Telehealth: Payer: Self-pay | Admitting: *Deleted

## 2015-08-25 NOTE — Telephone Encounter (Signed)
Called to set up coumadin appt with patient.   Spoke to daughter Kendal Hymen.  States they have not had any transportation.  She will have a vehicle within the next month and promises to set up appt.  If not pt will have to be taken off coumadin and switched to another agent.

## 2015-12-18 ENCOUNTER — Other Ambulatory Visit: Payer: Self-pay | Admitting: Cardiology

## 2016-01-20 ENCOUNTER — Other Ambulatory Visit: Payer: Self-pay | Admitting: Cardiology

## 2016-02-18 ENCOUNTER — Other Ambulatory Visit: Payer: Self-pay | Admitting: Cardiology

## 2016-02-22 ENCOUNTER — Other Ambulatory Visit: Payer: Self-pay | Admitting: Cardiology

## 2016-02-22 ENCOUNTER — Telehealth: Payer: Self-pay | Admitting: Cardiology

## 2016-02-22 NOTE — Telephone Encounter (Signed)
Multiple attempts made to contact patient today at phone number we have listed in demographics.  Unable to get call to go through.  Note on phone number says "phone is correct, just not working".

## 2016-02-22 NOTE — Telephone Encounter (Signed)
Pt has been noncompliant in coming for INR checks.  See last note from 08/25/15.  I have already denied coumadin refill once with message to daughter Kendal HymenBonnie we would not be able to continue refills without pt coming for INR check.  Last refill was Dec. 2016.  Don't know how they continue to have pills if he is taking regularly.  See if daughter can bring him in for appt.  If not deny medicine.

## 2016-02-22 NOTE — Telephone Encounter (Signed)
warfarin (COUMADIN) 5 MG tablet  Daughter states he has been out of this medication now for 4 days [facility] [place]

## 2016-02-22 NOTE — Telephone Encounter (Signed)
Please advise, looks like he has not been compliant with INR checks.

## 2016-02-24 ENCOUNTER — Other Ambulatory Visit: Payer: Self-pay | Admitting: Cardiology

## 2016-02-25 ENCOUNTER — Encounter: Payer: Self-pay | Admitting: *Deleted

## 2016-02-25 ENCOUNTER — Telehealth: Payer: Self-pay | Admitting: *Deleted

## 2016-02-25 NOTE — Telephone Encounter (Signed)
-----   Message from Jonelle SidleSamuel G McDowell, MD sent at 02/24/2016  7:44 AM EDT ----- Refill Coumadin only limited supply until next visit with me. Please make sure visit is scheduled with me to discuss further. Make clear that we will not continue to refill Coumadin beyond this time without regular followup. Can consider DOAC (even with bioprosthetic AVR) although still needs baseline labwork first (none since last year).  ----- Message ----- From: Louanna RawLisa M Hayven Fatima, RN Sent: 02/23/2016   2:49 PM To: Jonelle SidleSamuel G McDowell, MD  Daughter calling for coumadin refill for pt.  Pt has not been for INR check since 04/2015.  Called daughter 08/2014 and she states she didn't have transportation but would the follow week and would bring pt in.  She states today they still don't transportation but will be getting a vehicle next week.  Told her she told me that 6 months ago.  You discussed coumadin noncompliance with pt at last OV.  He is past due to see you as well.  What do you want me to do re. Coumadin?

## 2016-02-25 NOTE — Telephone Encounter (Signed)
Spoke with Kendal HymenBonnie.  Informed her of Dr McDowell's reply below.  Appt for you pt to see Dr Diona BrownerMcDowell and have INR check on 8/31.  Coumadin refill sent in for 1 month.

## 2016-03-10 ENCOUNTER — Encounter: Payer: Self-pay | Admitting: Cardiology

## 2016-03-17 ENCOUNTER — Encounter: Payer: Self-pay | Admitting: Cardiology

## 2016-03-17 ENCOUNTER — Ambulatory Visit: Payer: Medicare Other | Admitting: Cardiology

## 2016-03-28 ENCOUNTER — Other Ambulatory Visit: Payer: Self-pay | Admitting: Cardiology

## 2016-04-28 ENCOUNTER — Ambulatory Visit (INDEPENDENT_AMBULATORY_CARE_PROVIDER_SITE_OTHER): Payer: Medicare Other | Admitting: *Deleted

## 2016-04-28 DIAGNOSIS — I482 Chronic atrial fibrillation, unspecified: Secondary | ICD-10-CM

## 2016-04-28 DIAGNOSIS — Z5181 Encounter for therapeutic drug level monitoring: Secondary | ICD-10-CM | POA: Diagnosis not present

## 2016-04-28 LAB — POCT INR: INR: 2.5

## 2016-04-28 MED ORDER — WARFARIN SODIUM 5 MG PO TABS: ORAL_TABLET | ORAL | 1 refills | Status: DC

## 2016-05-02 ENCOUNTER — Other Ambulatory Visit: Payer: Self-pay | Admitting: Cardiology

## 2016-06-21 ENCOUNTER — Ambulatory Visit (INDEPENDENT_AMBULATORY_CARE_PROVIDER_SITE_OTHER): Payer: Medicare Other | Admitting: *Deleted

## 2016-06-21 DIAGNOSIS — I482 Chronic atrial fibrillation, unspecified: Secondary | ICD-10-CM

## 2016-06-21 DIAGNOSIS — Z5181 Encounter for therapeutic drug level monitoring: Secondary | ICD-10-CM

## 2016-06-21 LAB — POCT INR: INR: 1.7

## 2016-06-30 ENCOUNTER — Other Ambulatory Visit: Payer: Self-pay | Admitting: Cardiology

## 2016-07-12 ENCOUNTER — Ambulatory Visit (INDEPENDENT_AMBULATORY_CARE_PROVIDER_SITE_OTHER): Payer: Medicare Other | Admitting: Cardiology

## 2016-07-12 ENCOUNTER — Encounter: Payer: Self-pay | Admitting: Cardiology

## 2016-07-12 ENCOUNTER — Ambulatory Visit (INDEPENDENT_AMBULATORY_CARE_PROVIDER_SITE_OTHER): Payer: Medicare Other | Admitting: *Deleted

## 2016-07-12 VITALS — BP 142/70 | Ht 68.5 in | Wt 197.0 lb

## 2016-07-12 DIAGNOSIS — I482 Chronic atrial fibrillation, unspecified: Secondary | ICD-10-CM

## 2016-07-12 DIAGNOSIS — Z5181 Encounter for therapeutic drug level monitoring: Secondary | ICD-10-CM

## 2016-07-12 DIAGNOSIS — I059 Rheumatic mitral valve disease, unspecified: Secondary | ICD-10-CM | POA: Diagnosis not present

## 2016-07-12 DIAGNOSIS — Z9119 Patient's noncompliance with other medical treatment and regimen: Secondary | ICD-10-CM | POA: Diagnosis not present

## 2016-07-12 DIAGNOSIS — Z953 Presence of xenogenic heart valve: Secondary | ICD-10-CM | POA: Diagnosis not present

## 2016-07-12 DIAGNOSIS — Z91199 Patient's noncompliance with other medical treatment and regimen due to unspecified reason: Secondary | ICD-10-CM

## 2016-07-12 LAB — POCT INR: INR: 1.6

## 2016-07-12 NOTE — Patient Instructions (Signed)

## 2016-07-12 NOTE — Progress Notes (Signed)
Cardiology Office Note  Date: 07/12/2016   ID: Phillip SportsRufus Wyley Germer, DOB 09-Mar-1928, MRN [id number]  PCP: No primary care provider on file.  Primary Cardiologist: Nona DellSamuel McDowell, MD   Chief Complaint  Patient presents with  . Atrial Fibrillation  . History of AVR    History of Present Illness: Phillip Nolan is an 82 y.o. male last seen in October 2016. He presents overdue for follow-up. He is here today with his daughter, visiting from [place]. We discussed his issues with follow-up noncompliance. He has had some transportation problems, but these have apparently been corrected recently. He tells me that he remains active with ADLs and has not had any angina.  He continues on Coumadin with intermittent follow-up in the anticoagulation clinic. INR today was 1.6. He does not report any bleeding problems or falls.  Echocardiogram was last done in 2013 as detailed below. We discussed obtaining a follow-up study to reassess status of AVR.  Past Medical History:  Diagnosis Date  . Aortic valve disease    Status post AVR 2011  . Atrial fibrillation (HCC)   . BPH (benign prostatic hyperplasia)   . Coronary atherosclerosis of native coronary artery    Nonobstructive  . Essential hypertension, benign   . History of bacteremia    Streptococcus gordonii bacteremia 2/11 - no clear vegetation by TEE  . Mitral valve disease    Status post annular repair 2011    Past Surgical History:  Procedure Laterality Date  . Median sterntomy  4/11   Dr. Cornelius Moraswen - AVR (27 mm Edwards pericardial) and MV repair  . MULTIPLE TOOTH EXTRACTIONS  4/11   (pre-op above)    Current Outpatient Prescriptions  Medication Sig Dispense Refill  . aspirin 81 MG EC tablet Take 81 mg by mouth daily.      Marland Kitchen. atenolol (TENORMIN) 25 MG tablet TAKE (1/2) TABLET BY MOUTH DAILY. 15 tablet 3  . docusate sodium (COLACE) 100 MG capsule Take 100 mg by mouth daily.    Marland Kitchen. lisinopril (PRINIVIL,ZESTRIL) 5 MG tablet  TAKE (1) TABLET BY MOUTH ONCE DAILY. 30 tablet 3  . warfarin (COUMADIN) 5 MG tablet TAKE 1 TABLET DAILY EXCEPT 1 & 1/2 TABLETS ON MONDAYS, WEDNESDAYS, AND FRIDAYS. 45 tablet 3   No current facility-administered medications for this visit.    Allergies:  Penicillins   Social History: The patient  reports that he quit smoking about 38 years ago. His smoking use included Pipe and Cigars. He has never used smokeless tobacco. He reports that he does not drink alcohol or use drugs.   ROS:  Please see the history of present illness. Otherwise, complete review of systems is positive for diminished hearing.  All other systems are reviewed and negative.   Physical Exam: VS:  BP (!) 142/70   Ht 5' 8.5" (1.74 m)   Wt 197 lb (89.4 kg)   SpO2 93%   BMI 29.52 kg/m , BMI Body mass index is 29.52 kg/m.  Wt Readings from Last 3 Encounters:  07/12/16 197 lb (89.4 kg)  05/07/15 204 lb 1.9 oz (92.6 kg)  10/21/14 207 lb (93.9 kg)    Elderly male, disheveled, in no acute distress.  HEENT: Conjunctiva and lids normal, oropharynx clear. Neck: Supple, no JVD or bruits.  Lungs: Clear to auscultation, nonlabored.  Cardiac: Irregularly irregularr rate and rhythm with split S2, soft systolic murmur at the base, no S3.  Abdomen: Soft, nontender, bowel sounds present.  Extremities: Venous stasis distally, distal  pulses 1+, no pitting edema.   ECG: I personally reviewed the tracing from 10/03/2014 which showed atrial fibrillation with right bundle branch block.  Recent Labwork:  March 2016: BUN 23, creatinine 0.9, potassium 4.2, hemoglobin 13.2, pulse 119  Other Studies Reviewed Today:  Echocardiogram 10/13/2011: Study Conclusions  - Left ventricle: The cavity size was normal. Wall thickness was normal. Systolic function was normal. The estimated ejection fraction was in the range of 60% to 65%. Wall motion was normal; there were no regional wall motion abnormalities. There was a reduced  contribution of atrial contraction to ventricular filling, due to increased ventricular diastolic pressure or atrial contractile dysfunction. Left ventricular diastolic function parameters were normal. Doppler parameters are consistent with elevated mean left atrial filling pressure. - Aortic valve: A bioprosthesis was present. The prosthesis had a normal range of motion. The sewing ring appeared normal. There was no pannus formation. Valve area: 1.06cm^2(VTI). Valve area: 1.27cm^2 (Vmax). - Mitral valve: An annular ring prosthesis was present. - Left atrium: The atrium was mildly dilated.  Assessment and Plan:  1. Chronic atrial fibrillation. He continues on Coumadin although remains noncompliant with his regular follow-up. We discussed this again, it sounds like there were some transportation issues involved although these have been corrected. Could always consider DOAC, but have been somewhat reluctant in light of his valvular heart disease.  2. Aortic stenosis status post bioprosthetic aVR in 2011. Last echocardiogram was in 2013. We'll obtain a follow-up study.  3. History of mitral valve disease status post annular repair at the time of AVR.  4. Health maintenance, patient has no primary care provider. I have recommended that he establish with one many times including today.  Current medicines were reviewed with the patient today.   Orders Placed This Encounter  Procedures  . EKG 12-Lead  . ECHOCARDIOGRAM COMPLETE    Disposition: Follow-up in 6 months.  Signed, Phillip SidleSamuel G. McDowell, MD, [facility] 07/12/2016 12:02 PM    [facility] [address], [place], [place] [address] Phone: [phone]) [phone]; Fax: [phone](336) [phone]

## 2016-07-22 ENCOUNTER — Other Ambulatory Visit: Payer: Self-pay | Admitting: Cardiology

## 2016-08-17 ENCOUNTER — Ambulatory Visit (INDEPENDENT_AMBULATORY_CARE_PROVIDER_SITE_OTHER): Payer: Medicare Other

## 2016-08-17 ENCOUNTER — Other Ambulatory Visit: Payer: Self-pay

## 2016-08-17 ENCOUNTER — Ambulatory Visit (INDEPENDENT_AMBULATORY_CARE_PROVIDER_SITE_OTHER): Payer: Medicare Other | Admitting: *Deleted

## 2016-08-17 DIAGNOSIS — Z5181 Encounter for therapeutic drug level monitoring: Secondary | ICD-10-CM

## 2016-08-17 DIAGNOSIS — I482 Chronic atrial fibrillation, unspecified: Secondary | ICD-10-CM

## 2016-08-17 DIAGNOSIS — Z953 Presence of xenogenic heart valve: Secondary | ICD-10-CM

## 2016-08-17 LAB — PROTIME-INR: INR: 1.9 — AB (ref 0.9–1.1)

## 2016-08-18 LAB — POCT INR
INR: 1.9
INR: 1.9
INR: 1.9

## 2016-08-19 ENCOUNTER — Telehealth: Payer: Self-pay | Admitting: *Deleted

## 2016-08-19 NOTE — Telephone Encounter (Signed)
Patient informed and copy sent to PCP. 

## 2016-08-19 NOTE — Telephone Encounter (Signed)
-----   Message from Jonelle SidleSamuel G McDowell, MD sent at 08/17/2016 12:50 PM EST ----- Results reviewed. LVEF 45-50%, mildly reduced in comparison to previous study. Bioprosthetic aortic valve and mitral valve repair look to be functioning adequately. Continue with regular follow-up.

## 2016-10-05 ENCOUNTER — Other Ambulatory Visit: Payer: Self-pay | Admitting: Cardiology

## 2016-11-03 ENCOUNTER — Other Ambulatory Visit: Payer: Self-pay | Admitting: Cardiology

## 2016-12-08 ENCOUNTER — Telehealth: Payer: Self-pay | Admitting: Cardiology

## 2016-12-08 NOTE — Telephone Encounter (Signed)
Patient wanted to speak with Misty StanleyLisa in reference to patient's son (only transportation) being killed this past week in car wreck.  Stated that they do not have money to use [place] or [place] transportation.  She wanted to know if we could order for a home health nurse to come out and do his INR checks  They are aware that Misty StanleyLisa will not be back in to our office until Tuesday.

## 2016-12-20 NOTE — Telephone Encounter (Signed)
Left message for pt/daughter in law that insurance would not pay for home health to come out to do an INR only.   Pt has to have some medical need for Home Health services.  Gave her examples and told her to contact PCP if any of the exist.  Told her to think it over and let me know what they want to do but INR does need to be checked ASAP.

## 2017-01-02 ENCOUNTER — Other Ambulatory Visit: Payer: Self-pay | Admitting: Cardiology

## 2017-02-17 ENCOUNTER — Other Ambulatory Visit: Payer: Self-pay | Admitting: Cardiology

## 2017-02-28 ENCOUNTER — Ambulatory Visit (INDEPENDENT_AMBULATORY_CARE_PROVIDER_SITE_OTHER): Payer: Medicare Other | Admitting: *Deleted

## 2017-02-28 DIAGNOSIS — I482 Chronic atrial fibrillation, unspecified: Secondary | ICD-10-CM

## 2017-02-28 DIAGNOSIS — Z5181 Encounter for therapeutic drug level monitoring: Secondary | ICD-10-CM

## 2017-02-28 LAB — POCT INR: INR: 1.5

## 2017-03-02 ENCOUNTER — Telehealth: Payer: Self-pay | Admitting: Cardiology

## 2017-03-02 MED ORDER — WARFARIN SODIUM 5 MG PO TABS: ORAL_TABLET | ORAL | 1 refills | Status: DC

## 2017-03-02 NOTE — Telephone Encounter (Signed)
[facility]

## 2017-03-02 NOTE — Telephone Encounter (Signed)
Rx sent 

## 2017-04-03 ENCOUNTER — Other Ambulatory Visit: Payer: Self-pay | Admitting: Cardiology

## 2017-05-01 ENCOUNTER — Other Ambulatory Visit: Payer: Self-pay | Admitting: Cardiology

## 2017-06-01 ENCOUNTER — Other Ambulatory Visit: Payer: Self-pay | Admitting: Cardiology

## 2017-06-27 ENCOUNTER — Other Ambulatory Visit: Payer: Self-pay | Admitting: Cardiology

## 2017-07-10 ENCOUNTER — Telehealth: Payer: Self-pay | Admitting: Cardiology

## 2017-07-10 MED ORDER — LISINOPRIL 5 MG PO TABS: ORAL_TABLET | ORAL | 0 refills | Status: DC

## 2017-07-10 MED ORDER — ATENOLOL 25 MG PO TABS: ORAL_TABLET | ORAL | 0 refills | Status: DC

## 2017-07-10 NOTE — Telephone Encounter (Signed)
[facility]. Will forward to coumadin - pt has not been seen for INR check since August

## 2017-07-10 NOTE — Telephone Encounter (Signed)
Patient called stating that he has upcoming appointment 08/03/17 -needs to have Refills on Coumadin, lisinopril and Atenolol.  He will run out on Wednesday  07/13/17.

## 2017-07-12 MED ORDER — WARFARIN SODIUM 5 MG PO TABS: ORAL_TABLET | ORAL | 0 refills | Status: DC

## 2017-07-12 NOTE — Addendum Note (Signed)
Addended by: Louanna RawEID, LISA M on: 07/12/2017 07:55 AM   Modules accepted: Orders

## 2017-08-01 NOTE — Progress Notes (Signed)
Cardiology Office Note  Date: 08/03/2017   ID: Jonelle Sports, DOB [birth year], MRN [id number]  PCP: Patient, No Pcp Per  Primary Cardiologist: Nona Dell, MD   Chief Complaint  Patient presents with  . Atrial Fibrillation  . Valvular heart disease    History of Present Illness: Phillip Nolan is an 84 y.o. male last seen in December 2017. He presents overdue for follow-up. Noncompliance has been a recurring issue including with Coumadin clinic. He reports no follow-up with a PCP and has difficulty with transportation. He tells me that he has had no significant chest pain or palpitations, denies any falls or bleeding problems. He states that he is using a Equities trader at home.  I reviewed his medications which are listed below. He states that he is taking these regularly including Coumadin. PT/INR was checked today at 1.6 and follow-up made.  Follow-up echocardiogram in January 2018 revealed LVEF 45-50% with stable function of bioprosthetic aortic valve and only trivial mitral regurgitation following annuloplasty.  Past Medical History:  Diagnosis Date  . Aortic valve disease    Status post AVR 2011  . Atrial fibrillation (HCC)   . BPH (benign prostatic hyperplasia)   . Coronary atherosclerosis of native coronary artery    Nonobstructive  . Essential hypertension, benign   . History of bacteremia    Streptococcus gordonii bacteremia 2/11 - no clear vegetation by TEE  . Mitral valve disease    Status post annular repair 2011    Past Surgical History:  Procedure Laterality Date  . Median sterntomy  4/11   Dr. Cornelius Moras - AVR (27 mm Edwards pericardial) and MV repair  . MULTIPLE TOOTH EXTRACTIONS  4/11   (pre-op above)    Current Outpatient Medications  Medication Sig Dispense Refill  . aspirin 81 MG EC tablet Take 81 mg by mouth daily.      Marland Kitchen atenolol (TENORMIN) 25 MG tablet TAKE 1/2 TABLET ONCE A DAY. NEED APPT FOR FURTHER REFILLS. 15 tablet 0  .  docusate sodium (COLACE) 100 MG capsule Take 100 mg by mouth daily.    Marland Kitchen lisinopril (PRINIVIL,ZESTRIL) 5 MG tablet TAKE (1) TABLET BY MOUTH ONCE DAILY . 30 tablet 0  . warfarin (COUMADIN) 5 MG tablet TAKE 1 AND 1/2 TABLETS ONCE A DAY OR AS DIRECTED BY COUMADIN CLINIC. NEED APPT FOR FURTHER REFILLS. 20 tablet 0   No current facility-administered medications for this visit.    Allergies:  Penicillins   Social History: The patient  reports that he quit smoking about 39 years ago. His smoking use included pipe and cigars. he has never used smokeless tobacco. He reports that he does not drink alcohol or use drugs.   ROS:  Please see the history of present illness. Otherwise, complete review of systems is positive for hearing loss.  All other systems are reviewed and negative.   Physical Exam: VS:  BP 130/60   Pulse (!) 53   Ht 5' 8.5" (1.74 m)   Wt 200 lb (90.7 kg)   SpO2 93%   BMI 29.97 kg/m , BMI Body mass index is 29.97 kg/m.  Wt Readings from Last 3 Encounters:  08/03/17 200 lb (90.7 kg)  07/12/16 197 lb (89.4 kg)  05/07/15 204 lb 1.9 oz (92.6 kg)    General: Elderly male, no distress. HEENT: Conjunctiva and lids normal, oropharynx clear. Neck: Supple, no elevated JVP or carotid bruits, no thyromegaly. Lungs: Clear to auscultation, nonlabored breathing at rest. Cardiac: Regular  rate and rhythm, no S3, 2/6 apical systolic murmur, no pericardial rub. Abdomen: Obese, nontender, bowel sounds present. Extremities: Venous stasis, distal pulses 1-2+. Skin: Warm and dry. Musculoskeletal: No kyphosis. Neuropsychiatric: Alert and oriented x3, affect grossly appropriate.  ECG: I personally reviewed the tracing from 07/12/2016 which showed atrial tachycardia/flutter with variable conduction and slow heart rate, right bundle branch block and left anterior fascicular block.  Recent Labwork:  No recent lab work.  Other Studies Reviewed Today:  Echocardiogram 08/17/2016: Study  Conclusions  - Left ventricle: The cavity size was normal. Wall thickness was   increased in a pattern of mild LVH. Systolic function was mildly   reduced. The estimated ejection fraction was in the range of 45%   to 50%. The study is not technically sufficient to allow   evaluation of LV diastolic function. - Regional wall motion abnormality: Hypokinesis of the basal-mid   anteroseptal myocardium. - Aortic valve: A 27 mm Edwards pericardial tissue valve is present   in the AV position. Poorly visualized. There was no significant   regurgitation. Mean gradient (S): 10 mm Hg. Valve area (VTI):   1.97 cm^2. - Mitral valve: A 36 mm Sorin memo 3D annuloplasty ring is present   at the MV anulus. Mildly thickened leaflets . There was trivial   regurgitation. Mean gradient (D): 1 mm Hg. - Left atrium: The atrium was severely dilated. - Right ventricle: Grossly appears mildly dilated with probably low   normal systolic function. - Right atrium: The atrium was mildly dilated. - Tricuspid valve: There was mild regurgitation. - Pulmonary arteries: PA peak pressure: 29 mm Hg (S). - Pericardium, extracardiac: There was no pericardial effusion. - Technically difficult study.  Impressions:  - Mild LVH with LVEF approximately 45-50%, indeterminate diastolic   function. Severe left atrial enlargement. Mitral annuloplasty   ring noted with mildly thickened leaflets and trivial mitral   regurgitation. Bioprosthetic aortic valve in place without   significant perivalvular leak and normal gradient. Mildly dilated   right ventricle with preserved contraction. Mild tricuspid   regurgitation with PASP estimated 29 mmHg.  Assessment and Plan:  1. Chronic atrial fibrillation. He does not report any palpitations. He remains noncompliant with follow-up in the anticoagulation clinic on Coumadin, states that he continues to take Coumadin and reports no bleeding problems. INR was 1.6 today. We reinforced  regular follow-up, he had a friend bring him to the office today and states he will work on transportation issues.   2. Aortic stenosis status post bioprosthetic AVR in 2011. Echocardiogram from last year showed normal gradients.  3. Trivial mitral regurgitation by echocardiogram last year following previous mitral annular repair.   Current medicines were reviewed with the patient today.   Orders Placed This Encounter  Procedures  . EKG 12-Lead    Disposition: Follow-up in 6 months.  Signed, Jonelle SidleSamuel G. Milah Recht, MD, [facility] 08/03/2017 12:21 PM    [facility] [address], [place], [place] [address] Phone: [phone](336) [phone]; Fax: [phone](336) [phone]

## 2017-08-03 ENCOUNTER — Encounter: Payer: Self-pay | Admitting: Cardiology

## 2017-08-03 ENCOUNTER — Other Ambulatory Visit: Payer: Self-pay | Admitting: *Deleted

## 2017-08-03 ENCOUNTER — Ambulatory Visit (INDEPENDENT_AMBULATORY_CARE_PROVIDER_SITE_OTHER): Payer: Medicare Other | Admitting: Cardiology

## 2017-08-03 ENCOUNTER — Ambulatory Visit (INDEPENDENT_AMBULATORY_CARE_PROVIDER_SITE_OTHER): Payer: Medicare Other | Admitting: *Deleted

## 2017-08-03 VITALS — BP 130/60 | HR 53 | Ht 68.5 in | Wt 200.0 lb

## 2017-08-03 DIAGNOSIS — Z5181 Encounter for therapeutic drug level monitoring: Secondary | ICD-10-CM | POA: Diagnosis not present

## 2017-08-03 DIAGNOSIS — I4891 Unspecified atrial fibrillation: Secondary | ICD-10-CM

## 2017-08-03 DIAGNOSIS — I059 Rheumatic mitral valve disease, unspecified: Secondary | ICD-10-CM | POA: Diagnosis not present

## 2017-08-03 DIAGNOSIS — I482 Chronic atrial fibrillation, unspecified: Secondary | ICD-10-CM

## 2017-08-03 DIAGNOSIS — Z953 Presence of xenogenic heart valve: Secondary | ICD-10-CM

## 2017-08-03 LAB — POCT INR: INR: 1.6

## 2017-08-03 MED ORDER — ATENOLOL 25 MG PO TABS: 12.5000 mg | ORAL_TABLET | Freq: Every day | ORAL | 1 refills | Status: DC

## 2017-08-03 MED ORDER — LISINOPRIL 5 MG PO TABS: ORAL_TABLET | ORAL | 1 refills | Status: DC

## 2017-08-03 MED ORDER — WARFARIN SODIUM 5 MG PO TABS: ORAL_TABLET | ORAL | 0 refills | Status: DC

## 2017-08-03 NOTE — Patient Instructions (Signed)
Take coumadin 2  tablets tonight and tomorrow night then increase dose to 1 1/2 tablets daily except 2 tablets on Mondays and Thursdays Recheck in 3 weeks States he will had a car then

## 2017-08-03 NOTE — Patient Instructions (Signed)

## 2017-10-06 ENCOUNTER — Other Ambulatory Visit: Payer: Self-pay | Admitting: Cardiology

## 2017-12-06 ENCOUNTER — Other Ambulatory Visit: Payer: Self-pay | Admitting: Cardiology

## 2017-12-26 ENCOUNTER — Other Ambulatory Visit: Payer: Self-pay | Admitting: Cardiology

## 2017-12-29 ENCOUNTER — Other Ambulatory Visit: Payer: Self-pay | Admitting: *Deleted

## 2017-12-29 MED ORDER — WARFARIN SODIUM 5 MG PO TABS: ORAL_TABLET | ORAL | 0 refills | Status: DC

## 2018-01-09 ENCOUNTER — Other Ambulatory Visit: Payer: Self-pay | Admitting: Cardiology

## 2018-01-09 NOTE — Telephone Encounter (Signed)
Pt is overdue. Spoke with pt daughter who states that they do not have transportation and he does have an appt on 7/30 with Dr. Diona BrownerMcDowell visit. Advised that he is 5 months overdue and we really need to check INR as could be very dangerous if too low due to clot/stroke or too high due to bleeding. Emphasized heavily how important monitoring is an how important to take warfarin due to slot/stroke risk. She agrees to try to bring him next week for check. Have sent in refill for 1 week supply to last until next week as he took last dose today per her report. She asked about HH to come to home since transportation is an issue. I [facility], but that primary care physician [facility] need [facility]. She states she will work on getting this set up if able. The pt will need to est with a GP.

## 2018-01-29 ENCOUNTER — Other Ambulatory Visit: Payer: Self-pay | Admitting: Cardiology

## 2018-01-29 ENCOUNTER — Telehealth: Payer: Self-pay | Admitting: *Deleted

## 2018-01-29 MED ORDER — WARFARIN SODIUM 5 MG PO TABS: ORAL_TABLET | ORAL | 0 refills | Status: DC

## 2018-01-29 NOTE — Telephone Encounter (Signed)
Warfarin refill received; pt is overdue for an Anticoagulation appt and was last seen on 08/03/17. Called pt and left a message to callback to schedule an appt in the [place] office since he is overdue. Patient called back and made an appt on 02/13/18; will send in refill until appt in the Anticoagulation Clinic Appt.

## 2018-01-29 NOTE — Telephone Encounter (Signed)
Warfarin refill received; pt is overdue for an Anticoagulation appt and was last seen on 08/03/17. Called pt and left a message to callback to schedule an appt in the [place] office since he is overdue.

## 2018-01-29 NOTE — Telephone Encounter (Signed)
°*  STAT* If patient is at the pharmacy, call can be transferred to refill team.   1. Which medications need to be refilled? warfarin (COUMADIN) 5 MG tablet    2. Which pharmacy/location (including street and city if local pharmacy) is medication to be sent to? [facility]  3. Do they need a 30 day or 90 day supply?

## 2018-02-12 ENCOUNTER — Encounter: Payer: Self-pay | Admitting: Cardiology

## 2018-02-12 NOTE — Progress Notes (Signed)
Cardiology Office Note  Date: 02/13/2018   ID: Phillip SportsRufus Wyley Andreoli, DOB Apr 04, 1928, MRN [id number]  PCP: Patient, No Pcp Per  Primary Cardiologist: Nona DellSamuel Chelcie Estorga, MD   Chief Complaint  Patient presents with  . Atrial Fibrillation    History of Present Illness: Phillip Nolan is a 84 y.o. male last seen in January.  He presents for a follow-up visit.  Reports no angina symptoms or palpitations, no dizziness or syncope.  He tells me that he uses 8 pound weights to exercise his arms, also do some stretching exercises.  He does this on a daily basis.  He also has a [employer] track machine.  He remains noncompliant with Coumadin follow-up in the anticoagulation clinic.  INR checked today was 3.2.  He does not report any bleeding problems.  I did talk with him about being consistent with having his PT/INR checked.  We could always consider putting him on a DOAC, but this would not be optimal in light of his valvular heart disease.  Past Medical History:  Diagnosis Date  . Aortic valve disease    Status post AVR 2011  . Atrial fibrillation (HCC)   . BPH (benign prostatic hyperplasia)   . Coronary atherosclerosis of native coronary artery    Nonobstructive  . Essential hypertension   . History of bacteremia    Streptococcus gordonii bacteremia 2/11 - no clear vegetation by TEE  . Mitral valve disease    Status post annular repair 2011    Past Surgical History:  Procedure Laterality Date  . Median sterntomy  4/11   Dr. Cornelius Moraswen - AVR (27 mm Edwards pericardial) and MV repair  . MULTIPLE TOOTH EXTRACTIONS  4/11   (pre-op above)    Current Outpatient Medications  Medication Sig Dispense Refill  . aspirin 81 MG EC tablet Take 81 mg by mouth daily.      Marland Kitchen. atenolol (TENORMIN) 25 MG tablet Take 0.5 tablets (12.5 mg total) by mouth daily. 45 tablet 1  . docusate sodium (COLACE) 100 MG capsule Take 100 mg by mouth daily.    Marland Kitchen. lisinopril (PRINIVIL,ZESTRIL) 5 MG tablet TAKE (1)  TABLET BY MOUTH ONCE DAILY . 90 tablet 1  . warfarin (COUMADIN) 5 MG tablet Take 1 1/2 tablets daily except 2 tablets on Mondays and Thursdays 60 tablet 1   No current facility-administered medications for this visit.    Allergies:  Penicillins   Social History: The patient  reports that he quit smoking about 39 years ago. His smoking use included pipe and cigars. He has never used smokeless tobacco. He reports that he does not drink alcohol or use drugs.   ROS:  Please see the history of present illness. Otherwise, complete review of systems is positive for hearing loss.  All other systems are reviewed and negative.   Physical Exam: VS:  BP (!) 181/71   Pulse (!) 48   Ht 5' 8.5" (1.74 m)   Wt 189 lb 3.2 oz (85.8 kg)   SpO2 99%   BMI 28.35 kg/m , BMI Body mass index is 28.35 kg/m.  Wt Readings from Last 3 Encounters:  02/13/18 189 lb 3.2 oz (85.8 kg)  08/03/17 200 lb (90.7 kg)  07/12/16 197 lb (89.4 kg)    General: Elderly somewhat disheveled male, appears comfortable at rest. HEENT: Conjunctiva and lids normal, oropharynx clear. Neck: Supple, no elevated JVP or carotid bruits, no thyromegaly. Lungs: Clear to auscultation, nonlabored breathing at rest. Cardiac: Irregularly irregular, no S3,  2/6 systolic murmur. Abdomen: Soft, nontender, bowel sounds present. Extremities: Venous stasis, distal pulses 2+. Skin: Warm and dry. Musculoskeletal: No kyphosis. Neuropsychiatric: Alert and oriented x3, affect grossly appropriate.  ECG: I personally reviewed the tracing from 08/03/2017 which showed slow atrial fibrillation with right bundle branch block, PVC and repolarization changes.   Other Studies Reviewed Today:  Echocardiogram 08/17/2016: Study Conclusions  - Left ventricle: The cavity size was normal. Wall thickness was increased in a pattern of mild LVH. Systolic function was mildly reduced. The estimated ejection fraction was in the range of 45% to 50%. The study is  not technically sufficient to allow evaluation of LV diastolic function. - Regional wall motion abnormality: Hypokinesis of the basal-mid anteroseptal myocardium. - Aortic valve: A 27 mm Edwards pericardial tissue valve is present in the AV position. Poorly visualized. There was no significant regurgitation. Mean gradient (S): 10 mm Hg. Valve area (VTI): 1.97 cm^2. - Mitral valve: A 36 mm Sorin memo 3D annuloplasty ring is present at the MV anulus. Mildly thickened leaflets . There was trivial regurgitation. Mean gradient (D): 1 mm Hg. - Left atrium: The atrium was severely dilated. - Right ventricle: Grossly appears mildly dilated with probably low normal systolic function. - Right atrium: The atrium was mildly dilated. - Tricuspid valve: There was mild regurgitation. - Pulmonary arteries: PA peak pressure: 29 mm Hg (S). - Pericardium, extracardiac: There was no pericardial effusion. - Technically difficult study.  Impressions:  - Mild LVH with LVEF approximately 45-50%, indeterminate diastolic function. Severe left atrial enlargement. Mitral annuloplasty ring noted with mildly thickened leaflets and trivial mitral regurgitation. Bioprosthetic aortic valve in place without significant perivalvular leak and normal gradient. Mildly dilated right ventricle with preserved contraction. Mild tricuspid regurgitation with PASP estimated 29 mmHg.  Assessment and Plan:  1.  Permanent atrial fibrillation.  I discussed compliance with Coumadin and follow-up for PT/INR levels.  INR was 3.2 today with dose adjusted.  As noted above we could consider switch to a DOAC, but this would not be optimal in light of his valvular heart disease.  2.  Aortic stenosis status post bioprosthetic AVR in 2011.  Echocardiogram from last year showed stable gradients.  Current medicines were reviewed with the patient today.  Disposition: Follow up in 6 months.   Signed, Phillip Sidle, MD, [facility] 02/13/2018 4:38 PM     [facility] [address] [place], [place], [place] [address] Phone: [phone]; Fax: [phone]

## 2018-02-13 ENCOUNTER — Ambulatory Visit (INDEPENDENT_AMBULATORY_CARE_PROVIDER_SITE_OTHER): Payer: Medicare Other | Admitting: Cardiology

## 2018-02-13 ENCOUNTER — Ambulatory Visit (INDEPENDENT_AMBULATORY_CARE_PROVIDER_SITE_OTHER): Payer: Medicare Other | Admitting: *Deleted

## 2018-02-13 ENCOUNTER — Encounter: Payer: Self-pay | Admitting: Cardiology

## 2018-02-13 VITALS — BP 181/71 | HR 48 | Ht 68.5 in | Wt 189.2 lb

## 2018-02-13 DIAGNOSIS — I482 Chronic atrial fibrillation: Secondary | ICD-10-CM | POA: Diagnosis not present

## 2018-02-13 DIAGNOSIS — Z953 Presence of xenogenic heart valve: Secondary | ICD-10-CM

## 2018-02-13 DIAGNOSIS — I4891 Unspecified atrial fibrillation: Secondary | ICD-10-CM | POA: Diagnosis not present

## 2018-02-13 DIAGNOSIS — Z5181 Encounter for therapeutic drug level monitoring: Secondary | ICD-10-CM | POA: Diagnosis not present

## 2018-02-13 DIAGNOSIS — I4821 Permanent atrial fibrillation: Secondary | ICD-10-CM

## 2018-02-13 LAB — POCT INR: INR: 3.2 — AB (ref 2.0–3.0)

## 2018-02-13 MED ORDER — WARFARIN SODIUM 5 MG PO TABS: ORAL_TABLET | ORAL | 1 refills | Status: DC

## 2018-02-13 NOTE — Patient Instructions (Signed)
Take coumadin 1 tablet tonight  then continue 1 1/2 tablets daily except 2 tablets on Mondays and Thursdays Recheck in 4 weeks

## 2018-02-13 NOTE — Patient Instructions (Signed)
Medication Instructions:  Your physician recommends that you continue on your current medications as directed. Please refer to the Current Medication list given to you today.  Labwork: NONE  Testing/Procedures: NONE  Follow-Up: Your physician wants you to follow-up in: 6 MONTHS WITH DR. Diona BrownerMcDOWELL. You will receive a reminder letter in the mail two months in advance. If you don't receive a letter, please call our office to schedule the follow-up appointment.  Any Other Special Instructions Will Be Listed Below (If Applicable).  If you need a refill on your cardiac medications before your next appointment, please call your pharmacy.

## 2018-02-28 ENCOUNTER — Other Ambulatory Visit: Payer: Self-pay | Admitting: Cardiology

## 2018-04-11 ENCOUNTER — Other Ambulatory Visit: Payer: Self-pay | Admitting: Cardiology

## 2018-04-12 NOTE — Telephone Encounter (Signed)
Done. 15 day supply of coumadin sent in.  No reills until INR check.

## 2018-05-18 ENCOUNTER — Other Ambulatory Visit: Payer: Self-pay | Admitting: Cardiology

## 2018-06-26 ENCOUNTER — Other Ambulatory Visit: Payer: Self-pay | Admitting: Cardiology

## 2018-06-27 ENCOUNTER — Telehealth: Payer: Self-pay | Admitting: Cardiology

## 2018-06-27 NOTE — Telephone Encounter (Signed)
Patient is out of medication as of yesterday and needs more refills sent into [facility] in [place] per [place].

## 2018-06-28 ENCOUNTER — Telehealth: Payer: Self-pay | Admitting: *Deleted

## 2018-06-28 MED ORDER — WARFARIN SODIUM 5 MG PO TABS: ORAL_TABLET | ORAL | 0 refills | Status: DC

## 2018-06-28 NOTE — Telephone Encounter (Signed)
Let message on Bonnie's voicemail that pt has not be seen for coumadin check since July,2019 and this is not safe.  Explained that this would be the last time warfarin is called in if she does not bring him for INR checks.  Told her if transportation continues to be an issue we will have to discuss alternate medicines with Dr Diona BrownerMcDowell.  INR appt made for 07/26/2018.  Warfarin 5mg  #50 tablets sent to Pharmacy with no refills.

## 2018-06-28 NOTE — Telephone Encounter (Signed)
Spoke with Kendal HymenBonnie.  Told her I sent in enough coumadin to last until his appt in January.

## 2018-06-28 NOTE — Telephone Encounter (Signed)
Patient called the office in regards to speaking with Vashti HeyLisa Reid, RN about the coumdin refill.

## 2018-07-28 ENCOUNTER — Other Ambulatory Visit: Payer: Self-pay | Admitting: Cardiology

## 2018-08-23 ENCOUNTER — Ambulatory Visit (INDEPENDENT_AMBULATORY_CARE_PROVIDER_SITE_OTHER): Payer: Medicare Other | Admitting: Pharmacist

## 2018-08-23 DIAGNOSIS — I482 Chronic atrial fibrillation, unspecified: Secondary | ICD-10-CM

## 2018-08-23 DIAGNOSIS — Z5181 Encounter for therapeutic drug level monitoring: Secondary | ICD-10-CM

## 2018-08-23 LAB — POCT INR: INR: 1.1 — AB (ref 2.0–3.0)

## 2018-08-23 MED ORDER — WARFARIN SODIUM 5 MG PO TABS: ORAL_TABLET | ORAL | 0 refills | Status: DC

## 2018-08-23 NOTE — Patient Instructions (Signed)
Description   Take 2.5 tablets today and 2 tablets tomorrow then continue 1 1/2 tablets daily except 2 tablets on Mondays and Thursdays Recheck in 4 weeks

## 2018-09-26 ENCOUNTER — Ambulatory Visit: Payer: Medicare Other | Admitting: Cardiology

## 2018-09-27 ENCOUNTER — Other Ambulatory Visit: Payer: Self-pay | Admitting: Cardiology

## 2018-10-18 ENCOUNTER — Ambulatory Visit: Payer: Medicare Other | Admitting: Cardiology

## 2018-10-30 ENCOUNTER — Telehealth: Payer: Self-pay | Admitting: *Deleted

## 2018-10-30 NOTE — Telephone Encounter (Signed)
Pt daughter in law Kendal Hymen [facility]) verbalized consent for teleheatlh appt via telephone with Dr Diona Browner for 11/05/18 and that insurance would be billed for the encounter. Reviewed medications/allergies/pharmacy. No hospital - will talk to [facility] (pt cannot hear well)

## 2018-11-02 NOTE — Progress Notes (Deleted)
{Choose 1 Note Type (Telehealth Visit or Telephone Visit):[id number]}   Evaluation Performed:  Follow-up visit  Date:  11/02/2018   ID:  Phillip Nolan, DOB 09/26/1927, MRN [id number]  {Patient [phone]::"Home"} {Provider Location:[id number]}  PCP:  Patient, No Pcp Per  Cardiologist:  Phillip SidleSamuel G. Azile Minardi, MD  Chief Complaint:  ***  History of Present Illness:    Phillip Nolan is an 85 y.o. male last seen in July 2019.  He remains on Coumadin with follow-up in the anticoagulation clinic. He has not been consistent with follow-up, last INR was 1.1 in February.  The patient {does/does not:200015} have symptoms concerning for COVID-19 infection (fever, chills, cough, or new shortness of breath).    Past Medical History:  Diagnosis Date  . Aortic valve disease    Status post AVR 2011  . Atrial fibrillation (HCC)   . BPH (benign prostatic hyperplasia)   . Coronary atherosclerosis of native coronary artery    Nonobstructive  . Essential hypertension   . History of bacteremia    Streptococcus gordonii bacteremia 2/11 - no clear vegetation by TEE  . Mitral valve disease    Status post annular repair 2011   Past Surgical History:  Procedure Laterality Date  . Median sterntomy  4/11   Dr. Cornelius Moraswen - AVR (27 mm Edwards pericardial) and MV repair  . MULTIPLE TOOTH EXTRACTIONS  4/11   (pre-op above)     No outpatient medications have been marked as taking for the 11/05/18 encounter (Appointment) with Phillip SidleMcDowell, Myrikal Messmer G, MD.     Allergies:   Penicillins   Social History   Tobacco Use  . Smoking status: Former Smoker    Types: Pipe, Cigars    Last attempt to quit: 07/18/1978    Years since quitting: 40.3  . Smokeless tobacco: Never Used  Substance Use Topics  . Alcohol use: No    Alcohol/week: 0.0 standard drinks  . Drug use: No     Family Hx: The patient's Family history is unknown by patient.  ROS:   Please see the history of present illness.     *** All other systems reviewed and are negative.   Prior CV studies:   The following studies were reviewed today:  Echocardiogram 08/17/2016: Study Conclusions  - Left ventricle: The cavity size was normal. Wall thickness was   increased in a pattern of mild LVH. Systolic function was mildly   reduced. The estimated ejection fraction was in the range of 45%   to 50%. The study is not technically sufficient to allow   evaluation of LV diastolic function. - Regional wall motion abnormality: Hypokinesis of the basal-mid   anteroseptal myocardium. - Aortic valve: A 27 mm Edwards pericardial tissue valve is present   in the AV position. Poorly visualized. There was no significant   regurgitation. Mean gradient (S): 10 mm Hg. Valve area (VTI):   1.97 cm^2. - Mitral valve: A 36 mm Sorin memo 3D annuloplasty ring is present   at the MV anulus. Mildly thickened leaflets . There was trivial   regurgitation. Mean gradient (D): 1 mm Hg. - Left atrium: The atrium was severely dilated. - Right ventricle: Grossly appears mildly dilated with probably low   normal systolic function. - Right atrium: The atrium was mildly dilated. - Tricuspid valve: There was mild regurgitation. - Pulmonary arteries: PA peak pressure: 29 mm Hg (S). - Pericardium, extracardiac: There was no pericardial effusion. - Technically difficult study.  Impressions:  - Mild LVH with LVEF  approximately 45-50%, indeterminate diastolic   function. Severe left atrial enlargement. Mitral annuloplasty   ring noted with mildly thickened leaflets and trivial mitral   regurgitation. Bioprosthetic aortic valve in place without   significant perivalvular leak and normal gradient. Mildly dilated   right ventricle with preserved contraction. Mild tricuspid   regurgitation with PASP estimated 29 mmHg.  Labs/Other Tests and Data Reviewed:    EKG:  An ECG dated 08/03/2017 was personally reviewed today and demonstrated:  Slow  atrial fibrillation with right bundle branch block and PVC.  Recent Labs:    Wt Readings from Last 3 Encounters:  02/13/18 189 lb 3.2 oz (85.8 kg)  08/03/17 200 lb (90.7 kg)  07/12/16 197 lb (89.4 kg)     Objective:    Vital Signs:  There were no vitals taken for this visit.   {HeartCare Virtual Exam (Optional):[id number]::"VITAL SIGNS:  reviewed"}  ASSESSMENT & PLAN:    1. ***  COVID-19 Education: The signs and symptoms of COVID-19 were discussed with the patient and how to seek care for testing (follow up with PCP or arrange E-visit).  ***The importance of social distancing was discussed today.  Time:   Today, I have spent *** minutes with the patient with telehealth technology discussing the above problems.     Medication Adjustments/Labs and Tests Ordered: Current medicines are reviewed at length with the patient today.  Concerns regarding medicines are outlined above.   Tests Ordered: No orders of the defined types were placed in this encounter.   Medication Changes: No orders of the defined types were placed in this encounter.   Disposition:  Follow up {follow up:15908}  Signed, Nona Dell, MD  11/02/2018 1:30 PM    [facility]

## 2018-11-05 ENCOUNTER — Encounter: Payer: Self-pay | Admitting: Cardiology

## 2018-11-05 ENCOUNTER — Telehealth: Payer: Self-pay | Admitting: Cardiology

## 2018-11-05 ENCOUNTER — Telehealth: Payer: Medicare Other | Admitting: Cardiology

## 2018-11-05 NOTE — Telephone Encounter (Signed)
Created in error

## 2018-11-09 ENCOUNTER — Other Ambulatory Visit: Payer: Self-pay | Admitting: Cardiology

## 2018-11-12 ENCOUNTER — Telehealth: Payer: Self-pay | Admitting: Cardiology

## 2018-11-12 NOTE — Telephone Encounter (Signed)
°*  STAT* If patient is at the pharmacy, call can be transferred to refill team.   1. Which medications need to be refilled? warfarin (COUMADIN) 5 MG tablet   2. Which pharmacy/location (including street and city if local pharmacy) is medication to be sent to? [facility]  3. Do they need a 30 day or 90 day supply? 60

## 2018-12-07 ENCOUNTER — Other Ambulatory Visit: Payer: Self-pay | Admitting: Cardiology

## 2018-12-07 NOTE — Telephone Encounter (Signed)
Called and left voicemail- patient overdue for INR check. Will need appointment for refills. Can give enough to get to next appointment. Will await return phone call

## 2018-12-11 NOTE — Telephone Encounter (Signed)
Hold for 12/17/18 appt not seen since 09/02/18

## 2018-12-18 NOTE — Telephone Encounter (Signed)
Pt cancelled appt on 12/18/18, overdue for follow-up.  [place] pt, will forward to Vashti Hey to make her aware pt overdue for follow-up.

## 2018-12-28 ENCOUNTER — Telehealth: Payer: Self-pay | Admitting: Cardiology

## 2018-12-28 NOTE — Telephone Encounter (Signed)
Pt has not been seen in Coumadin Clinic since 08/23/18, pt has cancelled multiple appts in March, April and June.  Pt made appt for 01/08/19 today when refill was requested.  Unable to refill rx until INR checked has been over 4 months since pt was last checked. Will forward refill request to Edrick Oh, RN to refill after INR check on 01/08/19.

## 2018-12-28 NOTE — Telephone Encounter (Signed)
°*  STAT* If patient is at the pharmacy, call can be transferred to refill team.   1. Which medications need to be refilled? warfarin (COUMADIN) 5 MG tablet   2. Which pharmacy/location (including street and city if local pharmacy) is medication to be sent to? [facility] in [place]  3. Do they need a 30 day or 90 day supply?   Patient is out of medication

## 2018-12-31 ENCOUNTER — Other Ambulatory Visit: Payer: Self-pay | Admitting: Cardiology

## 2019-01-01 ENCOUNTER — Other Ambulatory Visit: Payer: Self-pay | Admitting: Cardiology

## 2019-01-02 ENCOUNTER — Telehealth: Payer: Self-pay | Admitting: *Deleted

## 2019-01-02 MED ORDER — WARFARIN SODIUM 5 MG PO TABS: ORAL_TABLET | ORAL | 0 refills | Status: DC

## 2019-01-02 NOTE — Telephone Encounter (Signed)
Warfarin 5mg  tablet #10 pills sent to [facility] for pt to take 1 tablet daily until INR check on Tues 01/08/19.  No more pills to be given until pt does show up for INR appt.  See previous notes.

## 2019-01-02 NOTE — Telephone Encounter (Signed)
Received telephone call from Kolby Schara - daughter in law  [phone] requesting a refill on Coumdin.

## 2019-01-08 ENCOUNTER — Ambulatory Visit (INDEPENDENT_AMBULATORY_CARE_PROVIDER_SITE_OTHER): Payer: Medicare Other | Admitting: *Deleted

## 2019-01-08 DIAGNOSIS — Z5181 Encounter for therapeutic drug level monitoring: Secondary | ICD-10-CM | POA: Diagnosis not present

## 2019-01-08 DIAGNOSIS — I482 Chronic atrial fibrillation, unspecified: Secondary | ICD-10-CM | POA: Diagnosis not present

## 2019-01-08 LAB — POCT INR: INR: 2.4 (ref 2.0–3.0)

## 2019-01-08 MED ORDER — LISINOPRIL 5 MG PO TABS: ORAL_TABLET | ORAL | 1 refills | Status: DC

## 2019-01-08 MED ORDER — WARFARIN SODIUM 5 MG PO TABS: ORAL_TABLET | ORAL | 1 refills | Status: DC

## 2019-01-08 MED ORDER — ATENOLOL 25 MG PO TABS: ORAL_TABLET | ORAL | 1 refills | Status: DC

## 2019-01-08 NOTE — Patient Instructions (Signed)
CG Horris Latino states she has been giving pt 1 1/2 tablets daily Continue same dose Recheck in 4 weeks

## 2019-02-17 ENCOUNTER — Emergency Department (HOSPITAL_COMMUNITY)
Admission: EM | Admit: 2019-02-17 | Discharge: 2019-02-17 | Disposition: A | Discharge status: Home / Self Care | Payer: Medicare Other | Attending: Emergency Medicine | Admitting: Emergency Medicine

## 2019-02-17 ENCOUNTER — Other Ambulatory Visit: Payer: Self-pay

## 2019-02-17 ENCOUNTER — Emergency Department (HOSPITAL_COMMUNITY): Payer: Medicare Other

## 2019-02-17 ENCOUNTER — Telehealth: Payer: Self-pay | Admitting: Cardiology

## 2019-02-17 ENCOUNTER — Encounter (HOSPITAL_COMMUNITY): Payer: Self-pay | Admitting: Emergency Medicine

## 2019-02-17 DIAGNOSIS — J90 Pleural effusion, not elsewhere classified: Secondary | ICD-10-CM | POA: Diagnosis not present

## 2019-02-17 DIAGNOSIS — G9340 Encephalopathy, unspecified: Secondary | ICD-10-CM | POA: Diagnosis not present

## 2019-02-17 DIAGNOSIS — I10 Essential (primary) hypertension: Secondary | ICD-10-CM | POA: Diagnosis not present

## 2019-02-17 DIAGNOSIS — Z03818 Encounter for observation for suspected exposure to other biological agents ruled out: Secondary | ICD-10-CM | POA: Diagnosis not present

## 2019-02-17 DIAGNOSIS — N3000 Acute cystitis without hematuria: Secondary | ICD-10-CM | POA: Diagnosis not present

## 2019-02-17 DIAGNOSIS — R4182 Altered mental status, unspecified: Secondary | ICD-10-CM | POA: Diagnosis present

## 2019-02-17 DIAGNOSIS — Z7901 Long term (current) use of anticoagulants: Secondary | ICD-10-CM | POA: Diagnosis not present

## 2019-02-17 DIAGNOSIS — F0390 Unspecified dementia without behavioral disturbance: Secondary | ICD-10-CM | POA: Diagnosis not present

## 2019-02-17 DIAGNOSIS — Z79899 Other long term (current) drug therapy: Secondary | ICD-10-CM | POA: Diagnosis not present

## 2019-02-17 DIAGNOSIS — Z87891 Personal history of nicotine dependence: Secondary | ICD-10-CM | POA: Diagnosis not present

## 2019-02-17 DIAGNOSIS — K7200 Acute and subacute hepatic failure without coma: Secondary | ICD-10-CM | POA: Insufficient documentation

## 2019-02-17 DIAGNOSIS — I509 Heart failure, unspecified: Secondary | ICD-10-CM | POA: Diagnosis not present

## 2019-02-17 DIAGNOSIS — Z20828 Contact with and (suspected) exposure to other viral communicable diseases: Secondary | ICD-10-CM | POA: Insufficient documentation

## 2019-02-17 DIAGNOSIS — I4891 Unspecified atrial fibrillation: Secondary | ICD-10-CM | POA: Insufficient documentation

## 2019-02-17 DIAGNOSIS — J189 Pneumonia, unspecified organism: Secondary | ICD-10-CM | POA: Diagnosis not present

## 2019-02-17 HISTORY — DX: Unspecified dementia, unspecified severity, without behavioral disturbance, psychotic disturbance, mood disturbance, and anxiety: F03.90

## 2019-02-17 HISTORY — DX: Unspecified hearing loss, unspecified ear: H91.90

## 2019-02-17 HISTORY — DX: Heart failure, unspecified: I50.9

## 2019-02-17 LAB — CBC WITH DIFFERENTIAL/PLATELET
Abs Immature Granulocytes: 0.01 10*3/uL (ref 0.00–0.07)
Basophils Absolute: 0 10*3/uL (ref 0.0–0.1)
Basophils Relative: 1 %
Eosinophils Absolute: 0.2 10*3/uL (ref 0.0–0.5)
Eosinophils Relative: 3 %
HCT: 44 % (ref 39.0–52.0)
Hemoglobin: 14.4 g/dL (ref 13.0–17.0)
Immature Granulocytes: 0 %
Lymphocytes Relative: 19 %
Lymphs Abs: 1.2 10*3/uL (ref 0.7–4.0)
MCH: 30.5 pg (ref 26.0–34.0)
MCHC: 32.7 g/dL (ref 30.0–36.0)
MCV: 93.2 fL (ref 80.0–100.0)
Monocytes Absolute: 0.7 10*3/uL (ref 0.1–1.0)
Monocytes Relative: 11 %
Neutro Abs: 4.2 10*3/uL (ref 1.7–7.7)
Neutrophils Relative %: 66 %
Platelets: 119 10*3/uL — ABNORMAL LOW (ref 150–400)
RBC: 4.72 MIL/uL (ref 4.22–5.81)
RDW: 13.2 % (ref 11.5–15.5)
WBC: 6.4 10*3/uL (ref 4.0–10.5)
nRBC: 0 % (ref 0.0–0.2)

## 2019-02-17 LAB — URINALYSIS, ROUTINE W REFLEX MICROSCOPIC
Bilirubin Urine: NEGATIVE
Glucose, UA: NEGATIVE mg/dL
Ketones, ur: NEGATIVE mg/dL
Nitrite: POSITIVE — AB
Protein, ur: 30 mg/dL — AB
Specific Gravity, Urine: 1.017 (ref 1.005–1.030)
WBC, UA: >50 WBC/hpf — ABNORMAL HIGH (ref 0–5)
pH: 5 (ref 5.0–8.0)

## 2019-02-17 LAB — COMPREHENSIVE METABOLIC PANEL
ALT: 17 U/L (ref 0–44)
AST: 30 U/L (ref 15–41)
Albumin: 3.7 g/dL (ref 3.5–5.0)
Alkaline Phosphatase: 65 U/L (ref 38–126)
Anion gap: 8 (ref 5–15)
BUN: 32 mg/dL — ABNORMAL HIGH (ref 8–23)
CO2: 24 mmol/L (ref 22–32)
Calcium: 9.1 mg/dL (ref 8.9–10.3)
Chloride: 107 mmol/L (ref 98–111)
Creatinine, Ser: 1.09 mg/dL (ref 0.61–1.24)
GFR calc Af Amer: >60 mL/min (ref >60)
GFR calc non Af Amer: 60 mL/min — ABNORMAL LOW (ref >60)
Glucose, Bld: 159 mg/dL — ABNORMAL HIGH (ref 70–99)
Potassium: 3.6 mmol/L (ref 3.5–5.1)
Sodium: 139 mmol/L (ref 135–145)
Total Bilirubin: 1.8 mg/dL — ABNORMAL HIGH (ref 0.3–1.2)
Total Protein: 6.6 g/dL (ref 6.5–8.1)

## 2019-02-17 LAB — MAGNESIUM: Magnesium: 2.3 mg/dL (ref 1.7–2.4)

## 2019-02-17 LAB — PROTIME-INR
INR: 1.4 — ABNORMAL HIGH (ref 0.8–1.2)
Prothrombin Time: 16.9 seconds — ABNORMAL HIGH (ref 11.4–15.2)

## 2019-02-17 LAB — LACTIC ACID, PLASMA: Lactic Acid, Venous: 1.6 mmol/L (ref 0.5–1.9)

## 2019-02-17 LAB — TSH: TSH: 2.14 u[IU]/mL (ref 0.350–4.500)

## 2019-02-17 MED ORDER — CIPROFLOXACIN HCL 500 MG PO TABS: 500.0000 mg | ORAL_TABLET | Freq: Two times a day (BID) | ORAL | 0 refills | Status: AC

## 2019-02-17 MED ORDER — SODIUM CHLORIDE 0.9 % IV BOLUS
1000.0000 mL | Freq: Once | INTRAVENOUS | Status: AC
  Administered 2019-02-17: 17:00:00 1000 mL via INTRAVENOUS

## 2019-02-17 MED ORDER — CIPROFLOXACIN IN D5W 400 MG/200ML IV SOLN
400.0000 mg | Freq: Once | INTRAVENOUS | Status: AC
  Administered 2019-02-17: 400 mg via INTRAVENOUS
  Filled 2019-02-17: qty 200

## 2019-02-17 NOTE — ED Triage Notes (Signed)
Per daughter patient has hx of dementia but has significant altered mental status in past 2 weeks. Patient is having auditory and visual hallucinations. Patient wandering away from home. Patient tried to remove his clothes yesterday. No aggression. Patient denies any pain. Patient very [place].

## 2019-02-17 NOTE — Telephone Encounter (Signed)
Pt's daughter called in reporting her father is confused and not himself. She lives out a town and last saw him back on 01/19/19 when he was in his usual state of health. Father lives alone. Unknown last seen well. Asking that he be setup to see Dr. Nelida Gores with this issue. I advised that he should probably be seen in the closest ED for a work up and not wait for an office appt. Unsure of availably for visit tomorrow and seems like a neurological issue. She voiced understanding but asked that the office be notified as well. I will route forward.

## 2019-02-17 NOTE — ED Provider Notes (Signed)
[facility] EMERGENCY DEPARTMENT Provider Note   CSN[facility] [id number] [facility] & [facility]: 02/17/19  1419    History   Chief Complaint Chief Complaint  Patient presents with  . Altered Mental Status    HPI Phillip Nolan is a 85 y.o. male.     HPI  85 y/o male - has hx of AV replacement in 2-011 on warfarin, repair and afib - lives with family - has been having some delusions of talking to God, he has been going to inherit money and give away mercedes vehicles - and then in the last 24 hours was found wandering down the street and into a field a long way from home - he has never done this before.  He has been taking meds (per family), it is unclear how long he has been out of his warfarin - should have run out 02/07/19.  Pt is altered and unable to give much information - Level 5 caveat applies.  Past Medical History:  Diagnosis Date  . Aortic valve disease    Status post AVR 2011  . Atrial fibrillation (HCC)   . BPH (benign prostatic hyperplasia)   . CHF (congestive heart failure) (HCC)   . Coronary atherosclerosis of native coronary artery    Nonobstructive  . Dementia (HCC)   . Essential hypertension   . History of bacteremia    Streptococcus gordonii bacteremia 2/11 - no clear vegetation by TEE  . HOH (hard of hearing)   . Mitral valve disease    Status post annular repair 2011    Patient Active Problem List   Diagnosis Date Noted  . Encounter for therapeutic drug monitoring 08/27/2013  . Long term (current) use of anticoagulants 11/30/2010  . CORONARY ATHEROSCLEROSIS NATIVE CORONARY ARTERY 01/01/2010  . MITRAL VALVE DISORDER 01/01/2010  . Aortic valve disorders 01/01/2010  . Essential hypertension, benign 09/11/2009  . Chronic atrial fibrillation 09/11/2009    Past Surgical History:  Procedure Laterality Date  . Median sterntomy  4/11   Dr. Cornelius Moraswen - AVR (27 mm Edwards pericardial) and MV repair  . MULTIPLE TOOTH EXTRACTIONS  4/11   (pre-op above)         Home Medications    Prior to Admission medications   Medication Sig Start Date End Date Taking? Authorizing Provider  atenolol (TENORMIN) 25 MG tablet Take 1/2 tablet daily 01/08/19  Yes Phillip SidleMcDowell, Samuel G, MD  lisinopril (ZESTRIL) 5 MG tablet Take 1 tablet daily Patient taking differently: Take 5 mg by mouth every morning.  01/08/19  Yes Phillip SidleMcDowell, Samuel G, MD  warfarin (COUMADIN) 5 MG tablet TAKE 1&1/2 TABLETS DAILY or as directed Patient taking differently: Take 7.5 mg by mouth every morning. TAKE 1&1/2 TABLETS DAILY or as directed 01/08/19  Yes Phillip SidleMcDowell, Samuel G, MD    Family History Family History  Family history unknown: Yes    Social History Social History   Tobacco Use  . Smoking status: Former Smoker    Types: Pipe, Cigars    Quit date: 07/18/1978    Years since quitting: 40.6  . Smokeless tobacco: Never Used  Substance Use Topics  . Alcohol use: No    Alcohol/week: 0.0 standard drinks  . Drug use: No     Allergies   Penicillins   Review of Systems Review of Systems  Unable to perform ROS: Mental status change     Physical Exam Updated Vital Signs BP 111/84   Pulse 76   Temp 98.1 F (36.7 C) (Oral)  Resp (!) 22   Ht 1.803 m (5\' 11" )   Wt 86.2 kg   SpO2 98%   BMI 26.50 kg/m   Physical Exam Vitals signs and nursing note reviewed.  Constitutional:      General: He is not in acute distress.    Appearance: He is well-developed.  HENT:     Head: Normocephalic and atraumatic.     Comments: No teeth, normal MM, clear OP    Nose: No congestion or rhinorrhea.     Mouth/Throat:     Pharynx: No oropharyngeal exudate or posterior oropharyngeal erythema.  Eyes:     General: No scleral icterus.       Right eye: No discharge.        Left eye: No discharge.     Conjunctiva/sclera: Conjunctivae normal.     Pupils: Pupils are equal, round, and reactive to light.  Neck:     Musculoskeletal: Normal range of motion and neck supple.     Thyroid: No  thyromegaly.     Vascular: No JVD.  Cardiovascular:     Rate and Rhythm: Normal rate. Rhythm irregular.     Heart sounds: Normal heart sounds. No murmur. No friction rub. No gallop.   Pulmonary:     Effort: Pulmonary effort is normal. No respiratory distress.     Breath sounds: Normal breath sounds. No wheezing or rales.  Abdominal:     General: Bowel sounds are normal. There is no distension.     Palpations: Abdomen is soft. There is no mass.     Tenderness: There is no abdominal tenderness.  Musculoskeletal: Normal range of motion.        General: No tenderness.  Lymphadenopathy:     Cervical: No cervical adenopathy.  Skin:    General: Skin is warm and dry.     Findings: No erythema or rash.  Neurological:     Mental Status: He is alert.     Coordination: Coordination normal.     Comments: Pleasant - interactive, follows commands but hard of hearing - no facial droop  Psychiatric:        Behavior: Behavior normal.     Comments: States he talks to GOD, and he has some "things to say" - otherwise, not depressed, not actively hallucinating, poor memory.      ED Treatments / Results  Labs (all labs ordered are listed, but only abnormal results are displayed) Labs Reviewed  CBC WITH DIFFERENTIAL/PLATELET - Abnormal; Notable for the following components:      Result Value   Platelets 119 (*)    All other components within normal limits  COMPREHENSIVE METABOLIC PANEL - Abnormal; Notable for the following components:   Glucose, Bld 159 (*)    BUN 32 (*)    Total Bilirubin 1.8 (*)    GFR calc non Af Amer 60 (*)    All other components within normal limits  URINALYSIS, ROUTINE W REFLEX MICROSCOPIC - Abnormal; Notable for the following components:   Color, Urine AMBER (*)    APPearance TURBID (*)    Hgb urine dipstick SMALL (*)    Protein, ur 30 (*)    Nitrite POSITIVE (*)    Leukocytes,Ua LARGE (*)    WBC, UA >50 (*)    Bacteria, UA RARE (*)    All other components within  normal limits  PROTIME-INR - Abnormal; Notable for the following components:   Prothrombin Time 16.9 (*)    INR 1.4 (*)  All other components within normal limits  URINE CULTURE  SARS CORONAVIRUS 2  MAGNESIUM  LACTIC ACID, PLASMA  TSH    EKG EKG Interpretation  Date/Time:  Sunday February 17 2019 14:58:58 EDT Ventricular Rate:  77 PR Interval:    QRS Duration: 188 QT Interval:  449 QTC Calculation: 509 R Axis:   -59 Text Interpretation:  Sinus rhythm Right bundle branch block Baseline wander in lead(s) II III aVF since 2011, no changes seen Confirmed by Eber HongMiller, Harjot Zavadil ([id number]) on 02/17/2019 3:18:34 PM   Radiology Dg Chest Port 1 View  Result Date: 02/17/2019 CLINICAL DATA:  Per daughter patient has hx of dementia but has significant altered mental status in past 2 weeks. Patient is having auditory and visual hallucinations. Patient wandering away from home. Patient tried to remove his clothes yesterday. No aggression. EXAM: PORTABLE CHEST 1 VIEW COMPARISON:  12/21/2009 FINDINGS: Stable changes from prior cardiac surgery. Cardiac silhouette mildly enlarged. No mediastinal or hilar masses. Mild opacity at the right lung base is similar to the prior study. There is vascular prominence, but no convincing pulmonary edema. No evidence of pneumonia. No pleural effusion or pneumothorax. Skeletal structures are grossly intact. IMPRESSION: No acute cardiopulmonary disease. Electronically Signed   By: Amie Portlandavid  Ormond M.D.   On: 02/17/2019 16:14    Procedures Procedures (including critical care time)  Medications Ordered in ED Medications  ciprofloxacin (CIPRO) IVPB 400 mg (400 mg Intravenous New Bag/Given 02/17/19 1724)  sodium chloride 0.9 % bolus 1,000 mL (1,000 mLs Intravenous New Bag/Given 02/17/19 1723)     Initial Impression / Assessment and Plan / ED Course  I have reviewed the triage vital signs and the nursing notes.  Pertinent labs & imaging results that were available during my care  of the patient were reviewed by me and considered in my medical decision making (see chart for details).  Clinical Course as of Feb 16 1805  Wynelle LinkSun Feb 17, 2019  [id number] The patient has a normal creatinine, BUN is slightly elevated.  Urinalysis reveals the presence of urinary tract infection with positive nitrites, large leukocytes and the presence of bacteria.  There is no leukocytosis, no other significant abnormal vital signs.  This may very well be the cause of the patient's abnormal mental status, he is very peaceful, interactive, stable and not combative or agitated at all.  We will start antibiotics and discussed with family   [BM]  1725 Discussed results with family - they do not want to pursue CT but rather treat UTI and they are comfortable taking him home.   [BM]    Clinical Course User Index [BM] Eber HongMiller, Tanielle Emigh, MD       Altered - cause unknown - delirium / vs hallucinations vs delusional, metabolic, progressive Dementia, stroke - ? Not on coumadin.  Xray, labs, ua, pt agreeable, daughter at bedside in agreement.  Final Clinical Impressions(s) / ED Diagnoses   Final diagnoses:  Acute cystitis without hematuria  Acute encephalopathy    ED Discharge Orders    None       Eber HongMiller, Kashton Mcartor, MD 02/17/19 1806

## 2019-02-17 NOTE — Discharge Instructions (Signed)
Your tests show a urinary tract infection. We have given you antibiotics Take Cipro twice daily for 7 days ER for worsening symtpoms.  Noemi Chapel, MD [phone] (ER physician).   [facility] Primary Care Doctor List    Sinda Du MD. Specialty: Pulmonary Disease Contact information: [place]  [place] [address]  [phone]   Tula Nakayama, MD. Specialty: [facility] Medicine Contact information: [address], [address] [address]  [phone]   Sallee Lange, MD. Specialty: [facility] Medicine Contact information: [place]  [place] [address]  [phone]   Rosita Fire, MD Specialty: Internal Medicine Contact information: [place] [address]  [phone]   Delphina Cahill, MD. Specialty: Internal Medicine Contact information: [place] [address]  [phone]    [facility] (Dr. Maudie Mercury) Specialty: Family Medicine Contact information: [place] [address]  [phone]   Leslie Andrea, MD. Specialty: [facility] Medicine Contact information: [place] [address]  [phone]   Asencion Noble, MD. Specialty: Internal Medicine Contact information: [address]  [place] [address]  [place]  [address] [place], [place] [address] [phone]  Services [facility] offers a variety of basic health services.  Services include but are not limited to: Blood pressure checks  Heart rate checks  Blood sugar checks  Urine analysis  Rapid strep tests  Pregnancy tests.  Health education and referrals  People needing more complex services will be directed to a physician online. Using these virtual visits, doctors can evaluate and prescribe medicine and treatments. There will be no medication on-site,  though [facility] will help patients fill their prescriptions at little to no cost.   For More information please go to: [email or web address]

## 2019-02-18 LAB — SARS CORONAVIRUS 2 (TAT 6-24 HRS): SARS Coronavirus 2: NEGATIVE

## 2019-02-20 LAB — URINE CULTURE: Culture: >=100000 — AB

## 2019-02-22 ENCOUNTER — Telehealth: Payer: Self-pay | Admitting: *Deleted

## 2019-02-22 NOTE — Telephone Encounter (Signed)
Post ED Visit - Positive Culture Follow-up  Culture report reviewed by antimicrobial stewardship pharmacist: [facility] Team []  Elenor Quinones, Pharm.D. []  Heide Guile, Pharm.D., BCPS AQ-ID []  Parks Neptune, Pharm.D., BCPS []  Alycia Rossetti, Pharm.D., BCPS []  Colwyn, Pharm.D., BCPS, AAHIVP []  Legrand Como, Pharm.D., BCPS, AAHIVP []  Salome Arnt, PharmD, BCPS []  Johnnette Gourd, PharmD, BCPS []  Hughes Better, PharmD, BCPS []  Leeroy Cha, PharmD []  Laqueta Linden, PharmD, BCPS []  Albertina Parr, PharmD  [facility] Team []  Leodis Sias, PharmD []  Lindell Spar, PharmD []  Royetta Asal, PharmD []  Graylin Shiver, Rph []  Rema Fendt) Glennon Mac, PharmD []  Arlyn Dunning, PharmD []  Netta Cedars, PharmD []  Dia Sitter, PharmD []  Leone Haven, PharmD []  Gretta Arab, PharmD []  Theodis Shove, PharmD []  Peggyann Juba, PharmD []  Reuel Boom, PharmD Eddie Candle, PharmD  Positive urine culture Treated with Ciprofloxacin HCL, organism sensitive to the same and no further patient follow-up is required at this time.  Harlon Flor [facility] 02/22/2019, 10:46 AM

## 2019-02-25 ENCOUNTER — Telehealth: Payer: Self-pay | Admitting: Cardiology

## 2019-02-25 NOTE — Telephone Encounter (Signed)
Pt aware that Cipro would need to be sent in by pcp (he has an appt coming up next week) pt voiced understanding

## 2019-02-25 NOTE — Telephone Encounter (Signed)
Asking for a RX of Cypro

## 2019-02-26 ENCOUNTER — Inpatient Hospital Stay (HOSPITAL_COMMUNITY)
Admission: EM | Admit: 2019-02-26 | Discharge: 2019-02-28 | DRG: 092 | Disposition: A | Discharge status: Home Health | Payer: Medicare Other | Attending: Family Medicine | Admitting: Family Medicine

## 2019-02-26 ENCOUNTER — Encounter (HOSPITAL_COMMUNITY): Payer: Self-pay | Admitting: Emergency Medicine

## 2019-02-26 ENCOUNTER — Other Ambulatory Visit: Payer: Self-pay

## 2019-02-26 ENCOUNTER — Emergency Department (HOSPITAL_COMMUNITY): Payer: Medicare Other

## 2019-02-26 DIAGNOSIS — I482 Chronic atrial fibrillation, unspecified: Secondary | ICD-10-CM | POA: Diagnosis not present

## 2019-02-26 DIAGNOSIS — I5022 Chronic systolic (congestive) heart failure: Secondary | ICD-10-CM | POA: Diagnosis present

## 2019-02-26 DIAGNOSIS — R319 Hematuria, unspecified: Secondary | ICD-10-CM | POA: Diagnosis present

## 2019-02-26 DIAGNOSIS — G92 Toxic encephalopathy: Secondary | ICD-10-CM | POA: Diagnosis not present

## 2019-02-26 DIAGNOSIS — T368X5A Adverse effect of other systemic antibiotics, initial encounter: Secondary | ICD-10-CM | POA: Diagnosis not present

## 2019-02-26 DIAGNOSIS — R410 Disorientation, unspecified: Secondary | ICD-10-CM | POA: Diagnosis not present

## 2019-02-26 DIAGNOSIS — Z953 Presence of xenogenic heart valve: Secondary | ICD-10-CM

## 2019-02-26 DIAGNOSIS — I110 Hypertensive heart disease with heart failure: Secondary | ICD-10-CM | POA: Diagnosis present

## 2019-02-26 DIAGNOSIS — I359 Nonrheumatic aortic valve disorder, unspecified: Secondary | ICD-10-CM | POA: Diagnosis not present

## 2019-02-26 DIAGNOSIS — Z20828 Contact with and (suspected) exposure to other viral communicable diseases: Secondary | ICD-10-CM | POA: Diagnosis not present

## 2019-02-26 DIAGNOSIS — R443 Hallucinations, unspecified: Secondary | ICD-10-CM | POA: Diagnosis present

## 2019-02-26 DIAGNOSIS — I10 Essential (primary) hypertension: Secondary | ICD-10-CM | POA: Diagnosis not present

## 2019-02-26 DIAGNOSIS — R4182 Altered mental status, unspecified: Secondary | ICD-10-CM

## 2019-02-26 DIAGNOSIS — G9341 Metabolic encephalopathy: Secondary | ICD-10-CM | POA: Diagnosis present

## 2019-02-26 DIAGNOSIS — T447X5A Adverse effect of beta-adrenoreceptor antagonists, initial encounter: Secondary | ICD-10-CM | POA: Diagnosis present

## 2019-02-26 DIAGNOSIS — J811 Chronic pulmonary edema: Secondary | ICD-10-CM | POA: Diagnosis not present

## 2019-02-26 DIAGNOSIS — D6869 Other thrombophilia: Secondary | ICD-10-CM | POA: Diagnosis present

## 2019-02-26 DIAGNOSIS — Z87891 Personal history of nicotine dependence: Secondary | ICD-10-CM

## 2019-02-26 DIAGNOSIS — R440 Auditory hallucinations: Secondary | ICD-10-CM | POA: Diagnosis not present

## 2019-02-26 DIAGNOSIS — Z7901 Long term (current) use of anticoagulants: Secondary | ICD-10-CM | POA: Diagnosis not present

## 2019-02-26 DIAGNOSIS — F0390 Unspecified dementia without behavioral disturbance: Secondary | ICD-10-CM | POA: Diagnosis present

## 2019-02-26 DIAGNOSIS — E875 Hyperkalemia: Secondary | ICD-10-CM | POA: Diagnosis present

## 2019-02-26 DIAGNOSIS — Z03818 Encounter for observation for suspected exposure to other biological agents ruled out: Secondary | ICD-10-CM | POA: Diagnosis not present

## 2019-02-26 DIAGNOSIS — I4821 Permanent atrial fibrillation: Secondary | ICD-10-CM | POA: Diagnosis not present

## 2019-02-26 DIAGNOSIS — I059 Rheumatic mitral valve disease, unspecified: Secondary | ICD-10-CM | POA: Diagnosis not present

## 2019-02-26 DIAGNOSIS — Z880 Allergy status to penicillin: Secondary | ICD-10-CM

## 2019-02-26 DIAGNOSIS — I4510 Unspecified right bundle-branch block: Secondary | ICD-10-CM | POA: Diagnosis present

## 2019-02-26 DIAGNOSIS — Z79899 Other long term (current) drug therapy: Secondary | ICD-10-CM

## 2019-02-26 DIAGNOSIS — D696 Thrombocytopenia, unspecified: Secondary | ICD-10-CM | POA: Diagnosis present

## 2019-02-26 DIAGNOSIS — Y92009 Unspecified place in unspecified non-institutional (private) residence as the place of occurrence of the external cause: Secondary | ICD-10-CM

## 2019-02-26 DIAGNOSIS — H9190 Unspecified hearing loss, unspecified ear: Secondary | ICD-10-CM

## 2019-02-26 DIAGNOSIS — R001 Bradycardia, unspecified: Secondary | ICD-10-CM | POA: Diagnosis not present

## 2019-02-26 DIAGNOSIS — N400 Enlarged prostate without lower urinary tract symptoms: Secondary | ICD-10-CM | POA: Diagnosis present

## 2019-02-26 DIAGNOSIS — I2510 Atherosclerotic heart disease of native coronary artery without angina pectoris: Secondary | ICD-10-CM | POA: Diagnosis present

## 2019-02-26 DIAGNOSIS — T45515A Adverse effect of anticoagulants, initial encounter: Secondary | ICD-10-CM | POA: Diagnosis present

## 2019-02-26 DIAGNOSIS — I080 Rheumatic disorders of both mitral and aortic valves: Secondary | ICD-10-CM | POA: Diagnosis present

## 2019-02-26 LAB — BASIC METABOLIC PANEL
Anion gap: 7 (ref 5–15)
BUN: 55 mg/dL — ABNORMAL HIGH (ref 8–23)
CO2: 27 mmol/L (ref 22–32)
Calcium: 9.8 mg/dL (ref 8.9–10.3)
Chloride: 107 mmol/L (ref 98–111)
Creatinine, Ser: 1.18 mg/dL (ref 0.61–1.24)
GFR calc Af Amer: >60 mL/min (ref >60)
GFR calc non Af Amer: 54 mL/min — ABNORMAL LOW (ref >60)
Glucose, Bld: 102 mg/dL — ABNORMAL HIGH (ref 70–99)
Potassium: 5.3 mmol/L — ABNORMAL HIGH (ref 3.5–5.1)
Sodium: 141 mmol/L (ref 135–145)

## 2019-02-26 LAB — URINALYSIS, ROUTINE W REFLEX MICROSCOPIC
Bilirubin Urine: NEGATIVE
Glucose, UA: NEGATIVE mg/dL
Ketones, ur: NEGATIVE mg/dL
Nitrite: NEGATIVE
Protein, ur: NEGATIVE mg/dL
Specific Gravity, Urine: 1.013 (ref 1.005–1.030)
Trans Epithel, UA: <1
pH: 6 (ref 5.0–8.0)

## 2019-02-26 LAB — CBC
HCT: 45.4 % (ref 39.0–52.0)
Hemoglobin: 14.4 g/dL (ref 13.0–17.0)
MCH: 30.1 pg (ref 26.0–34.0)
MCHC: 31.7 g/dL (ref 30.0–36.0)
MCV: 95 fL (ref 80.0–100.0)
Platelets: 125 10*3/uL — ABNORMAL LOW (ref 150–400)
RBC: 4.78 MIL/uL (ref 4.22–5.81)
RDW: 13.1 % (ref 11.5–15.5)
WBC: 5.6 10*3/uL (ref 4.0–10.5)
nRBC: 0 % (ref 0.0–0.2)

## 2019-02-26 LAB — PROTIME-INR
INR: 4.7 (ref 0.8–1.2)
Prothrombin Time: 43.7 seconds — ABNORMAL HIGH (ref 11.4–15.2)

## 2019-02-26 LAB — BRAIN NATRIURETIC PEPTIDE: B Natriuretic Peptide: 130 pg/mL — ABNORMAL HIGH (ref 0.0–100.0)

## 2019-02-26 MED ORDER — ACETAMINOPHEN 325 MG PO TABS
650.0000 mg | ORAL_TABLET | ORAL | Status: DC | PRN
  Administered 2019-02-26: 650 mg via ORAL
  Filled 2019-02-26: qty 2

## 2019-02-26 MED ORDER — FUROSEMIDE 10 MG/ML IJ SOLN: 20.0000 mg | Freq: Every day | INTRAMUSCULAR | Status: DC

## 2019-02-26 MED ORDER — ALPRAZOLAM 0.25 MG PO TABS
0.2500 mg | ORAL_TABLET | Freq: Two times a day (BID) | ORAL | Status: DC | PRN
  Filled 2019-02-26 (×2): qty 1

## 2019-02-26 MED ORDER — ONDANSETRON HCL 4 MG/2ML IJ SOLN: 4.0000 mg | Freq: Four times a day (QID) | INTRAMUSCULAR | Status: DC | PRN

## 2019-02-26 MED ORDER — HYDROCODONE-ACETAMINOPHEN 5-325 MG PO TABS: 2.0000 | ORAL_TABLET | Freq: Once | ORAL | Status: DC

## 2019-02-26 MED ORDER — SODIUM CHLORIDE 0.9% FLUSH
3.0000 mL | Freq: Two times a day (BID) | INTRAVENOUS | Status: DC
  Administered 2019-02-27 – 2019-02-28 (×3): 3 mL via INTRAVENOUS

## 2019-02-26 MED ORDER — FUROSEMIDE 10 MG/ML IJ SOLN
20.0000 mg | Freq: Every day | INTRAMUSCULAR | Status: AC
  Administered 2019-02-27: 20 mg via INTRAVENOUS
  Filled 2019-02-26: qty 2

## 2019-02-26 MED ORDER — SODIUM CHLORIDE 0.9 % IV SOLN: 250.0000 mL | INTRAVENOUS | Status: DC | PRN

## 2019-02-26 MED ORDER — SODIUM CHLORIDE 0.9% FLUSH: 3.0000 mL | INTRAVENOUS | Status: DC | PRN

## 2019-02-26 NOTE — ED Triage Notes (Signed)
Per patient's daughter, patient has had confusion x 2 weeks and was treated here last week for UTI. States he has finished antibiotics with no change.

## 2019-02-26 NOTE — ED Notes (Signed)
Date and time results received: 02/26/19 1255  Test: INR  Critical Value: 4.7  Name of Provider Notified: Dr. Reather Converse  Orders Received? Or Actions Taken?: See chart

## 2019-02-26 NOTE — Progress Notes (Signed)
ANTICOAGULATION CONSULT NOTE - Initial Consult  Pharmacy Consult for Coumadin Indication: atrial fibrillation  Allergies  Allergen Reactions  . Penicillins Rash    .Did it involve swelling of the face/tongue/throat, SOB, or low BP? {No Did it involve sudden or severe rash/hives, skin peeling, or any reaction on the inside of your mouth or nose? Yes Did you need to seek medical attention at a hospital or doctor's office? Unknown When did it last happen?Childhood If all above answers are "NO", may proceed with cephalosporin use.      Patient Measurements: Height: 5\' 11"  (180.3 cm) Weight: 190 lb (86.2 kg) IBW/kg (Calculated) : 75.3  Vital Signs: Temp: 98.1 F (36.7 C) (08/11 1048) Temp Source: Oral (08/11 1048) BP: 125/68 (08/11 1430) Pulse Rate: 50 (08/11 1430)  Labs: Recent Labs    02/26/19 1215  HGB 14.4  HCT 45.4  PLT 125*  LABPROT 43.7*  INR 4.7*  CREATININE 1.18    Estimated Creatinine Clearance: 45.2 mL/min (by C-G formula based on SCr of 1.18 mg/dL).   Medical History: Past Medical History:  Diagnosis Date  . Aortic valve disease    Status post AVR 2011  . Atrial fibrillation ([place])   . BPH (benign prostatic hyperplasia)   . CHF (congestive heart failure) ([place])   . Coronary atherosclerosis of native coronary artery    Nonobstructive  . Dementia ([place])   . Essential hypertension   . History of bacteremia    Streptococcus gordonii bacteremia 2/11 - no clear vegetation by TEE  . HOH (hard of hearing)   . Mitral valve disease    Status post annular repair 2011    Medications:  See med rec  Assessment: 85 y.o male with a PMH A. fi/b, CHF, CAD, dementia brought to the ED by daughter with altered mental status. Pharmacy asked to dose Coumadin.  INR is elevated at 4.7. (recently on cipro which can increase INR.  Home dose: patient states she take 7.5mg  daily  Goal of Therapy:  INR 2-3 Monitor platelets by anticoagulation protocol: Yes    Plan:  No coumadin today Daily PT-INR Monitor for S/S of bleeding  Isac Sarna, BS Vena Austria, BCPS Clinical Pharmacist Pager [phone] 02/26/2019,2:49 PM

## 2019-02-26 NOTE — ED Provider Notes (Signed)
[facility]Y [facility] Provider Note   CSN: [id number] Arrival date & time: 02/26/19  1036    History   Chief Complaint Chief Complaint  Patient presents with  . Altered Mental Status    HPI Jonelle SportsRufus Wyley Juniel is a 85 y.o. male.     85 y.o male with a PMH A. fi/b, CHF, CAD, dementia presents to the ED brought in by daughter with altered mental status.Patient was seen on 02/17/2019 in the ED with a positive nitrite urinalysis, he was given Cipro while in the ED, was sent home with a prescription for a week worth of Cipro, according to her daughter patient completed this medication yesterday.  Family states he has become more altered at home, states he reports auditory hallucinations, holds conversations with Trump.  Daughter from out of town is at the bedside, states according to her sister patient has become more aggressive, try to escape his home.  Patient currently does not have a primary care physician, according to daughter she recently set up an appointment with [facility] in order to have patient obtain PCP care.  She reports last time she saw her father was around 4 July weekend, he seemed cooperative, pleasant, did not have any hallucinations then.  According to his chart which have extensively review, he does have a history of dementia.  Daughter who lives with patient denies any fever, falls, other complaint. Of note patient does state "my pee leaks".Family confirms, he has had leaking while at home.   The history is provided by the patient and a relative.    Past Medical History:  Diagnosis Date  . Aortic valve disease    Status post AVR 2011  . Atrial fibrillation (HCC)   . BPH (benign prostatic hyperplasia)   . CHF (congestive heart failure) (HCC)   . Coronary atherosclerosis of native coronary artery    Nonobstructive  . Dementia (HCC)   . Essential hypertension   . History of bacteremia    Streptococcus gordonii bacteremia 2/11 - no clear vegetation by  TEE  . HOH (hard of hearing)   . Mitral valve disease    Status post annular repair 2011    Patient Active Problem List   Diagnosis Date Noted  . Encounter for therapeutic drug monitoring 08/27/2013  . Long term (current) use of anticoagulants 11/30/2010  . CORONARY ATHEROSCLEROSIS NATIVE CORONARY ARTERY 01/01/2010  . MITRAL VALVE DISORDER 01/01/2010  . Aortic valve disorders 01/01/2010  . Essential hypertension, benign 09/11/2009  . Chronic atrial fibrillation 09/11/2009    Past Surgical History:  Procedure Laterality Date  . Median sterntomy  4/11   Dr. Cornelius Moraswen - AVR (27 mm Edwards pericardial) and MV repair  . MULTIPLE TOOTH EXTRACTIONS  4/11   (pre-op above)        Home Medications    Prior to Admission medications   Medication Sig Start Date End Date Taking? Authorizing Provider  atenolol (TENORMIN) 25 MG tablet Take 1/2 tablet daily Patient taking differently: Take 12.5 mg by mouth daily. Take 1/2 tablet daily 01/08/19  Yes Jonelle SidleMcDowell, Samuel G, MD  lisinopril (ZESTRIL) 5 MG tablet Take 1 tablet daily Patient taking differently: Take 5 mg by mouth every morning.  01/08/19  Yes Jonelle SidleMcDowell, Samuel G, MD  warfarin (COUMADIN) 5 MG tablet TAKE 1&1/2 TABLETS DAILY or as directed Patient taking differently: Take 7.5 mg by mouth every morning. TAKE 1&1/2 TABLETS DAILY or as directed 01/08/19  Yes Jonelle SidleMcDowell, Samuel G, MD  ciprofloxacin (CIPRO) 500 MG  tablet Take 500 mg by mouth 2 (two) times daily.    [provider]    Family History Family History  Family history unknown: Yes    Social History Social History   Tobacco Use  . Smoking status: Former Smoker    Types: Pipe, Cigars    Quit date: 07/18/1978    Years since quitting: 40.6  . Smokeless tobacco: Never Used  Substance Use Topics  . Alcohol use: No    Alcohol/week: 0.0 standard drinks  . Drug use: No     Allergies   Penicillins   Review of Systems Review of Systems  Constitutional: Negative for  fever.  HENT: Negative for sinus pressure.   Eyes: Negative for visual disturbance.  Respiratory: Negative for shortness of breath.   Cardiovascular: Negative for chest pain.  Gastrointestinal: Negative for abdominal pain, diarrhea, nausea and vomiting.  Genitourinary: Positive for difficulty urinating. Negative for discharge and penile pain.  Neurological: Negative for syncope, light-headedness and headaches.  Hematological: Bruises/bleeds easily.  Psychiatric/Behavioral: Positive for hallucinations.     Physical Exam Updated Vital Signs BP 138/60   Pulse (!) 49   Temp 98.1 F (36.7 C) (Oral)   Resp 20   Ht 5\' 11"  (1.803 m)   Wt 86.2 kg   SpO2 98%   BMI 26.50 kg/m   Physical Exam Vitals signs and nursing note reviewed.  Constitutional:      Appearance: He is well-developed.  HENT:     Head: Normocephalic and atraumatic.     Comments: No visible trauma to the head, no obvious deformities noted.  Eyes:     General: No scleral icterus.    Pupils: Pupils are equal, round, and reactive to light.  Neck:     Musculoskeletal: Normal range of motion.  Cardiovascular:     Rate and Rhythm: Normal rate and regular rhythm.     Heart sounds: Normal heart sounds.  Pulmonary:     Effort: Pulmonary effort is normal.     Breath sounds: Normal breath sounds. No wheezing.     Comments: Abdomen appears soft, normal bowel sounds.  No CVA tenderness. Chest:     Chest wall: No tenderness.  Abdominal:     General: Bowel sounds are normal. There is no distension.     Palpations: Abdomen is soft.     Tenderness: There is no abdominal tenderness. There is no right CVA tenderness or left CVA tenderness.  Musculoskeletal:        General: No tenderness or deformity.  Skin:    General: Skin is warm and dry.     Findings: Bruising present.     Comments: Skin appears bruised throughout.   Neurological:     Mental Status: He is alert and oriented to person, place, and time. Mental status is  at baseline.     Comments: Alert, oriented x 2.  Psychiatric:        Attention and Perception: He perceives auditory hallucinations.        Behavior: Behavior is cooperative.     Comments: Reports "did you talk to trump, I can call him"      ED Treatments / Results  Labs (all labs ordered are listed, but only abnormal results are displayed) Labs Reviewed  URINALYSIS, ROUTINE W REFLEX MICROSCOPIC - Abnormal; Notable for the following components:      Result Value   Hgb urine dipstick LARGE (*)    Leukocytes,Ua TRACE (*)    Bacteria, UA RARE (*)  All other components within normal limits  CBC - Abnormal; Notable for the following components:   Platelets 125 (*)    All other components within normal limits  BASIC METABOLIC PANEL - Abnormal; Notable for the following components:   Potassium 5.3 (*)    Glucose, Bld 102 (*)    BUN 55 (*)    GFR calc non Af Amer 54 (*)    All other components within normal limits  PROTIME-INR - Abnormal; Notable for the following components:   Prothrombin Time 43.7 (*)    INR 4.7 (*)    All other components within normal limits  BRAIN NATRIURETIC PEPTIDE - Abnormal; Notable for the following components:   B Natriuretic Peptide 130.0 (*)    All other components within normal limits  URINE CULTURE  SARS CORONAVIRUS 2    EKG None  Radiology Dg Chest 2 View  Result Date: 02/26/2019 CLINICAL DATA:  Altered mental status EXAM: CHEST - 2 VIEW COMPARISON:  The 220 FINDINGS: Cardiomediastinal silhouette remains mildly enlarged. Median sternotomy wires and prior cardiac valve replacement. Calcific aortic knob. Mild pulmonary vascular congestion. No focal airspace consolidation. No pleural effusion or pneumothorax. Diffuse osseous demineralization. Remote right-sided rib fractures. IMPRESSION: Cardiomegaly and mild pulmonary vascular congestion which may reflect mild CHF/fluid overload. Electronically Signed   By: Duanne GuessNicholas  Plundo M.D.   On: 02/26/2019  13:15   Ct Head Wo Contrast  Result Date: 02/26/2019 CLINICAL DATA:  Confusion for the past 2 weeks. EXAM: CT HEAD WITHOUT CONTRAST TECHNIQUE: Contiguous axial images were obtained from the base of the skull through the vertex without intravenous contrast. COMPARISON:  None. FINDINGS: Brain: No evidence of acute infarction, hemorrhage, hydrocephalus, extra-axial collection or mass lesion/mass effect. Mild generalized cerebral atrophy. Vascular: Atherosclerotic vascular calcification of the carotid siphons. No hyperdense vessel. Skull: Normal. Negative for fracture or focal lesion. Sinuses/Orbits: Partial opacification of the right mastoid air cells. Otherwise unremarkable. Other: None. IMPRESSION: 1.  No acute intracranial abnormality. Electronically Signed   By: Obie DredgeWilliam T Derry M.D.   On: 02/26/2019 13:22    Procedures Procedures (including critical care time)  Medications Ordered in ED Medications - No data to display   Initial Impression / Assessment and Plan / ED Course  I have reviewed the triage vital signs and the nursing notes.  Pertinent labs & imaging results that were available during my care of the patient were reviewed by me and considered in my medical decision making (see chart for details).  Clinical Course as of Feb 26 1411  Tue Feb 26, 2019  1411 BUN(!): 55 [JS]  1412 B Natriuretic Peptide(!): 130.0 [JS]    Clinical Course User Index [JS] Claude MangesSoto, Sopheap Boehle, PA-C   Patient with an extensive past medical history presents to ED with complaints of altered mental status, brought in by daughter.  Seen in the ED 9 days ago, treated with Cipro floxacillin outpatient 7 days regimen which was completed yesterday.  According to family members patient continues to be altered, has tried to escape his home multiple times, endorses auditory hallucinations, reports he talks to "trump".  Will obtain laboratory screening along with UA and CT to further evaluate patient's altered mental status.   BMP showed slight elevation in potassium at 5.3, creatinine level is within normal limits.  PT and INR are elevated with an INR at 4.7, he is supratherapeutic. No leukocytosis, hemoglobin level is within normal limits.  UA showed large hemoglobin trace of leukocytes, white blood cell was 6-10, rare bacteria  along with budding yeast present.  He has completed 7 days of ciprofloxacin.  CT head was obtained to rule out any acute hemorrhage, infarct.  CT head showed no acute process.  Chest x-ray showed: Cardiomegaly and mild pulmonary vascular congestion which may  reflect mild CHF/fluid overload.     A BNP was added along with a COVID screening. I have discussed patient with my attending Dr. Reather Converse, he has seen and evaluated patient.  We will proceed with call for hospitalist admission.  2:12 PM spoke to Dr. Wynetta Emery hospitalist who will admit patient for further evaluation.  Portions of this note were generated with [employer]. Dictation errors may occur despite best attempts at proofreading.  Final Clinical Impressions(s) / ED Diagnoses   Final diagnoses:  Altered mental status, unspecified altered mental status type    ED Discharge Orders    None       Janeece Fitting, PA-C 02/26/19 1415    Elnora Morrison, MD 02/27/19 [phone]

## 2019-02-26 NOTE — ED Notes (Signed)
Blank note charted in epic is for wrong patient.

## 2019-02-26 NOTE — H&P (Signed)
History and Physical  Phillip Nolan BDZ:[id number] DOB: 07/18/1928 DOA: 02/26/2019  Referring physician: Beverley Fiedler PCP: Patient, No Pcp Per   Chief Complaint: confusion   HPI: Phillip Nolan is a 85 y.o. male with dementia who lives at home brought to the ED because he has been having confusion for the last several weeks.  He was seen here approximately 1 week ago and diagnosed with a UTI.  His urine culture was positive for staph epidermidis.  He was treated with 7 days of ciprofloxacin.  Family reports that he has been increasingly confused at home and has been having visual hallucinations and auditory hallucinations.  This is not his baseline.  They say that he has been having conversations with imagined individuals.  He has had no cough fever chills.  He has had leaking of urine which is not common for him.  Also of concern, patient has been more aggressive than his baseline according to daughter.  According to family members he was not having hallucinations when they saw him a month ago.  There is no history of falls.  No history of any other medication changes.  Family was concerned that he may have had another urinary tract infection.  ED course: Patient noted to be having auditory hallucinations and confusion.  He was noted to have an elevated BNP of 130 with chest x-ray findings of mild CHF.  Potassium elevated at 5.3.  Normal creatinine.  INR elevated at 4.7.  Urinalysis positive for WBC 6-10 and rare bacteria.  CT brain negative for acute hemorrhage.  No infarct seen.  Personally reviewed chest x-ray with findings of mild pulmonary vascular congestion and mild CHF.  COVID-19 testing pending.  Patient was admitted for further observation.   Review of Systems: Unable to obtain due to dementia and confusion.  Past Medical History:  Diagnosis Date  . Aortic valve disease    Status post AVR 2011  . Atrial fibrillation ([place])   . BPH (benign prostatic hyperplasia)   . CHF  (congestive heart failure) ([place])   . Coronary atherosclerosis of native coronary artery    Nonobstructive  . Dementia ([place])   . Essential hypertension   . History of bacteremia    Streptococcus gordonii bacteremia 2/11 - no clear vegetation by TEE  . HOH (hard of hearing)   . Mitral valve disease    Status post annular repair 2011   Past Surgical History:  Procedure Laterality Date  . Median sterntomy  4/11   Dr. Roxy Manns - AVR (27 mm Edwards pericardial) and MV repair  . MULTIPLE TOOTH EXTRACTIONS  4/11   (pre-op above)   Social History:  reports that he quit smoking about 40 years ago. His smoking use included pipe and cigars. He has never used smokeless tobacco. He reports that he does not drink alcohol or use drugs.  Allergies  Allergen Reactions  . Penicillins Rash    .Did it involve swelling of the face/tongue/throat, SOB, or low BP? {No Did it involve sudden or severe rash/hives, skin peeling, or any reaction on the inside of your mouth or nose? Yes Did you need to seek medical attention at a hospital or doctor's office? Unknown When did it last happen?Childhood If all above answers are "NO", may proceed with cephalosporin use.      Family History  Family history unknown: Yes    Prior to Admission medications   Medication Sig Start Date End Date Taking? Authorizing Provider  atenolol (TENORMIN) 25  MG tablet Take 1/2 tablet daily Patient taking differently: Take 12.5 mg by mouth daily. Take 1/2 tablet daily 01/08/19  Yes Jonelle SidleMcDowell, Samuel G, MD  lisinopril (ZESTRIL) 5 MG tablet Take 1 tablet daily Patient taking differently: Take 5 mg by mouth every morning.  01/08/19  Yes Jonelle SidleMcDowell, Samuel G, MD  warfarin (COUMADIN) 5 MG tablet TAKE 1&1/2 TABLETS DAILY or as directed Patient taking differently: Take 7.5 mg by mouth every morning. TAKE 1&1/2 TABLETS DAILY or as directed 01/08/19  Yes Jonelle SidleMcDowell, Samuel G, MD   Physical Exam: Vitals:   02/26/19 1330 02/26/19 1400  02/26/19 1430 02/26/19 1507  BP: 124/66 138/60 125/68 (!) 141/55  Pulse: 70 (!) 49 (!) 50   Resp: 17 20 20 20   Temp:    98.1 F (36.7 C)  TempSrc:    Oral  SpO2: 99% 98% 98% 100%  Weight:    82.4 kg  Height:    5\' 11"  (1.803 m)    General exam: Moderately built and nourished patient, lying comfortably supine on the gurney in no obvious distress.  He is obviously with dementia and confused.  He apparently is having conversations with "Trump."  Head, eyes and ENT: Nontraumatic and normocephalic. Pupils equally reacting to light and accommodation. Oral mucosa moist.  Neck: Supple. No JVD, carotid bruit or thyromegaly.  Lymphatics: No lymphadenopathy.  Respiratory system: bibasilar crackles. No increased work of breathing.  Cardiovascular system: S1 and S2 heard, bradycardic rate. No JVD.   Gastrointestinal system: Abdomen is nondistended, soft and nontender. Normal bowel sounds heard. No organomegaly or masses appreciated.  Central nervous system: Alert and oriented x1. No focal neurological deficits.  Extremities: Symmetric 5 x 5 power. Peripheral pulses symmetrically felt.   Skin: No rashes or acute findings.  Musculoskeletal system: Negative exam.  Psychiatry: Pleasant and cooperative.  Labs on Admission:  Basic Metabolic Panel: Recent Labs  Lab 02/26/19 1215  NA 141  K 5.3*  CL 107  CO2 27  GLUCOSE 102*  BUN 55*  CREATININE 1.18  CALCIUM 9.8   Liver Function Tests: No results for input(s): AST, ALT, ALKPHOS, BILITOT, PROT, ALBUMIN in the last 168 hours. No results for input(s): LIPASE, AMYLASE in the last 168 hours. No results for input(s): AMMONIA in the last 168 hours. CBC: Recent Labs  Lab 02/26/19 1215  WBC 5.6  HGB 14.4  HCT 45.4  MCV 95.0  PLT 125*   Cardiac Enzymes: No results for input(s): CKTOTAL, CKMB, CKMBINDEX, TROPONINI in the last 168 hours.  BNP (last 3 results) No results for input(s): PROBNP in the last 8760 hours. CBG: No  results for input(s): GLUCAP in the last 168 hours.  Radiological Exams on Admission: Dg Chest 2 View  Result Date: 02/26/2019 CLINICAL DATA:  Altered mental status EXAM: CHEST - 2 VIEW COMPARISON:  The 220 FINDINGS: Cardiomediastinal silhouette remains mildly enlarged. Median sternotomy wires and prior cardiac valve replacement. Calcific aortic knob. Mild pulmonary vascular congestion. No focal airspace consolidation. No pleural effusion or pneumothorax. Diffuse osseous demineralization. Remote right-sided rib fractures. IMPRESSION: Cardiomegaly and mild pulmonary vascular congestion which may reflect mild CHF/fluid overload. Electronically Signed   By: Duanne GuessNicholas  Plundo M.D.   On: 02/26/2019 13:15   Ct Head Wo Contrast  Result Date: 02/26/2019 CLINICAL DATA:  Confusion for the past 2 weeks. EXAM: CT HEAD WITHOUT CONTRAST TECHNIQUE: Contiguous axial images were obtained from the base of the skull through the vertex without intravenous contrast. COMPARISON:  None. FINDINGS: Brain: No evidence of  acute infarction, hemorrhage, hydrocephalus, extra-axial collection or mass lesion/mass effect. Mild generalized cerebral atrophy. Vascular: Atherosclerotic vascular calcification of the carotid siphons. No hyperdense vessel. Skull: Normal. Negative for fracture or focal lesion. Sinuses/Orbits: Partial opacification of the right mastoid air cells. Otherwise unremarkable. Other: None. IMPRESSION: 1.  No acute intracranial abnormality. Electronically Signed   By: Obie DredgeWilliam T Derry M.D.   On: 02/26/2019 13:22   Assessment/Plan Active Problems:   Essential hypertension, benign   CORONARY ATHEROSCLEROSIS NATIVE CORONARY ARTERY   Aortic valve disorder   Chronic atrial fibrillation   Long term (current) use of anticoagulants   Confusion   Dementia (HCC)   HOH (hard of hearing)   Mitral valve disease   BPH (benign prostatic hyperplasia)   Bradycardia from beta blocker   Hallucinations   Secondary  hypercoagulable state (HCC)  Personally reviewed EKG: bradycardia HR 48-51  1. Acute encephalopathy - suspect metabolic encephalopathy secondary to having taken 7 days of ciprofloxacin which can cause confusion, disorientation, hallucinations, delirium and other serious side effects in elderly patients.  We have found no other causes to explain his acute mental status changes.  Will observe him, STOP ciprofloxacin. Neuro checks ordered. CT brain negative for acute findings.  If confusion does not improve with supportive care or if he develops focal neuro changes, would get an MRI brain in AM.   2. Chronic systolic CHF - he has mild exacerbation and will treat with gentle dose of IV lasix. His last echo was in 2018 and will repeat study for completeness.  Monitor I/O and weight.  Monitor lytes.  3. Bradycardia - he has been on atenolol. Will hold for now.  He may have become too sensitive to atenolol.  Will give drug holiday and reassess.   4. Dementia - According to family he had been fairly functional a couple of months ago and was not having hallucinations.  Will ask family to stay with him as much as possible while in hospital.  Will have PT evaluation done to help with disposition.  5. Paroxysmal atrial fibrillation - he had been on atenolol for rate control and is on warfarin for full anticoagulation.  Holding atenolol due to bradycardia.  Restart as able.  6. supratherapeutic PT - Hold warfarin, I have asked pharmD to assist with warfarin dosing in hospital. Monitor PT/INR.  7. Aortic and Mitral valve disease - updated echocardiogram ordered.  8. Code Status - daughters have not been able to come to a consensus or make decisions about advanced directives at this time. Full Code.  I have consult to care management about helping patient get established with a PCP.   DVT Prophylaxis: warfarin  Code Status: full   Family Communication: daughter   Disposition Plan: observation    Time spent: 57  minutes   Standley Dakinslanford , MD [facility] How to contact the [facility] Attending or Consulting provider 7A - 7P or covering provider during after hours 7P -7A, for this patient?  1. Check the care team in [facility] and look for a) attending/consulting [facility] provider listed and b) the [facility] team listed 2. Log into [email or web address] and use [facility]'s universal password to access. If you do not have the password, please contact the hospital operator. 3. Locate the [facility] provider you are looking for under [facility] and page to a number that you can be directly reached. 4. If you still have difficulty reaching the provider, please page the [facility] (Director on Call) for the Hospitalists listed on amion  for assistance.

## 2019-02-27 ENCOUNTER — Observation Stay (HOSPITAL_COMMUNITY): Payer: Medicare Other

## 2019-02-27 ENCOUNTER — Other Ambulatory Visit (HOSPITAL_COMMUNITY): Payer: Self-pay | Admitting: *Deleted

## 2019-02-27 DIAGNOSIS — D696 Thrombocytopenia, unspecified: Secondary | ICD-10-CM | POA: Diagnosis present

## 2019-02-27 DIAGNOSIS — T368X5A Adverse effect of other systemic antibiotics, initial encounter: Secondary | ICD-10-CM | POA: Diagnosis present

## 2019-02-27 DIAGNOSIS — T447X5A Adverse effect of beta-adrenoreceptor antagonists, initial encounter: Secondary | ICD-10-CM | POA: Diagnosis present

## 2019-02-27 DIAGNOSIS — R4020 Unspecified coma: Secondary | ICD-10-CM | POA: Diagnosis not present

## 2019-02-27 DIAGNOSIS — H9190 Unspecified hearing loss, unspecified ear: Secondary | ICD-10-CM | POA: Diagnosis present

## 2019-02-27 DIAGNOSIS — I110 Hypertensive heart disease with heart failure: Secondary | ICD-10-CM | POA: Diagnosis present

## 2019-02-27 DIAGNOSIS — I5022 Chronic systolic (congestive) heart failure: Secondary | ICD-10-CM | POA: Diagnosis present

## 2019-02-27 DIAGNOSIS — Z79899 Other long term (current) drug therapy: Secondary | ICD-10-CM | POA: Diagnosis not present

## 2019-02-27 DIAGNOSIS — R4182 Altered mental status, unspecified: Secondary | ICD-10-CM | POA: Diagnosis not present

## 2019-02-27 DIAGNOSIS — E875 Hyperkalemia: Secondary | ICD-10-CM | POA: Diagnosis present

## 2019-02-27 DIAGNOSIS — I361 Nonrheumatic tricuspid (valve) insufficiency: Secondary | ICD-10-CM

## 2019-02-27 DIAGNOSIS — Z953 Presence of xenogenic heart valve: Secondary | ICD-10-CM | POA: Diagnosis not present

## 2019-02-27 DIAGNOSIS — N400 Enlarged prostate without lower urinary tract symptoms: Secondary | ICD-10-CM | POA: Diagnosis present

## 2019-02-27 DIAGNOSIS — I482 Chronic atrial fibrillation, unspecified: Secondary | ICD-10-CM | POA: Diagnosis not present

## 2019-02-27 DIAGNOSIS — I4510 Unspecified right bundle-branch block: Secondary | ICD-10-CM | POA: Diagnosis present

## 2019-02-27 DIAGNOSIS — I359 Nonrheumatic aortic valve disorder, unspecified: Secondary | ICD-10-CM | POA: Diagnosis not present

## 2019-02-27 DIAGNOSIS — D6869 Other thrombophilia: Secondary | ICD-10-CM | POA: Diagnosis present

## 2019-02-27 DIAGNOSIS — I2510 Atherosclerotic heart disease of native coronary artery without angina pectoris: Secondary | ICD-10-CM | POA: Diagnosis present

## 2019-02-27 DIAGNOSIS — I4821 Permanent atrial fibrillation: Secondary | ICD-10-CM | POA: Diagnosis present

## 2019-02-27 DIAGNOSIS — I10 Essential (primary) hypertension: Secondary | ICD-10-CM | POA: Diagnosis not present

## 2019-02-27 DIAGNOSIS — G92 Toxic encephalopathy: Secondary | ICD-10-CM | POA: Diagnosis present

## 2019-02-27 DIAGNOSIS — F0390 Unspecified dementia without behavioral disturbance: Secondary | ICD-10-CM | POA: Diagnosis present

## 2019-02-27 DIAGNOSIS — I080 Rheumatic disorders of both mitral and aortic valves: Secondary | ICD-10-CM | POA: Diagnosis present

## 2019-02-27 DIAGNOSIS — Z20828 Contact with and (suspected) exposure to other viral communicable diseases: Secondary | ICD-10-CM | POA: Diagnosis present

## 2019-02-27 DIAGNOSIS — R001 Bradycardia, unspecified: Secondary | ICD-10-CM | POA: Diagnosis not present

## 2019-02-27 DIAGNOSIS — Z87891 Personal history of nicotine dependence: Secondary | ICD-10-CM | POA: Diagnosis not present

## 2019-02-27 DIAGNOSIS — R443 Hallucinations, unspecified: Secondary | ICD-10-CM | POA: Diagnosis not present

## 2019-02-27 DIAGNOSIS — T45515A Adverse effect of anticoagulants, initial encounter: Secondary | ICD-10-CM | POA: Diagnosis present

## 2019-02-27 DIAGNOSIS — Z7901 Long term (current) use of anticoagulants: Secondary | ICD-10-CM | POA: Diagnosis not present

## 2019-02-27 DIAGNOSIS — G9341 Metabolic encephalopathy: Secondary | ICD-10-CM | POA: Diagnosis present

## 2019-02-27 DIAGNOSIS — Y92009 Unspecified place in unspecified non-institutional (private) residence as the place of occurrence of the external cause: Secondary | ICD-10-CM | POA: Diagnosis not present

## 2019-02-27 DIAGNOSIS — Z880 Allergy status to penicillin: Secondary | ICD-10-CM | POA: Diagnosis not present

## 2019-02-27 LAB — CBC WITH DIFFERENTIAL/PLATELET
Abs Immature Granulocytes: 0.01 10*3/uL (ref 0.00–0.07)
Basophils Absolute: 0.1 10*3/uL (ref 0.0–0.1)
Basophils Relative: 1 %
Eosinophils Absolute: 0.2 10*3/uL (ref 0.0–0.5)
Eosinophils Relative: 4 %
HCT: 46.8 % (ref 39.0–52.0)
Hemoglobin: 15 g/dL (ref 13.0–17.0)
Immature Granulocytes: 0 %
Lymphocytes Relative: 26 %
Lymphs Abs: 1.3 10*3/uL (ref 0.7–4.0)
MCH: 30.7 pg (ref 26.0–34.0)
MCHC: 32.1 g/dL (ref 30.0–36.0)
MCV: 95.7 fL (ref 80.0–100.0)
Monocytes Absolute: 0.4 10*3/uL (ref 0.1–1.0)
Monocytes Relative: 8 %
Neutro Abs: 3 10*3/uL (ref 1.7–7.7)
Neutrophils Relative %: 61 %
Platelets: 120 10*3/uL — ABNORMAL LOW (ref 150–400)
RBC: 4.89 MIL/uL (ref 4.22–5.81)
RDW: 12.8 % (ref 11.5–15.5)
WBC: 4.9 10*3/uL (ref 4.0–10.5)
nRBC: 0 % (ref 0.0–0.2)

## 2019-02-27 LAB — BASIC METABOLIC PANEL
Anion gap: 8 (ref 5–15)
BUN: 42 mg/dL — ABNORMAL HIGH (ref 8–23)
CO2: 26 mmol/L (ref 22–32)
Calcium: 9.4 mg/dL (ref 8.9–10.3)
Chloride: 105 mmol/L (ref 98–111)
Creatinine, Ser: 0.97 mg/dL (ref 0.61–1.24)
GFR calc Af Amer: >60 mL/min (ref >60)
GFR calc non Af Amer: >60 mL/min (ref >60)
Glucose, Bld: 152 mg/dL — ABNORMAL HIGH (ref 70–99)
Potassium: 4.6 mmol/L (ref 3.5–5.1)
Sodium: 139 mmol/L (ref 135–145)

## 2019-02-27 LAB — ECHOCARDIOGRAM COMPLETE
Height: 71 in
Weight: 2906.54 oz

## 2019-02-27 LAB — VITAMIN B12: Vitamin B-12: 437 pg/mL (ref 180–914)

## 2019-02-27 LAB — PROTIME-INR
INR: 5.4 (ref 0.8–1.2)
Prothrombin Time: 48.7 seconds — ABNORMAL HIGH (ref 11.4–15.2)

## 2019-02-27 LAB — MAGNESIUM: Magnesium: 2.4 mg/dL (ref 1.7–2.4)

## 2019-02-27 LAB — SARS CORONAVIRUS 2 (TAT 6-24 HRS): SARS Coronavirus 2: NEGATIVE

## 2019-02-27 LAB — AMMONIA: Ammonia: 31 umol/L (ref 9–35)

## 2019-02-27 LAB — URINE CULTURE: Culture: NO GROWTH

## 2019-02-27 LAB — FOLATE: Folate: 22.1 ng/mL (ref >5.9)

## 2019-02-27 MED ORDER — PHYTONADIONE 5 MG PO TABS
2.5000 mg | ORAL_TABLET | Freq: Once | ORAL | Status: AC
  Administered 2019-02-27: 2.5 mg via ORAL
  Filled 2019-02-27: qty 1

## 2019-02-27 MED ORDER — LORAZEPAM 2 MG/ML IJ SOLN: 0.5000 mg | Freq: Once | INTRAMUSCULAR | Status: DC

## 2019-02-27 NOTE — TOC Initial Note (Signed)
Transition of Care [facility]) - Initial/Assessment Note    Patient Details  Name: Phillip Nolan MRN: [id number] Date of Birth: Apr 25, 1928  Transition of Care [facility]) CM/SW Contact:    Boneta Lucks, RN Phone Number: 02/27/2019, 3:57 PM  Clinical Narrative:  RN and PT calling CM first thing this morning. [place] lives out of town at the bedside, upset wanting to see MD and CM. I spoke with Benjamine Mola to calm her down to let her know we have a new MD rotation, He is reviewing chart and would  be in shortly to assess her father.  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 She lives in [place] and has not had a good relationship with her family.  She states her brother and his wife- Collier Flowers to care for her father. They did not communicate with her, only in the case of an emergency.  Her brother died in a car accident 2 years ago. So Horris Latino, has been with her father. She was very upet when she arrived to see the house situation. State Horris Latino does not have a car, she is disable, uses a friend for transportation. They do not work with or take the patient out, he is isolated most of the time. State her father gets $1500 a month. The House is still in his name.  She is thinking down the road what will she do for placement?  For now Horris Latino wants him back home. Benjamine Mola is agreeable for now. But would like to maybe one day move him to a facility near her.  Plan to set up [place] with [facility] she accepted the referral.    I order [place] consult, updated Mardene Celeste about the situation. Daughter needing HCPOA. Second, patient walked 120 feet with PT, he is not skillable.  MD came to the bedside, during out conversion, she called back, feeling better about having a plan.                Expected Discharge Plan: Skilled Nursing Facility Barriers to  Discharge: Family Issues   Patient Goals and CMS Choice   CMS Medicare.gov Compare Post Acute Care list provided to:: Patient Represenative (must comment)(Daughter) Choice offered to / list presented to : Adult Children  Expected Discharge Plan and Services Expected Discharge Plan: [place] In-house Referral: Clinical Social Work   Post Acute Care Choice: Home Health       HH Arranged: RN, PT, Social Work [place] Agency: [place] ([place]) Date [place]: 02/27/19 Time [place]: [phone] Representative spoke with at [place]: [place] Arrangements/Services   Lives with:: Relatives          Need for Family Participation in Patient Care: Yes (Comment) Care giver support system in place?: Yes (comment)   Criminal Activity/Legal Involvement Pertinent to Current Situation/Hospitalization: No - Comment as needed  Activities of Daily Living Home Assistive Devices/Equipment: Cane (specify quad or straight), Walker (specify type) ADL Screening (condition at time of admission) Patient's cognitive ability adequate to safely complete daily activities?: No Is the patient deaf or have difficulty hearing?: Yes Does the patient have difficulty seeing, even when wearing glasses/contacts?: No Does the patient have difficulty concentrating, remembering, or making decisions?: Yes Patient able to express need for assistance with ADLs?: Yes Does the patient have difficulty  dressing or bathing?: Yes Independently performs ADLs?: No Communication: Independent Dressing (OT): Needs assistance Is this a change from baseline?: Pre-admission baseline Grooming: Needs assistance Is this a change from baseline?: Pre-admission baseline Bathing: Independent Toileting: Needs assistance Is this a change from baseline?: Pre-admission baseline In/Out Bed: Needs assistance Is this a change from baseline?: Pre-admission baseline Walks in Home:  Needs assistance Is this a change from baseline?: Pre-admission baseline Does the patient have difficulty walking or climbing stairs?: Yes Weakness of Legs: Both Weakness of Arms/Hands: Both  Permission Sought/Granted Permission sought to share information with : Case Manager Permission granted to share information with : Yes, Verbal Permission Granted        Permission granted to share info w Relationship: Derrek GuElizabeth Lyles - daughter     Emotional Assessment       Orientation: : Oriented to Self      Admission diagnosis:  Altered mental status, unspecified altered mental status type [R41.82] Patient Active Problem List   Diagnosis Date Noted  . Acute metabolic encephalopathy 02/27/2019  . Confusion 02/26/2019  . Hematuria 02/26/2019  . Dementia (HCC)   . HOH (hard of hearing)   . Mitral valve disease   . BPH (benign prostatic hyperplasia)   . Bradycardia from beta blocker   . Hallucinations   . Secondary hypercoagulable state (HCC)   . Encounter for therapeutic drug monitoring 08/27/2013  . Long term (current) use of anticoagulants 11/30/2010  . CORONARY ATHEROSCLEROSIS NATIVE CORONARY ARTERY 01/01/2010  . MITRAL VALVE DISORDER 01/01/2010  . Aortic valve disorder 01/01/2010  . Essential hypertension, benign 09/11/2009  . Chronic atrial fibrillation 09/11/2009   PCP:  Patient, No Pcp Per Pharmacy:   [facility] - [place], [place] - [address] [place] [address] Phone: [phone] Fax: [phone]     Social Determinants of Health (SDOH) Interventions    Readmission Risk Interventions No flowsheet data found.

## 2019-02-27 NOTE — Progress Notes (Signed)
PROGRESS NOTE  Adonus Uselman IZT:[id number] DOB: 11/01/1927 DOA: 02/26/2019 PCP: Patient, No Pcp Per  Brief History:  85 year old male with a history of dementia, bioprosthetic AVR 2011, nonobstructive CAD, hypertension, permanent atrial fibrillation presenting with 2 to 3-week history of confusion, visual and auditory hallucinations.  The patient visited the emergency department on 02/17/2019 for altered mental status and confusion.  He was found to have a UTI and sent home with ciprofloxacin.  Urine culture subsequently grew Staphylococcus epidermidis.  The patient finished a 7-day course of ciprofloxacin.  During the past week, his family has noted the patient to be more aggressive with continued visual and auditory hallucinations.  Other than the ciprofloxacin, the patient has not had any new medications.  He does not take anything over-the-counter.  At baseline, the patient does have some "pleasant confusion", but his daughter states that she has not seen him have hallucinations and delusions in the past.  The patient himself denies any fevers, chills, chest pain, shortness of breath, coughing, headache, nausea, vomiting, diarrhea, abdominal pain, dysuria, hematuria.  Upon presentation, CT of the brain was negative.  INR was 4.7.  EKG shows sinus rhythm with right bundle branch block.  BMP showed a serum creatinine 1.18.  WBC was 5.6 with hemoglobin 14.4.  Assessment/Plan: Acute toxic/metabolic encephalopathy -The patient likely has gradual progression of his underlying dementia -His recent delirium/decompensation is in part due to his recent UTI as well as ciprofloxacin which in and of itself may cause altered mental status. -Check serum J82 -Check folic acid -Check RPR -TSH 2.140 -02/26/2019 UA no significant pyuria -MRI brain  Coagulopathy/supratherapeutic INR -Elevated INR in part due to the patient's recent ciprofloxacin interaction -Give vitamin K 2.5 mg x 1 -Continue  to follow daily INR -Monitor for signs of bleeding -Hemoglobin remained stable  Permanent atrial fibrillation -Currently in sinus rhythm -Continue anticoagulation/Coumadin -Rate controlled -Atenolol was discontinued secondary to patient's bradycardia  Thrombocytopenia -Likely related to the patient's warfarin -TSH 2.140 -Serum N05 and folic acid  Hyperkalemia -5.3 at the time of admission -Improved  Essential hypertension -Holding atenolol secondary to bradycardia -Holding lisinopril -BP remains acceptable    Total time spent 45 minutes.  Greater than 50% spent face to face counseling and coordinating care.    Disposition Plan:   Home 8/13 if stable Family Communication:   Daughter updated at bedside 8/13  Consultants:  none  Code Status:  FULL  DVT Prophylaxis: coumadin   Procedures: As Listed in Progress Note Above  Antibiotics: None       Subjective: Patient denies fevers, chills, headache, chest pain, dyspnea, nausea, vomiting, diarrhea, abdominal pain, dysuria, hematuria, hematochezia, and melena.   Objective: Vitals:   02/26/19 1507 02/26/19 2035 02/26/19 2300 02/27/19 0556  BP: (!) 141/55  113/65 (!) 138/97  Pulse:   (!) 53 74  Resp: 20  20 18   Temp: 98.1 F (36.7 C)  98 F (36.7 C) 98 F (36.7 C)  TempSrc: Oral  Oral Oral  SpO2: 100% 98% 95% 99%  Weight: 82.4 kg     Height: 5\' 11"  (1.803 m)       Intake/Output Summary (Last 24 hours) at 02/27/2019 1119 Last data filed at 02/27/2019 1024 Gross per 24 hour  Intake 240 ml  Output -  Net 240 ml   Weight change:  Exam:   General:  Pt is alert, follows commands appropriately, not in acute distress  HEENT: No icterus,  No thrush, No neck mass, [place]/AT  Cardiovascular: RRR, S1/S2, no rubs, no gallops  Respiratory: bibasilar rales. No wheeze  Abdomen: Soft/+BS, non tender, non distended, no guarding  Extremities: No edema, No lymphangitis, No petechiae, No rashes, no synovitis    Data Reviewed: I have personally reviewed following labs and imaging studies Basic Metabolic Panel: Recent Labs  Lab 02/26/19 1215 02/27/19 0622  NA 141 139  K 5.3* 4.6  CL 107 105  CO2 27 26  GLUCOSE 102* 152*  BUN 55* 42*  CREATININE 1.18 0.97  CALCIUM 9.8 9.4  MG  --  2.4   Liver Function Tests: No results for input(s): AST, ALT, ALKPHOS, BILITOT, PROT, ALBUMIN in the last 168 hours. No results for input(s): LIPASE, AMYLASE in the last 168 hours. No results for input(s): AMMONIA in the last 168 hours. Coagulation Profile: Recent Labs  Lab 02/26/19 1215 02/27/19 0622  INR 4.7* 5.4*   CBC: Recent Labs  Lab 02/26/19 1215 02/27/19 0622  WBC 5.6 4.9  NEUTROABS  --  3.0  HGB 14.4 15.0  HCT 45.4 46.8  MCV 95.0 95.7  PLT 125* 120*   Cardiac Enzymes: No results for input(s): CKTOTAL, CKMB, CKMBINDEX, TROPONINI in the last 168 hours. BNP: Invalid input(s): POCBNP CBG: No results for input(s): GLUCAP in the last 168 hours. HbA1C: No results for input(s): HGBA1C in the last 72 hours. Urine analysis:    Component Value Date/Time   COLORURINE YELLOW 02/26/2019 1101   APPEARANCEUR CLEAR 02/26/2019 1101   LABSPEC 1.013 02/26/2019 1101   PHURINE 6.0 02/26/2019 1101   GLUCOSEU NEGATIVE 02/26/2019 1101   HGBUR LARGE (A) 02/26/2019 1101   BILIRUBINUR NEGATIVE 02/26/2019 1101   KETONESUR NEGATIVE 02/26/2019 1101   PROTEINUR NEGATIVE 02/26/2019 1101   UROBILINOGEN 0.2 10/23/2009 1700   NITRITE NEGATIVE 02/26/2019 1101   LEUKOCYTESUR TRACE (A) 02/26/2019 1101   Sepsis Labs: @LABRCNTIP (procalcitonin:4,lacticidven:4) ) Recent Results (from the past 240 hour(s))  SARS CORONAVIRUS 2 Nasal Swab Aptima Multi Swab     Status: None   Collection Time: 02/17/19  3:08 PM   Specimen: Aptima Multi Swab; Nasal Swab  Result Value Ref Range Status   SARS Coronavirus 2 NEGATIVE NEGATIVE Final    Comment: (NOTE) SARS-CoV-2 target nucleic acids are NOT DETECTED. The SARS-CoV-2  RNA is generally detectable in upper and lower respiratory specimens during the acute phase of infection. Negative results do not preclude SARS-CoV-2 infection, do not rule out co-infections with other pathogens, and should not be used as the sole basis for treatment or other patient management decisions. Negative results must be combined with clinical observations, patient history, and epidemiological information. The expected result is Negative. Fact Sheet for Patients: HairSlick.nohttps://www.fda.gov/media/138098/download Fact Sheet for Healthcare Providers: quierodirigir.comhttps://www.fda.gov/media/138095/download This test is not yet approved or cleared by the [place] FDA and  has been authorized for detection and/or diagnosis of SARS-CoV-2 by FDA under an Emergency Use Authorization (EUA). This EUA will remain  in effect (meaning this test can be used) for the duration of the COVID-19 declaration under Section 56 4(b)(1) of the Act, 21 U.S.C. section 360bbb-3(b)(1), unless the authorization is terminated or revoked sooner. Performed at [facility], [address] [place], [place] [address]   Urine Culture     Status: Abnormal   Collection Time: 02/17/19  3:58 PM   Specimen: Urine, Clean Catch  Result Value Ref Range Status   Specimen Description   Final    URINE, CLEAN CATCH Performed at [facility], [address] [place], [place] [address]    Special Requests   Final    NONE Performed at [facility], [address] [place], [place] [address]    Culture >=100,000 COLONIES/mL STAPHYLOCOCCUS EPIDERMIDIS (A)  Final   Report Status 02/20/2019 FINAL  Final   Organism ID, Bacteria STAPHYLOCOCCUS EPIDERMIDIS (A)  Final      Susceptibility   Staphylococcus epidermidis - MIC*    CIPROFLOXACIN <=0.5 SENSITIVE Sensitive     GENTAMICIN <=0.5 SENSITIVE Sensitive     NITROFURANTOIN <=16 SENSITIVE Sensitive     OXACILLIN <=0.25 SENSITIVE Sensitive     TETRACYCLINE 2 SENSITIVE Sensitive      VANCOMYCIN <=0.5 SENSITIVE Sensitive     TRIMETH/SULFA <=10 SENSITIVE Sensitive     CLINDAMYCIN <=0.25 SENSITIVE Sensitive     RIFAMPIN <=0.5 SENSITIVE Sensitive     Inducible Clindamycin NEGATIVE Sensitive     * >=100,000 COLONIES/mL STAPHYLOCOCCUS EPIDERMIDIS  SARS CORONAVIRUS 2 Nasal Swab Aptima Multi Swab     Status: None   Collection Time: 02/26/19  1:53 PM   Specimen: Aptima Multi Swab; Nasal Swab  Result Value Ref Range Status   SARS Coronavirus 2 NEGATIVE NEGATIVE Final    Comment: (NOTE) SARS-CoV-2 target nucleic acids are NOT DETECTED. The SARS-CoV-2 RNA is generally detectable in upper and lower respiratory specimens during the acute phase of infection. Negative results do not preclude SARS-CoV-2 infection, do not rule out co-infections with other pathogens, and should not be used as the sole basis for treatment or other patient management decisions. Negative results must be combined with clinical observations, patient history, and epidemiological information. The expected result is Negative. Fact Sheet for Patients: HairSlick.nohttps://www.fda.gov/media/138098/download Fact Sheet for Healthcare Providers: quierodirigir.comhttps://www.fda.gov/media/138095/download This test is not yet approved or cleared by the [place] FDA and  has been authorized for detection and/or diagnosis of SARS-CoV-2 by FDA under an Emergency Use Authorization (EUA). This EUA will remain  in effect (meaning this test can be used) for the duration of the COVID-19 declaration under Section 56 4(b)(1) of the Act, 21 U.S.C. section 360bbb-3(b)(1), unless the authorization is terminated or revoked sooner. Performed at [facility], [address] [place], [place] [address]      Scheduled Meds: . LORazepam  0.5 mg Intravenous Once  . phytonadione  2.5 mg Oral Once  . sodium chloride flush  3 mL Intravenous Q12H   Continuous Infusions: . sodium chloride      Procedures/Studies: Dg Chest 2 View  Result  Date: 02/26/2019 CLINICAL DATA:  Altered mental status EXAM: CHEST - 2 VIEW COMPARISON:  [facility] FINDINGS: Cardiomediastinal silhouette remains mildly enlarged. Median sternotomy wires and prior cardiac valve replacement. Calcific aortic knob. Mild pulmonary vascular congestion. No focal airspace consolidation. No pleural effusion or pneumothorax. Diffuse osseous demineralization. Remote right-sided rib fractures. IMPRESSION: Cardiomegaly and mild pulmonary vascular congestion which may reflect mild CHF/fluid overload. Electronically Signed   By: Duanne GuessNicholas  Plundo M.D.   On: 02/26/2019 13:15   Ct Head Wo Contrast  Result Date: 02/26/2019 CLINICAL DATA:  Confusion for the past 2 weeks. EXAM: CT HEAD WITHOUT CONTRAST TECHNIQUE: Contiguous axial images were obtained from the base of the skull through the vertex without intravenous contrast. COMPARISON:  None. FINDINGS: Brain: No evidence of acute infarction, hemorrhage, hydrocephalus, extra-axial collection or mass lesion/mass effect. Mild generalized cerebral atrophy. Vascular: Atherosclerotic vascular calcification of the carotid siphons. No hyperdense vessel. Skull: Normal. Negative for fracture or focal lesion. Sinuses/Orbits: Partial opacification of  the right mastoid air cells. Otherwise unremarkable. Other: None. IMPRESSION: 1.  No acute intracranial abnormality. Electronically Signed   By: Obie DredgeWilliam T Derry M.D.   On: 02/26/2019 13:22   Dg Chest Port 1 View  Result Date: 02/17/2019 CLINICAL DATA:  Per daughter patient has hx of dementia but has significant altered mental status in past 2 weeks. Patient is having auditory and visual hallucinations. Patient wandering away from home. Patient tried to remove his clothes yesterday. No aggression. EXAM: PORTABLE CHEST 1 VIEW COMPARISON:  12/21/2009 FINDINGS: Stable changes from prior cardiac surgery. Cardiac silhouette mildly enlarged. No mediastinal or hilar masses. Mild opacity at the right lung base is similar  to the prior study. There is vascular prominence, but no convincing pulmonary edema. No evidence of pneumonia. No pleural effusion or pneumothorax. Skeletal structures are grossly intact. IMPRESSION: No acute cardiopulmonary disease. Electronically Signed   By: Amie Portlandavid  Ormond M.D.   On: 02/17/2019 16:14    Catarina Hartshornavid Gibson Telleria, DO  [facility] Pager [phone]  If 7PM-7AM, please contact night-coverage [email or web address] Password [email or web address] 02/27/2019, 11:19 AM   LOS: 0 days

## 2019-02-27 NOTE — Plan of Care (Signed)
  Problem: Acute Rehab PT Goals(only PT should resolve) Goal: Pt Will Go Supine/Side To Sit Outcome: Progressing Flowsheets (Taken 02/27/2019 1157) Pt will go Supine/Side to Sit: Independently Goal: Patient Will Transfer Sit To/From Stand Outcome: Progressing Flowsheets (Taken 02/27/2019 1157) Patient will transfer sit to/from stand: with modified independence Goal: Pt Will Transfer Bed To Chair/Chair To Bed Outcome: Progressing Flowsheets (Taken 02/27/2019 1157) Pt will Transfer Bed to Chair/Chair to Bed: with modified independence Goal: Pt Will Ambulate Outcome: Progressing Flowsheets (Taken 02/27/2019 1157) Pt will Ambulate:  > 125 feet  with supervision  with modified independence  with rolling walker   11:58 AM, 02/27/19 Lonell Grandchild, MPT Physical Therapist with [facility] [phone] office [phone] mobile phone

## 2019-02-27 NOTE — Progress Notes (Signed)
ANTICOAGULATION CONSULT NOTE -   Pharmacy Consult for Coumadin Indication: atrial fibrillation  Allergies  Allergen Reactions  . Penicillins Rash    .Did it involve swelling of the face/tongue/throat, SOB, or low BP? {No Did it involve sudden or severe rash/hives, skin peeling, or any reaction on the inside of your mouth or nose? Yes Did you need to seek medical attention at a hospital or doctor's office? Unknown When did it last happen?Childhood If all above answers are "NO", may proceed with cephalosporin use.      Patient Measurements: Height: 5\' 11"  (180.3 cm) Weight: 181 lb 10.5 oz (82.4 kg) IBW/kg (Calculated) : 75.3  Vital Signs: Temp: 98 F (36.7 C) (08/12 0556) Temp Source: Oral (08/12 0556) BP: 138/97 (08/12 0556) Pulse Rate: 74 (08/12 0556)  Labs: Recent Labs    02/26/19 1215 02/27/19 0622  HGB 14.4 15.0  HCT 45.4 46.8  PLT 125* 120*  LABPROT 43.7* 48.7*  INR 4.7* 5.4*  CREATININE 1.18 0.97    Estimated Creatinine Clearance: 55 mL/min (by C-G formula based on SCr of 0.97 mg/dL).   Medical History: Past Medical History:  Diagnosis Date  . Aortic valve disease    Status post AVR 2011  . Atrial fibrillation ([place])   . BPH (benign prostatic hyperplasia)   . CHF (congestive heart failure) ([place])   . Coronary atherosclerosis of native coronary artery    Nonobstructive  . Dementia ([place])   . Essential hypertension   . History of bacteremia    Streptococcus gordonii bacteremia 2/11 - no clear vegetation by TEE  . HOH (hard of hearing)   . Mitral valve disease    Status post annular repair 2011    Medications:  See med rec  Assessment: 85 y.o male with a PMH A. fi/b, CHF, CAD, dementia brought to the ED by daughter with altered mental status. Pharmacy asked to dose Coumadin.  INR is elevated at 5.4. (recently on cipro which can increase INR.   Home dose: patient states she take 7.5mg  daily  Goal of Therapy:  INR 2-3 Monitor platelets by  anticoagulation protocol: Yes   Plan:  No coumadin today Daily PT-INR Monitor for S/S of bleeding  Margot Ables, PharmD Clinical Pharmacist 02/27/2019 8:47 AM

## 2019-02-27 NOTE — Evaluation (Signed)
Physical Therapy Evaluation Patient Details Name: Jonelle SportsRufus Wyley Shanley MRN: [id number] DOB: [birth year] Today's Date: 02/27/2019   History of Present Illness  Demarr Irven EasterlyWyley Schliep is a 85 y.o. male with dementia who lives at home brought to the ED because he has been having confusion for the last several weeks.  He was seen here approximately 1 week ago and diagnosed with a UTI.  His urine culture was positive for staph epidermidis.  He was treated with 7 days of ciprofloxacin.  Family reports that he has been increasingly confused at home and has been having visual hallucinations and auditory hallucinations.  This is not his baseline.  They say that he has been having conversations with imagined individuals.  He has had no cough fever chills.  He has had leaking of urine which is not common for him.  Also of concern, patient has been more aggressive than his baseline according to daughter.  According to family members he was not having hallucinations when they saw him a month ago.  There is no history of falls.  No history of any other medication changes.  Family was concerned that he may have had another urinary tract infection.    Clinical Impression  Patient limited for functional mobility as stated below secondary to BLE weakness, fatigue and fair/poor standing balance.  Patient able to transfer to commode in bathroom without loss of balance, no problem with ambulation in hallways, but incontinent of urine x 2 occasional during visit.  Patient tolerated sitting up in chair after therapy with his daughter present in room. Patient will benefit from continued physical therapy in hospital and recommended venue below to increase strength, balance, endurance for safe ADLs and gait.     Follow Up Recommendations Home health PT;Supervision for mobility/OOB;Supervision/Assistance - 24 hour    Equipment Recommendations  None recommended by PT    Recommendations for Other Services       Precautions /  Restrictions Precautions Precautions: Fall Restrictions Weight Bearing Restrictions: No      Mobility  Bed Mobility Overal bed mobility: Modified Independent             General bed mobility comments: increased time  Transfers Overall transfer level: Needs assistance Equipment used: Rolling walker (2 wheeled) Transfers: Sit to/from [employer] CorporationStand;Stand Pivot Transfers Sit to Stand: Min guard Stand pivot transfers: Min guard       General transfer comment: slightly labored movement  Ambulation/Gait Ambulation/Gait assistance: Supervision;Min guard Gait Distance (Feet): 120 Feet Assistive device: Rolling walker (2 wheeled) Gait Pattern/deviations: Decreased step length - right;Decreased step length - left;Decreased stride length Gait velocity: decreased   General Gait Details: slightly labored cadence without loss of balance, incontinent of uring when walking, no loss of balance  Stairs            Wheelchair Mobility    Modified Rankin (Stroke Patients Only)       Balance Overall balance assessment: Needs assistance Sitting-balance support: Feet supported;No upper extremity supported Sitting balance-Leahy Scale: Good Sitting balance - Comments: seated at bedside   Standing balance support: During functional activity;No upper extremity supported Standing balance-Leahy Scale: Poor Standing balance comment: fair/poor without AD, fair/good using RW                             Pertinent Vitals/Pain      Home Living Family/patient expects to be discharged to:: Private residence Living Arrangements: Children Available Help at Discharge: Family;Available PRN/intermittently  Type of Home: House Home Access: Stairs to enter Entrance Stairs-Rails: Right;Left(to wide to reach both) Entrance Stairs-Number of Steps: 4 Home Layout: One level Home Equipment: Walker - 2 wheels;Shower seat      Prior Function Level of Independence: Needs assistance   Gait  / Transfers Assistance Needed: household ambulator with RW PRN  ADL's / Homemaking Assistance Needed: assisted by family        Hand Dominance        Extremity/Trunk Assessment   Upper Extremity Assessment Upper Extremity Assessment: Overall WFL for tasks assessed    Lower Extremity Assessment Lower Extremity Assessment: Generalized weakness    Cervical / Trunk Assessment Cervical / Trunk Assessment: Normal  Communication   Communication: HOH  Cognition Arousal/Alertness: Awake/alert Behavior During Therapy: WFL for tasks assessed/performed;Impulsive Overall Cognitive Status: Impaired/Different from baseline Area of Impairment: Attention;Safety/judgement;Awareness                   Current Attention Level: Selective     Safety/Judgement: Decreased awareness of safety     General Comments: slightly confused, but cooperative      General Comments      Exercises     Assessment/Plan    PT Assessment Patient needs continued PT services  PT Problem List Decreased strength;Decreased activity tolerance;Decreased balance;Decreased mobility       PT Treatment Interventions Gait training;Stair training;Functional mobility training;Therapeutic activities;Therapeutic exercise;Patient/family education;Balance training    PT Goals (Current goals can be found in the Care Plan section)  Acute Rehab PT Goals Patient Stated Goal: return home with family to assist PT Goal Formulation: With patient/family Time For Goal Achievement: 03/06/19 Potential to Achieve Goals: Good    Frequency Min 3X/week   Barriers to discharge        Co-evaluation               AM-PAC PT "6 Clicks" Mobility  Outcome Measure Help needed turning from your back to your side while in a flat bed without using bedrails?: None Help needed moving from lying on your back to sitting on the side of a flat bed without using bedrails?: None Help needed moving to and from a bed to a  chair (including a wheelchair)?: A Little Help needed standing up from a chair using your arms (e.g., wheelchair or bedside chair)?: None Help needed to walk in hospital room?: A Little Help needed climbing 3-5 steps with a railing? : A Little 6 Click Score: 21    End of Session   Activity Tolerance: Patient tolerated treatment well;Patient limited by fatigue Patient left: with family/visitor present;in chair;with call bell/phone within reach;with chair alarm set Nurse Communication: Mobility status PT Visit Diagnosis: Unsteadiness on feet (R26.81);Other abnormalities of gait and mobility (R26.89);Muscle weakness (generalized) (M62.81)    Time: 6222-9798 PT Time Calculation (min) (ACUTE ONLY): 34 min   Charges:   PT Evaluation $PT Eval Moderate Complexity: 1 Mod PT Treatments $Therapeutic Activity: 23-37 mins        11:56 AM, 02/27/19 Lonell Grandchild, MPT Physical Therapist with [facility] 336 [phone] office [phone] mobile phone

## 2019-02-27 NOTE — Progress Notes (Signed)
CRITICAL VALUE ALERT  Critical Value: INR 5.4  Date & Time Notied: 02-27-2019 0177  Provider Notified: Orson Eva MD  Orders Received/Actions taken: MD paged, Daytime nurse made aware for potential orders.

## 2019-02-27 NOTE — Progress Notes (Signed)
*  PRELIMINARY RESULTS* Echocardiogram 2D Echocardiogram has been performed.  Samuel Germany 02/27/2019, 12:49 PM

## 2019-02-28 ENCOUNTER — Telehealth: Payer: Self-pay | Admitting: Family Medicine

## 2019-02-28 LAB — FOLATE RBC
Folate, Hemolysate: 454 ng/mL
Folate, RBC: 885 ng/mL (ref >498)
Hematocrit: 51.3 % — ABNORMAL HIGH (ref 37.5–51.0)

## 2019-02-28 LAB — CBC
HCT: 51.1 % (ref 39.0–52.0)
Hemoglobin: 16.1 g/dL (ref 13.0–17.0)
MCH: 30.4 pg (ref 26.0–34.0)
MCHC: 31.5 g/dL (ref 30.0–36.0)
MCV: 96.4 fL (ref 80.0–100.0)
Platelets: 151 10*3/uL (ref 150–400)
RBC: 5.3 MIL/uL (ref 4.22–5.81)
RDW: 12.5 % (ref 11.5–15.5)
WBC: 7.2 10*3/uL (ref 4.0–10.5)
nRBC: 0 % (ref 0.0–0.2)

## 2019-02-28 LAB — BASIC METABOLIC PANEL
Anion gap: 12 (ref 5–15)
BUN: 44 mg/dL — ABNORMAL HIGH (ref 8–23)
CO2: 24 mmol/L (ref 22–32)
Calcium: 9.3 mg/dL (ref 8.9–10.3)
Chloride: 102 mmol/L (ref 98–111)
Creatinine, Ser: 1.04 mg/dL (ref 0.61–1.24)
GFR calc Af Amer: >60 mL/min (ref >60)
GFR calc non Af Amer: >60 mL/min (ref >60)
Glucose, Bld: 111 mg/dL — ABNORMAL HIGH (ref 70–99)
Potassium: 3.6 mmol/L (ref 3.5–5.1)
Sodium: 138 mmol/L (ref 135–145)

## 2019-02-28 LAB — PROTIME-INR
INR: 2.3 — ABNORMAL HIGH (ref 0.8–1.2)
Prothrombin Time: 24.9 seconds — ABNORMAL HIGH (ref 11.4–15.2)

## 2019-02-28 LAB — RPR: RPR Ser Ql: NONREACTIVE

## 2019-02-28 MED ORDER — WARFARIN SODIUM 5 MG PO TABS
10.0000 mg | ORAL_TABLET | Freq: Once | ORAL | Status: AC
  Administered 2019-02-28: 10 mg via ORAL
  Filled 2019-02-28: qty 2

## 2019-02-28 MED ORDER — LISINOPRIL 2.5 MG PO TABS: ORAL_TABLET | ORAL | 2 refills | Status: DC

## 2019-02-28 MED ORDER — WARFARIN - PHYSICIAN DOSING INPATIENT: Freq: Every day | Status: DC

## 2019-02-28 MED ORDER — ACETAMINOPHEN 325 MG PO TABS: 650.0000 mg | ORAL_TABLET | ORAL | 1 refills | Status: AC | PRN

## 2019-02-28 MED ORDER — ALPRAZOLAM 0.25 MG PO TABS: 0.2500 mg | ORAL_TABLET | Freq: Two times a day (BID) | ORAL | 0 refills | Status: DC | PRN

## 2019-02-28 MED ORDER — WARFARIN SODIUM 5 MG PO TABS: 7.5000 mg | ORAL_TABLET | Freq: Every morning | ORAL | 1 refills | Status: DC

## 2019-02-28 MED ORDER — WARFARIN SODIUM 5 MG PO TABS: 7.5000 mg | ORAL_TABLET | Freq: Every morning | ORAL | 1 refills | Status: AC

## 2019-02-28 MED ORDER — FUROSEMIDE 20 MG PO TABS
20.0000 mg | ORAL_TABLET | Freq: Once | ORAL | Status: AC
  Administered 2019-02-28: 20 mg via ORAL
  Filled 2019-02-28: qty 1

## 2019-02-28 NOTE — Progress Notes (Signed)
Pt IV removed, WNL. D/C instructions given to pt and daughter, verbalized understanding. Pt will be transported home via daughter after lunch.

## 2019-02-28 NOTE — TOC Transition Note (Signed)
Transition of Care [facility]) - CM/SW Discharge Note   Patient Details  Name: Phillip Nolan MRN: [id number] Date of Birth: 1928-05-01  [facility] CM/SW Contact:  Boneta Lucks, RN Phone Number: 02/28/2019, 11:02 AM   Clinical Narrative:  Patient up for discharge, Daughter- Benjamine Mola is at the, continues to be very stressed, MD and chaplain also visiting with daughter. She is much better by the end of the conversation and thanking CM for everyone help. Explained plan for [facility] HHPT/RN/SW. Linda with [facility] at the bedside at present to explain and set up services. Benjamine Mola will be back in town on Monday to take her Father to PCP - Benny Lennert at [place].  We are working to coordinate SW time with the daughter on Monday morning.  She has a lot of concerns and unsure how to plan for his future. Living out of town with her own stresses at home has complicated the family situation.     Final next level of care: Home w Home Health Services Barriers to Discharge: Barriers Resolved   Patient Goals and CMS Choice   CMS Medicare.gov Compare Post Acute Care list provided to:: Patient Represenative (must comment)(Daughter) Choice offered to / list presented to : Adult Children  Discharge Placement                  Name of family member notified: Benjamine Mola- daughter  She at the bedside, waiting for discharge and will take him home.    Discharge Plan and Services In-house Referral: Clinical Social Work   Post Acute Care Choice: Home Health                    HH Arranged: RN, PT, Social Work [facility] Agency: [place] ([place]) Date [place]: 02/27/19 Time [place]: [phone] Representative spoke with at [place]: Romualdo Bolk      Readmission Risk Interventions No flowsheet data found.

## 2019-02-28 NOTE — Telephone Encounter (Signed)
Patients daughter Darden Palmer is calling and states the patient is currently in the hospital and has a visit on Monday 03/04/19 that she will be attending with him as he needs assistance. She was told he is going to need a coumadin check at this visit and she is wanting to confirm that it will be able to be done. She can be reached at [phone].

## 2019-02-28 NOTE — Discharge Instructions (Signed)
1) stop atenolol due to low blood pressure and slow heart rate 2) decrease lisinopril to 2.5 mg daily due to concerns about blood pressure 3) recheck PT/INR with your new primary care physician on Monday, 03/04/2019-----your Coumadin/warfarin dose can be adjusted after these 4) take Coumadin/warfarin 7.5 mg daily starting 03/01/2019 until your INR is rechecked on 03/04/2019 by your new primary care physician 5) since you are taking Coumadin which is a blood thinner Avoid ibuprofen/Advil/Aleve/Motrin/Goody Powders/Naproxen/BC powders/Meloxicam/Diclofenac/Indomethacin and other Nonsteroidal anti-inflammatory medications as these will make you more likely to bleed and can cause stomach ulcers, can also cause Kidney problems.  6) home health physical therapy, registered nurse and social worker will help you after you get home 7) please do not leave patient alone--- due to concerns about dementia related cognitive and memory problems which will make it difficult for patient to make "good decisions"

## 2019-02-28 NOTE — ACP (Advance Care Planning) (Signed)
Assisted Phillip Nolan with the completion of his [place].At the time of our discussion, he stated clearly that he wanted his daughter to make health care decisions if he were unable to make or communicate them. I placed a copy of the document in his physical chart to be scanned in on discharge.

## 2019-02-28 NOTE — Discharge Summary (Signed)
Phillip Nolan, is a 85 y.o. male  DOB March 12, 1928  MRN [id number].  Admission date:  02/26/2019  Admitting Physician  Cleora Fleet, MD  Discharge Date:  02/28/2019   Primary MD  Wandra Feinstein, MD  Recommendations for primary care physician for things to follow:   1)Stop atenolol due to low blood pressure and slow heart rate 2) decrease lisinopril to 2.5 mg daily due to concerns about blood pressure 3) recheck PT/INR with your new primary care physician on Monday, 03/04/2019-----your Coumadin/warfarin dose can be adjusted after these 4) take Coumadin/warfarin 7.5 mg daily starting 03/01/2019 until your INR is rechecked on 03/04/2019 by your new primary care physician 5) since you are taking Coumadin which is a blood thinner Avoid ibuprofen/Advil/Aleve/Motrin/Goody Powders/Naproxen/BC powders/Meloxicam/Diclofenac/Indomethacin and other Nonsteroidal anti-inflammatory medications as these will make you more likely to bleed and can cause stomach ulcers, can also cause Kidney problems.  6) home health physical therapy, registered nurse and social worker will help you after you get home 7) please do not leave patient alone--- due to concerns about dementia related cognitive and memory problems which will make it difficult for patient to make "good decisions"   Admission Diagnosis  Altered mental status, unspecified altered mental status type [R41.82]   Discharge Diagnosis  Altered mental status, unspecified altered mental status type [R41.82]    Principal Problem:   Acute metabolic encephalopathy Active Problems:   Dementia (HCC)   Chronic atrial fibrillation   Long term (current) use of anticoagulants   Essential hypertension, benign   CORONARY ATHEROSCLEROSIS NATIVE CORONARY ARTERY   Aortic valve disorder   Confusion   HOH (hard of hearing)   Mitral valve disease   BPH (benign prostatic  hyperplasia)   Bradycardia from beta blocker   Hallucinations   Secondary hypercoagulable state (HCC)   Hematuria      Past Medical History:  Diagnosis Date   Aortic valve disease    Status post AVR 2011   Atrial fibrillation (HCC)    BPH (benign prostatic hyperplasia)    CHF (congestive heart failure) (HCC)    Coronary atherosclerosis of native coronary artery    Nonobstructive   Dementia (HCC)    Essential hypertension    History of bacteremia    Streptococcus gordonii bacteremia 2/11 - no clear vegetation by TEE   HOH (hard of hearing)    Mitral valve disease    Status post annular repair 2011    Past Surgical History:  Procedure Laterality Date   Median sterntomy  4/11   Dr. Cornelius Moras - AVR (27 mm Edwards pericardial) and MV repair   MULTIPLE TOOTH EXTRACTIONS  4/11   (pre-op above)     HPI  from the history and physical done on the day of admission:    Chief Complaint: confusion   HPI: Phillip Nolan is a 85 y.o. male with dementia who lives at home brought to the ED because he has been having confusion for the last several weeks.  He was seen  here approximately 1 week ago and diagnosed with a UTI.  His urine culture was positive for staph epidermidis.  He was treated with 7 days of ciprofloxacin.  Family reports that he has been increasingly confused at home and has been having visual hallucinations and auditory hallucinations.  This is not his baseline.  They say that he has been having conversations with imagined individuals.  He has had no cough fever chills.  He has had leaking of urine which is not common for him.  Also of concern, patient has been more aggressive than his baseline according to daughter.  According to family members he was not having hallucinations when they saw him a month ago.  There is no history of falls.  No history of any other medication changes.  Family was concerned that he may have had another urinary tract infection.  ED  course: Patient noted to be having auditory hallucinations and confusion.  He was noted to have an elevated BNP of 130 with chest x-ray findings of mild CHF.  Potassium elevated at 5.3.  Normal creatinine.  INR elevated at 4.7.  Urinalysis positive for WBC 6-10 and rare bacteria.  CT brain negative for acute hemorrhage.  No infarct seen.  Personally reviewed chest x-ray with findings of mild pulmonary vascular congestion and mild CHF.  COVID-19 testing pending.  Patient was admitted for further observation.   Review of Systems: Unable to obtain due to dementia and confusion.   Hospital Course:     Brief History:  85 year old male with a history of dementia, bioprosthetic AVR 2011, nonobstructive CAD, hypertension, permanent atrial fibrillation presenting with 2 to 3-week history of confusion, visual and auditory hallucinations.  The patient visited the emergency department on 02/17/2019 for altered mental status and confusion.  He was found to have a UTI and sent home with ciprofloxacin.  Urine culture subsequently grew Staphylococcus epidermidis.  The patient finished a 7-day course of ciprofloxacin.  During the past week, his family has noted the patient to be more aggressive with continued visual and auditory hallucinations.  Other than the ciprofloxacin, the patient has not had any new medications.  He does not take anything over-the-counter.  At baseline, the patient does have some "pleasant confusion", but his daughter states that she has not seen him have hallucinations and delusions in the past.  The patient himself denies any fevers, chills, chest pain, shortness of breath, coughing, headache, nausea, vomiting, diarrhea, abdominal pain, dysuria, hematuria.  Upon presentation, CT of the brain was negative.  INR was 4.7.  EKG shows sinus rhythm with right bundle branch block.  BMP showed a serum creatinine 1.18.  WBC was 5.6 with hemoglobin 14.4.  Assessment/Plan: Acute toxic/metabolic  encephalopathy -The patient likely has gradual progression of his underlying dementia -His recent delirium/decompensation is in part due to his recent UTI as well as ciprofloxacin which in and of itself may cause altered mental status. -RPR is nonreactive, folate and B12 are not low, TSH is 2.1 -As per daughter Phillip Nolan who is at bedside, patient's mentation is back to baseline at this time  Coagulopathy/supratherapeutic INR -Elevated INR in part due to the patient's recent ciprofloxacin interaction -Received vitamin K 2.5 mg x 1 -INR is down to 2.3 INR goal is 2.0-3.0 No bleeding concerns, Hgb stable between 14 and 15 -Okay to give Coumadin 10 mg p.o. x1 today and then restart 7.5 mg daily starting 03/01/2019 -INR recheck with new PCP on Monday, 03/04/2019  Permanent atrial fibrillation -Currently  in sinus rhythm -Continue anticoagulation/Coumadin as above -Rate controlled -Atenolol was discontinued secondary to patient's bradycardia  Thrombocytopenia -Likely related to the patient's warfarin -TSH 2.140 -Serum B12 and folic acid -Platelets stable around 120k  Essential hypertension -Holding atenolol secondary to bradycardia -Decrease lisinopril to 2.5 mg daily due to soft BP  HFrEF--history of chronic systolic dysfunction CHF, largely asymptomatic at this time -Echo from this admission is largely unchanged from prior (07/2016)with EF in the 45 to 50% range --Lisinopril reduced to 2.5 mg due to BP concerns -Admission chest x-ray noted -No respiratory distress, no shortness of breath, no lower extremity edema, - O2 sats 100% on room air -May need Lasix from time to time    Disposition Plan:   Home 02/28/19 with home health services Family Communication:   Daughter updated at bedside 02/28/19 -As per daughter Phillip Reasoner who is at bedside, patient's mentation is back to baseline at this time  Consultants:  none  Code Status:  FULL  Discharge Condition:  Stable  Follow UP  Follow-up Information    Jonelle Sidle, MD Follow up on 03/05/2019.   Specialty: Cardiology Why: COUMADIN CHECK ONLY on 03/05/2019 at 9:00 AM.  Contact information: [address] [place] [address] [phone]        [facility] Follow up.   Specialty: Home Health Services Why: PT/RN/SW         Diet and Activity recommendation:  As advised  Discharge Instructions    Discharge Instructions    Call MD for:  difficulty breathing, headache or visual disturbances   Complete by: As directed    Call MD for:  persistant dizziness or light-headedness   Complete by: As directed    Call MD for:  persistant nausea and vomiting   Complete by: As directed    Call MD for:  severe uncontrolled pain   Complete by: As directed    Call MD for:  temperature >100.4   Complete by: As directed    Diet general   Complete by: As directed    Discharge instructions   Complete by: As directed    1) stop atenolol due to low blood pressure and slow heart rate 2) decrease lisinopril to 2.5 mg daily due to concerns about blood pressure 3) recheck PT/INR with your new primary care physician on Monday, 03/04/2019-----your Coumadin/warfarin dose can be adjusted after these 4) take Coumadin/warfarin 7.5 mg daily starting 03/01/2019 until your INR is rechecked on 03/04/2019 by your new primary care physician 5) since you are taking Coumadin which is a blood thinner Avoid ibuprofen/Advil/Aleve/Motrin/Goody Powders/Naproxen/BC powders/Meloxicam/Diclofenac/Indomethacin and other Nonsteroidal anti-inflammatory medications as these will make you more likely to bleed and can cause stomach ulcers, can also cause Kidney problems.  6) home health physical therapy, registered nurse and social worker will help you after you get home 7) please do not leave patient alone--- due to concerns about dementia related cognitive and memory problems which will make it difficult  for patient to make "good decisions"   Increase activity slowly   Complete by: As directed         Discharge Medications     Allergies as of 02/28/2019      Reactions   Penicillins Rash   .Did it involve swelling of the face/tongue/throat, SOB, or low BP? {No Did it involve sudden or severe rash/hives, skin peeling, or any reaction on the inside of your mouth or nose? Yes Did you need to seek  medical attention at a hospital or doctor's office? Unknown When did it last happen?Childhood If all above answers are NO, may proceed with cephalosporin use.      Medication List    STOP taking these medications   atenolol 25 MG tablet Commonly known as: TENORMIN     TAKE these medications   acetaminophen 325 MG tablet Commonly known as: TYLENOL Take 2 tablets (650 mg total) by mouth every 4 (four) hours as needed for headache or mild pain.   ALPRAZolam 0.25 MG tablet Commonly known as: XANAX Take 1 tablet (0.25 mg total) by mouth 2 (two) times daily as needed for anxiety or sleep.   lisinopril 2.5 MG tablet Commonly known as: ZESTRIL Take 1 tablet daily What changed: medication strength   warfarin 5 MG tablet Commonly known as: COUMADIN Take as directed. If you are unsure how to take this medication, talk to your nurse or doctor. Original instructions: Take 1.5 tablets (7.5 mg total) by mouth every morning. TAKE 1&1/2 TABLETS DAILY or as directed Start taking on: March 01, 2019       Major procedures and Radiology Reports - PLEASE review detailed and final reports for all details, in brief -   Dg Chest 2 View  Result Date: 02/26/2019 CLINICAL DATA:  Altered mental status EXAM: CHEST - 2 VIEW COMPARISON:  [facility] FINDINGS: Cardiomediastinal silhouette remains mildly enlarged. Median sternotomy wires and prior cardiac valve replacement. Calcific aortic knob. Mild pulmonary vascular congestion. No focal airspace consolidation. No pleural effusion or pneumothorax.  Diffuse osseous demineralization. Remote right-sided rib fractures. IMPRESSION: Cardiomegaly and mild pulmonary vascular congestion which may reflect mild CHF/fluid overload. Electronically Signed   By: Duanne GuessNicholas  Plundo M.D.   On: 02/26/2019 13:15   Ct Head Wo Contrast  Result Date: 02/26/2019 CLINICAL DATA:  Confusion for the past 2 weeks. EXAM: CT HEAD WITHOUT CONTRAST TECHNIQUE: Contiguous axial images were obtained from the base of the skull through the vertex without intravenous contrast. COMPARISON:  None. FINDINGS: Brain: No evidence of acute infarction, hemorrhage, hydrocephalus, extra-axial collection or mass lesion/mass effect. Mild generalized cerebral atrophy. Vascular: Atherosclerotic vascular calcification of the carotid siphons. No hyperdense vessel. Skull: Normal. Negative for fracture or focal lesion. Sinuses/Orbits: Partial opacification of the right mastoid air cells. Otherwise unremarkable. Other: None. IMPRESSION: 1.  No acute intracranial abnormality. Electronically Signed   By: Obie DredgeWilliam T Derry M.D.   On: 02/26/2019 13:22   Mr Brain Wo Contrast  Result Date: 02/27/2019 CLINICAL DATA:  Altered level of consciousness. EXAM: MRI HEAD WITHOUT CONTRAST TECHNIQUE: Multiplanar, multiecho pulse sequences of the brain and surrounding structures were obtained without intravenous contrast. COMPARISON:  CT head 02/26/2019 FINDINGS: Brain: Mild atrophy.  Negative for hydrocephalus. Negative for acute infarct. Small white matter hyperintensities, minimal in degree. Chronic microhemorrhage in the right parietal lobe. Negative for mass or fluid collection. No midline shift. Vascular: Normal arterial flow voids. Skull and upper cervical spine: Negative Sinuses/Orbits: Paranasal sinuses clear. Right mastoid effusion. Left mastoid clear. Negative orbit. Other: None IMPRESSION: Negative for acute infarct Generalized atrophy without hydrocephalus. Minimal chronic white matter changes. Chronic  microhemorrhage in the right parietal lobe. Electronically Signed   By: Marlan Palauharles  Clark M.D.   On: 02/27/2019 15:55   Dg Chest Port 1 View  Result Date: 02/17/2019 CLINICAL DATA:  Per daughter patient has hx of dementia but has significant altered mental status in past 2 weeks. Patient is having auditory and visual hallucinations. Patient wandering away from home. Patient tried to  remove his clothes yesterday. No aggression. EXAM: PORTABLE CHEST 1 VIEW COMPARISON:  12/21/2009 FINDINGS: Stable changes from prior cardiac surgery. Cardiac silhouette mildly enlarged. No mediastinal or hilar masses. Mild opacity at the right lung base is similar to the prior study. There is vascular prominence, but no convincing pulmonary edema. No evidence of pneumonia. No pleural effusion or pneumothorax. Skeletal structures are grossly intact. IMPRESSION: No acute cardiopulmonary disease. Electronically Signed   By: Amie Portlandavid  Ormond M.D.   On: 02/17/2019 16:14   Micro Results   Recent Results (from the past 240 hour(s))  Urine culture     Status: None   Collection Time: 02/26/19 11:01 AM   Specimen: Urine, Catheterized  Result Value Ref Range Status   Specimen Description   Final    URINE, CATHETERIZED Performed at [facility], [address] [place], [place] [address]    Special Requests   Final    NONE Performed at [facility], [address] [place], [place] [address]    Culture   Final    NO GROWTH Performed at [facility], [address] [place], [place] [address]    Report Status 02/27/2019 FINAL  Final  SARS CORONAVIRUS 2 Nasal Swab Aptima Multi Swab     Status: None   Collection Time: 02/26/19  1:53 PM   Specimen: Aptima Multi Swab; Nasal Swab  Result Value Ref Range Status   SARS Coronavirus 2 NEGATIVE NEGATIVE Final    Comment: (NOTE) SARS-CoV-2 target nucleic acids are NOT DETECTED. The SARS-CoV-2 RNA is generally detectable in upper and lower respiratory specimens during the acute  phase of infection. Negative results do not preclude SARS-CoV-2 infection, do not rule out co-infections with other pathogens, and should not be used as the sole basis for treatment or other patient management decisions. Negative results must be combined with clinical observations, patient history, and epidemiological information. The expected result is Negative. Fact Sheet for Patients: HairSlick.nohttps://www.fda.gov/media/138098/download Fact Sheet for Healthcare Providers: quierodirigir.comhttps://www.fda.gov/media/138095/download This test is not yet approved or cleared by the [place] FDA and  has been authorized for detection and/or diagnosis of SARS-CoV-2 by FDA under an Emergency Use Authorization (EUA). This EUA will remain  in effect (meaning this test can be used) for the duration of the COVID-19 declaration under Section 56 4(b)(1) of the Act, 21 U.S.C. section 360bbb-3(b)(1), unless the authorization is terminated or revoked sooner. Performed at [facility], [address] [place], [place] [address]        Today   Subjective    Phillip ArntRufus Nolan today has no new complaints --          Patient has been seen and examined prior to discharge   Objective   Blood pressure (!) 100/58, pulse 79, temperature 98.6 F (37 C), resp. rate 18, height 5\' 11"  (1.803 m), weight 81.6 kg, SpO2 100 %.   Intake/Output Summary (Last 24 hours) at 02/28/2019 1059 Last data filed at 02/28/2019 0600 Gross per 24 hour  Intake 240 ml  Output --  Net 240 ml    Exam Gen:- Awake Alert, no acute distress  HEENT:- [place].AT, No sclera icterus Neck-Supple Neck,No JVD,.  Lungs-  CTAB , good air movement bilaterally  CV- S1, S2 normal, regular, prior sternotomy scar noted Abd-  +ve B.Sounds, Abd Soft, No tenderness,    Extremity/Skin:- No  edema,   good pulses Psych-As per daughter Phillip Nolan who is at bedside, patient's mentation is back to baseline at this  time -Patient with underlying cognitive  and memory deficits Neuro-generalized weakness, but no new focal deficits, no tremors    Data Review   CBC w Diff:  Lab Results  Component Value Date   WBC 7.2 02/28/2019   HGB 16.1 02/28/2019   HCT 51.1 02/28/2019   PLT 151 02/28/2019   LYMPHOPCT 26 02/27/2019   MONOPCT 8 02/27/2019   EOSPCT 4 02/27/2019   BASOPCT 1 02/27/2019    CMP:  Lab Results  Component Value Date   NA 138 02/28/2019   K 3.6 02/28/2019   CL 102 02/28/2019   CO2 24 02/28/2019   BUN 44 (H) 02/28/2019   CREATININE 1.04 02/28/2019   PROT 6.6 02/17/2019   ALBUMIN 3.7 02/17/2019   BILITOT 1.8 (H) 02/17/2019   ALKPHOS 65 02/17/2019   AST 30 02/17/2019   ALT 17 02/17/2019    Total Discharge time is about 33 minutes  Roxan Hockey M.D on 02/28/2019 at 10:59 AM  Go to [email or web address] -  for contact info  [facility] - Office  [phone]

## 2019-02-28 NOTE — Telephone Encounter (Signed)
I spoke to Phillip Nolan daughter Benjamine Mola and explained to her her father has an appointment scheduled with his Cardiologist 03/04/19 his cardiologist will keep up with his coumadin levels

## 2019-03-02 DIAGNOSIS — G9341 Metabolic encephalopathy: Secondary | ICD-10-CM | POA: Diagnosis not present

## 2019-03-02 DIAGNOSIS — Z5181 Encounter for therapeutic drug level monitoring: Secondary | ICD-10-CM | POA: Diagnosis not present

## 2019-03-02 DIAGNOSIS — I10 Essential (primary) hypertension: Secondary | ICD-10-CM | POA: Diagnosis not present

## 2019-03-02 DIAGNOSIS — I482 Chronic atrial fibrillation, unspecified: Secondary | ICD-10-CM | POA: Diagnosis not present

## 2019-03-02 DIAGNOSIS — Z7901 Long term (current) use of anticoagulants: Secondary | ICD-10-CM | POA: Diagnosis not present

## 2019-03-02 DIAGNOSIS — F0390 Unspecified dementia without behavioral disturbance: Secondary | ICD-10-CM | POA: Diagnosis not present

## 2019-03-04 ENCOUNTER — Ambulatory Visit (INDEPENDENT_AMBULATORY_CARE_PROVIDER_SITE_OTHER): Payer: Medicare Other | Admitting: Family Medicine

## 2019-03-04 ENCOUNTER — Telehealth: Payer: Self-pay | Admitting: Family Medicine

## 2019-03-04 ENCOUNTER — Encounter: Payer: Self-pay | Admitting: Family Medicine

## 2019-03-04 ENCOUNTER — Other Ambulatory Visit: Payer: Self-pay

## 2019-03-04 ENCOUNTER — Telehealth: Payer: Self-pay

## 2019-03-04 ENCOUNTER — Ambulatory Visit (INDEPENDENT_AMBULATORY_CARE_PROVIDER_SITE_OTHER): Payer: Medicare Other | Admitting: Pharmacist Clinician (PhC)/ Clinical Pharmacy Specialist

## 2019-03-04 VITALS — BP 122/80 | HR 79 | Temp 98.4°F | Ht 71.0 in | Wt 183.2 lb

## 2019-03-04 DIAGNOSIS — I482 Chronic atrial fibrillation, unspecified: Secondary | ICD-10-CM

## 2019-03-04 DIAGNOSIS — I10 Essential (primary) hypertension: Secondary | ICD-10-CM | POA: Diagnosis not present

## 2019-03-04 DIAGNOSIS — F0391 Unspecified dementia with behavioral disturbance: Secondary | ICD-10-CM

## 2019-03-04 DIAGNOSIS — R319 Hematuria, unspecified: Secondary | ICD-10-CM

## 2019-03-04 DIAGNOSIS — F0390 Unspecified dementia without behavioral disturbance: Secondary | ICD-10-CM | POA: Diagnosis not present

## 2019-03-04 DIAGNOSIS — N390 Urinary tract infection, site not specified: Secondary | ICD-10-CM

## 2019-03-04 DIAGNOSIS — Z7901 Long term (current) use of anticoagulants: Secondary | ICD-10-CM | POA: Diagnosis not present

## 2019-03-04 DIAGNOSIS — G9341 Metabolic encephalopathy: Secondary | ICD-10-CM | POA: Diagnosis not present

## 2019-03-04 DIAGNOSIS — Z5181 Encounter for therapeutic drug level monitoring: Secondary | ICD-10-CM

## 2019-03-04 DIAGNOSIS — R410 Disorientation, unspecified: Secondary | ICD-10-CM | POA: Diagnosis not present

## 2019-03-04 LAB — POCT INR: INR: 4.7 — AB (ref 2.0–3.0)

## 2019-03-04 NOTE — Telephone Encounter (Signed)
Larene Beach is calling from [facility] and requesting verbal orders for skilled nursing 2x a week for 2 weeks and then 1x a week for 6 weeks related to patients dementia and a fib.   CB# [phone]

## 2019-03-04 NOTE — Telephone Encounter (Signed)
Signed and faxed

## 2019-03-04 NOTE — Patient Instructions (Signed)
No warfarin Tuesday Aug 18 or Wednesday Aug 19.  Take only 1/2 tablet (2.5 mg) on Thursday Aug 20.  Repeat INR Friday August 21.  Do not give warfarin dose until after [facility] RN has checked INR and dosing has been verified by Dr. Myles Gip office.  Please have [facility] RN Verdis Frederickson) reach out to Tommy Medal PharmD at the [facility] office (at [place]) to get orders for first INR draw and info on where to call results.

## 2019-03-04 NOTE — Telephone Encounter (Signed)
Routing to Dr. Holly Bodily for correct Orders

## 2019-03-04 NOTE — Progress Notes (Signed)
New Patient Office Visit  Subjective:  Patient ID: Phillip Nolan, male    DOB: August 19, 1927  Age: 85 y.o. MRN: [id number]  CC: hospitalized for confusion and UTI, taking coumadin with abnormal PT/INR. HHC currently following pt for social work-possible placement , nursing for administration of medication and ongoing evaluation. Pt with new onset incontinence.  Pt with recent worsening of confusion.  HPI Phillip Nolan presents for hospital follow up Increase in INR-pt with Valve replacement -need for coumadin BPH-no medications, pt states NO pain with urination Right ear pain -concern for cerumen Increase in confusion-noted by daughter in law who lives with patient that he is starting to wander. Within the past month pt found away from property by neighbors.  Agitation at night -no aggression  Past Medical History:  Diagnosis Date  . Aortic valve disease    Status post AVR 2011  . Atrial fibrillation ([name])   . BPH (benign prostatic hyperplasia)   . CHF (congestive heart failure) ([name])   . Coronary atherosclerosis of native coronary artery    Nonobstructive  . Dementia ([name])   . Essential hypertension   . History of bacteremia    Streptococcus gordonii bacteremia 2/11 - no clear vegetation by TEE  . HOH (hard of hearing)   . Mitral valve disease    Status post annular repair 2011    Past Surgical History:  Procedure Laterality Date  . Median sterntomy  4/11   Dr. Roxy Manns - AVR (27 mm Edwards pericardial) and MV repair  . MULTIPLE TOOTH EXTRACTIONS  4/11   (pre-op above)    Family History  Family history unknown: Yes    Social History  Lives with daughter in law and grandson-son died , wife died Social work assisting with transportation-family has no transportation.  Daughter lives in [place] Socioeconomic History  . widowed     Spouse name: Not on file  . Number of children: Not on file  . Years of education: Not on file  . Highest education level: Not on file   Occupational History  . Not on file  Social Needs  . Financial resource strain: Not on file  . Food insecurity    Worry: Not on file    Inability: Not on file  . Transportation needs    Medical: Not on file    Non-medical: Not on file  Tobacco Use  . Smoking status: Former Smoker    Types: Pipe, Cigars    Quit date: 07/18/1978    Years since quitting: 40.6  . Smokeless tobacco: Never Used  Substance and Sexual Activity  . Alcohol use: No    Alcohol/week: 0.0 standard drinks  . Drug use: No  . Sexual activity: Not on file  Lifestyle  . Physical activity    Days per week: Not on file    Minutes per session: Not on file  . Stress: Not on file  Relationships  . Social Herbalist on phone: Not on file    Gets together: Not on file    Attends religious service: Not on file    Active member of club or organization: Not on file    Attends meetings of clubs or organizations: Not on file    Relationship status: Not on file  . Intimate partner violence    Fear of current or ex partner: Not on file    Emotionally abused: Not on file    Physically abused: Not on file  Forced sexual activity: Not on file  Other Topics Concern  . Not on file  Social History Narrative   Lives with daughter in law     ROS Review of Systems  Constitutional: Positive for activity change and fatigue. Negative for appetite change, fever and unexpected weight change.  HENT: Positive for ear pain. Negative for trouble swallowing.   Eyes: Negative for visual disturbance.  Respiratory: Negative for cough and shortness of breath.   Cardiovascular: Negative for chest pain, palpitations and leg swelling.  Gastrointestinal: Negative for abdominal pain.  Genitourinary: Negative for dysuria.  Musculoskeletal: Negative for arthralgias.  Skin: Positive for rash.  Neurological: Negative for dizziness.  Psychiatric/Behavioral: Positive for agitation, confusion and sleep disturbance. The patient is  nervous/anxious.   Wears readings glasses Does not wear hearing aids-severe hearing loss  Objective:   Today's Vitals: BP 122/80 (BP Location: Left Arm, Patient Position: Sitting, Cuff Size: Normal)   Pulse 79   Temp 98.4 F (36.9 C) (Oral)   Ht 5\' 11"  (1.803 m)   Wt 183 lb 3.2 oz (83.1 kg)   SpO2 94%   BMI 25.55 kg/m   Physical Exam Constitutional:      Appearance: Normal appearance. He is normal weight.  HENT:     Head: Normocephalic and atraumatic.     Right Ear: Tympanic membrane normal.     Left Ear: Tympanic membrane normal.     Nose: Nose normal.  Eyes:     Conjunctiva/sclera: Conjunctivae normal.  Neck:     Musculoskeletal: Normal range of motion and neck supple.  Cardiovascular:     Rate and Rhythm: Normal rate and regular rhythm.     Pulses: Normal pulses.     Heart sounds: Murmur present.  Pulmonary:     Effort: Pulmonary effort is normal.     Breath sounds: Normal breath sounds.  Skin:    Findings: Lesion present.  Neurological:     Mental Status: He is alert. He is disoriented.   cerumen cleared right ear with water and extraction Multiple AK noted on scalp, ears bilat Oriented to person only  Assessment & Plan:  1. Urinary tract infection with hematuria, site unspecified +blood and leukocytes - Urine Culture-pending No medication-took cipro with abnormal PT/INR  2. Confusion Concern for UTI and hospitalization Dementia work up with MRI/CT/labwork  3. Hematuria, unspecified type H/o BPH-culture pending-concern for urologic concern-+blood, +leukocytes  4. Dementia with behavioral disturbance, unspecified dementia type (HCC) D/w pt pts daughter for 45min concerns about pt safety-oriented to person-not place, or time. Pt knows daughter's name but otherwise does not know time, date, season, year. Pt knows current president.  Pt with difficulty at night with confusion. Pt living with daughter in law who cares for pt.  Daughter lives 3 hours away.  Son  and wife are both deceased. Grandson lives in home. D/w daughter concern about pt taking Xanax-controlled substance, sedation, addiction risk, pt unable to take medication himself. Daughter in law gives pt his coumadin  HHC to assess-pt on coumadin with current levels elevated-PT/INR monitoring at cardiology. Pt with valvular disease.   Outpatient Encounter Medications as of 03/04/2019  Medication Sig  . acetaminophen (TYLENOL) 325 MG tablet Take 2 tablets (650 mg total) by mouth every 4 (four) hours as needed for headache or mild pain.  Marland Kitchen. ALPRAZolam (XANAX) 0.25 MG tablet Take 1 tablet (0.25 mg total) by mouth 2 (two) times daily as needed for anxiety or sleep.  Marland Kitchen. lisinopril (ZESTRIL) 2.5 MG tablet  Take 1 tablet daily  . warfarin (COUMADIN) 5 MG tablet Take 1.5 tablets (7.5 mg total) by mouth every morning. TAKE 1&1/2 TABLETS DAILY or as directed   No facility-administered encounter medications on file as of 03/04/2019.     Follow-up: 1 month with daughter Concern for safety-d/w pt's daughter Concern for medication-xanax-recommended not giving this medication to pt No recent falls-daughter in law cooks , cleans and does laundry for pt.  Pt with self care of bathing. States grab bars in shower HHC following  CODE FOR TIME 1745min-50% of time used in counseling for dementia, confusion, UTI Venba Zenner Mat CarneLEIGH Casin Federici, MD

## 2019-03-04 NOTE — Telephone Encounter (Signed)
LeighAnn Rohil Lesch, CMA  

## 2019-03-05 ENCOUNTER — Telehealth: Payer: Self-pay | Admitting: Cardiology

## 2019-03-05 ENCOUNTER — Telehealth: Payer: Self-pay | Admitting: Family Medicine

## 2019-03-05 ENCOUNTER — Telehealth: Payer: Self-pay | Admitting: Pharmacist Clinician (PhC)/ Clinical Pharmacy Specialist

## 2019-03-05 DIAGNOSIS — Z5181 Encounter for therapeutic drug level monitoring: Secondary | ICD-10-CM | POA: Diagnosis not present

## 2019-03-05 DIAGNOSIS — Z7901 Long term (current) use of anticoagulants: Secondary | ICD-10-CM | POA: Diagnosis not present

## 2019-03-05 DIAGNOSIS — I10 Essential (primary) hypertension: Secondary | ICD-10-CM | POA: Diagnosis not present

## 2019-03-05 DIAGNOSIS — I482 Chronic atrial fibrillation, unspecified: Secondary | ICD-10-CM | POA: Diagnosis not present

## 2019-03-05 DIAGNOSIS — F0390 Unspecified dementia without behavioral disturbance: Secondary | ICD-10-CM | POA: Diagnosis not present

## 2019-03-05 DIAGNOSIS — G9341 Metabolic encephalopathy: Secondary | ICD-10-CM | POA: Diagnosis not present

## 2019-03-05 NOTE — Telephone Encounter (Signed)
Returned call to [place] with [facility] and gave her the instructions that are listed on Anticoagulation sheet from yesterday; which state to have pt hold dose on 8/18 and 8/19 dose then take 1/2 tablet on Thursday 8/20 and repeat INR on 03/08/2019 and call to Cyril Mourning PhD at [facility] office. Gave her the number [phone], to the Clinic to reach [place] PhD, as well, per instructions.

## 2019-03-05 NOTE — Telephone Encounter (Signed)
[place] - [phone]   Needs to get orders to continue checking PT/INR for patient's home.

## 2019-03-05 NOTE — Telephone Encounter (Signed)
Noted  

## 2019-03-05 NOTE — Telephone Encounter (Signed)
Routing to Dr. Corum 

## 2019-03-05 NOTE — Telephone Encounter (Signed)
Phillip Nolan is calling from [facility] requesting physical therapy orders with frequency 2x a week for 3 weeks, 1x a week for 2 weeks. Cb# [phone] okay to leave VM

## 2019-03-05 NOTE — Telephone Encounter (Signed)
Patient seen in [place] yesterday, will be getting home heath services starting this week.  Due to elevated INR needs to be rechecked either Thursday or Friday of this week.  Daughter was given our phone number to have [facility] call for orders to draw INR>  Daughter called today, states [facility] won't schedule appt for patient until they have orders for INR.  Returned call about 1 hour later, she states she believes the situation has been cleared and that they have orders for INR.  HH RN should be Verdis Frederickson or Janett Billow, but asked that we call La[facility] [place] at [phone] with orders.  Called the given number, LMOM for RN to return call should there be any concerns about drawing INR later this week (Thurs or Fri) and they should call results to [facility] office.

## 2019-03-06 DIAGNOSIS — I10 Essential (primary) hypertension: Secondary | ICD-10-CM | POA: Diagnosis not present

## 2019-03-06 DIAGNOSIS — Z7901 Long term (current) use of anticoagulants: Secondary | ICD-10-CM | POA: Diagnosis not present

## 2019-03-06 DIAGNOSIS — G9341 Metabolic encephalopathy: Secondary | ICD-10-CM | POA: Diagnosis not present

## 2019-03-06 DIAGNOSIS — I482 Chronic atrial fibrillation, unspecified: Secondary | ICD-10-CM | POA: Diagnosis not present

## 2019-03-06 DIAGNOSIS — F0390 Unspecified dementia without behavioral disturbance: Secondary | ICD-10-CM | POA: Diagnosis not present

## 2019-03-06 DIAGNOSIS — Z5181 Encounter for therapeutic drug level monitoring: Secondary | ICD-10-CM | POA: Diagnosis not present

## 2019-03-06 NOTE — Telephone Encounter (Signed)
I called Phillip Nolan back with [facility] left him a voicemail stating that those PT orders are fine with Dr. Holly Bodily and if we needed to sign please fax it over to [place] at [phone] or if he needs to call us back he can reach [place] at [phone]

## 2019-03-07 DIAGNOSIS — Z5181 Encounter for therapeutic drug level monitoring: Secondary | ICD-10-CM | POA: Diagnosis not present

## 2019-03-07 DIAGNOSIS — I10 Essential (primary) hypertension: Secondary | ICD-10-CM | POA: Diagnosis not present

## 2019-03-07 DIAGNOSIS — F0390 Unspecified dementia without behavioral disturbance: Secondary | ICD-10-CM | POA: Diagnosis not present

## 2019-03-07 DIAGNOSIS — Z7901 Long term (current) use of anticoagulants: Secondary | ICD-10-CM | POA: Diagnosis not present

## 2019-03-07 DIAGNOSIS — G9341 Metabolic encephalopathy: Secondary | ICD-10-CM | POA: Diagnosis not present

## 2019-03-07 DIAGNOSIS — I482 Chronic atrial fibrillation, unspecified: Secondary | ICD-10-CM | POA: Diagnosis not present

## 2019-03-08 ENCOUNTER — Telehealth: Payer: Self-pay | Admitting: Family Medicine

## 2019-03-08 ENCOUNTER — Ambulatory Visit (INDEPENDENT_AMBULATORY_CARE_PROVIDER_SITE_OTHER): Payer: Medicare Other | Admitting: Cardiology

## 2019-03-08 DIAGNOSIS — Z7901 Long term (current) use of anticoagulants: Secondary | ICD-10-CM | POA: Diagnosis not present

## 2019-03-08 DIAGNOSIS — F0390 Unspecified dementia without behavioral disturbance: Secondary | ICD-10-CM | POA: Diagnosis not present

## 2019-03-08 DIAGNOSIS — Z5181 Encounter for therapeutic drug level monitoring: Secondary | ICD-10-CM | POA: Diagnosis not present

## 2019-03-08 DIAGNOSIS — I482 Chronic atrial fibrillation, unspecified: Secondary | ICD-10-CM | POA: Diagnosis not present

## 2019-03-08 DIAGNOSIS — I10 Essential (primary) hypertension: Secondary | ICD-10-CM | POA: Diagnosis not present

## 2019-03-08 DIAGNOSIS — G9341 Metabolic encephalopathy: Secondary | ICD-10-CM | POA: Diagnosis not present

## 2019-03-08 LAB — POCT INR: INR: 1.5 — AB (ref 2.0–3.0)

## 2019-03-08 NOTE — Telephone Encounter (Signed)
I gave verbal permission for a speech therapy evaluation on Mr. Dada

## 2019-03-08 NOTE — Telephone Encounter (Signed)
Verdis Frederickson is calling from [facility] and states she is seeing the patient and the patient has dementia. The family is requesting a speech therapy evaluation. Verdis Frederickson can take a verbal at [phone].

## 2019-03-11 ENCOUNTER — Emergency Department (HOSPITAL_COMMUNITY): Payer: Medicare Other

## 2019-03-11 ENCOUNTER — Telehealth: Payer: Self-pay | Admitting: Family Medicine

## 2019-03-11 ENCOUNTER — Encounter (HOSPITAL_COMMUNITY): Payer: Self-pay | Admitting: Emergency Medicine

## 2019-03-11 ENCOUNTER — Other Ambulatory Visit: Payer: Self-pay

## 2019-03-11 ENCOUNTER — Inpatient Hospital Stay (HOSPITAL_COMMUNITY)
Admission: EM | Admit: 2019-03-11 | Discharge: 2019-03-20 | DRG: 853 | Disposition: A | Discharge status: Skilled Nursing Facility | Payer: Medicare Other | Attending: Internal Medicine | Admitting: Internal Medicine

## 2019-03-11 DIAGNOSIS — Z7901 Long term (current) use of anticoagulants: Secondary | ICD-10-CM | POA: Diagnosis not present

## 2019-03-11 DIAGNOSIS — S72092A Other fracture of head and neck of left femur, initial encounter for closed fracture: Secondary | ICD-10-CM | POA: Diagnosis not present

## 2019-03-11 DIAGNOSIS — Z9181 History of falling: Secondary | ICD-10-CM | POA: Diagnosis not present

## 2019-03-11 DIAGNOSIS — S72002A Fracture of unspecified part of neck of left femur, initial encounter for closed fracture: Secondary | ICD-10-CM | POA: Diagnosis not present

## 2019-03-11 DIAGNOSIS — I469 Cardiac arrest, cause unspecified: Secondary | ICD-10-CM | POA: Diagnosis not present

## 2019-03-11 DIAGNOSIS — Z741 Need for assistance with personal care: Secondary | ICD-10-CM | POA: Diagnosis not present

## 2019-03-11 DIAGNOSIS — I4510 Unspecified right bundle-branch block: Secondary | ICD-10-CM | POA: Diagnosis present

## 2019-03-11 DIAGNOSIS — Z9889 Other specified postprocedural states: Secondary | ICD-10-CM | POA: Diagnosis not present

## 2019-03-11 DIAGNOSIS — R471 Dysarthria and anarthria: Secondary | ICD-10-CM | POA: Diagnosis not present

## 2019-03-11 DIAGNOSIS — Z87891 Personal history of nicotine dependence: Secondary | ICD-10-CM | POA: Diagnosis not present

## 2019-03-11 DIAGNOSIS — J9601 Acute respiratory failure with hypoxia: Secondary | ICD-10-CM | POA: Diagnosis not present

## 2019-03-11 DIAGNOSIS — Z4789 Encounter for other orthopedic aftercare: Secondary | ICD-10-CM | POA: Diagnosis not present

## 2019-03-11 DIAGNOSIS — Z515 Encounter for palliative care: Secondary | ICD-10-CM | POA: Diagnosis not present

## 2019-03-11 DIAGNOSIS — Z79899 Other long term (current) drug therapy: Secondary | ICD-10-CM | POA: Diagnosis not present

## 2019-03-11 DIAGNOSIS — J181 Lobar pneumonia, unspecified organism: Secondary | ICD-10-CM | POA: Diagnosis present

## 2019-03-11 DIAGNOSIS — I959 Hypotension, unspecified: Secondary | ICD-10-CM | POA: Diagnosis not present

## 2019-03-11 DIAGNOSIS — A419 Sepsis, unspecified organism: Principal | ICD-10-CM

## 2019-03-11 DIAGNOSIS — Z5181 Encounter for therapeutic drug level monitoring: Secondary | ICD-10-CM | POA: Diagnosis not present

## 2019-03-11 DIAGNOSIS — I4891 Unspecified atrial fibrillation: Secondary | ICD-10-CM | POA: Diagnosis not present

## 2019-03-11 DIAGNOSIS — Z7189 Other specified counseling: Secondary | ICD-10-CM

## 2019-03-11 DIAGNOSIS — E860 Dehydration: Secondary | ICD-10-CM | POA: Diagnosis present

## 2019-03-11 DIAGNOSIS — R0902 Hypoxemia: Secondary | ICD-10-CM | POA: Diagnosis not present

## 2019-03-11 DIAGNOSIS — D689 Coagulation defect, unspecified: Secondary | ICD-10-CM | POA: Diagnosis not present

## 2019-03-11 DIAGNOSIS — D649 Anemia, unspecified: Secondary | ICD-10-CM | POA: Diagnosis not present

## 2019-03-11 DIAGNOSIS — D696 Thrombocytopenia, unspecified: Secondary | ICD-10-CM | POA: Diagnosis present

## 2019-03-11 DIAGNOSIS — Z419 Encounter for procedure for purposes other than remedying health state, unspecified: Secondary | ICD-10-CM

## 2019-03-11 DIAGNOSIS — F0390 Unspecified dementia without behavioral disturbance: Secondary | ICD-10-CM | POA: Diagnosis not present

## 2019-03-11 DIAGNOSIS — I509 Heart failure, unspecified: Secondary | ICD-10-CM | POA: Diagnosis present

## 2019-03-11 DIAGNOSIS — R05 Cough: Secondary | ICD-10-CM | POA: Diagnosis not present

## 2019-03-11 DIAGNOSIS — F0391 Unspecified dementia with behavioral disturbance: Secondary | ICD-10-CM | POA: Diagnosis not present

## 2019-03-11 DIAGNOSIS — H9190 Unspecified hearing loss, unspecified ear: Secondary | ICD-10-CM | POA: Diagnosis present

## 2019-03-11 DIAGNOSIS — L89159 Pressure ulcer of sacral region, unspecified stage: Secondary | ICD-10-CM | POA: Diagnosis not present

## 2019-03-11 DIAGNOSIS — I4821 Permanent atrial fibrillation: Secondary | ICD-10-CM | POA: Diagnosis present

## 2019-03-11 DIAGNOSIS — R262 Difficulty in walking, not elsewhere classified: Secondary | ICD-10-CM | POA: Diagnosis not present

## 2019-03-11 DIAGNOSIS — I110 Hypertensive heart disease with heart failure: Secondary | ICD-10-CM | POA: Diagnosis present

## 2019-03-11 DIAGNOSIS — E669 Obesity, unspecified: Secondary | ICD-10-CM | POA: Diagnosis not present

## 2019-03-11 DIAGNOSIS — Y95 Nosocomial condition: Secondary | ICD-10-CM | POA: Diagnosis present

## 2019-03-11 DIAGNOSIS — R52 Pain, unspecified: Secondary | ICD-10-CM | POA: Diagnosis not present

## 2019-03-11 DIAGNOSIS — I080 Rheumatic disorders of both mitral and aortic valves: Secondary | ICD-10-CM | POA: Diagnosis not present

## 2019-03-11 DIAGNOSIS — I480 Paroxysmal atrial fibrillation: Secondary | ICD-10-CM | POA: Diagnosis not present

## 2019-03-11 DIAGNOSIS — R6521 Severe sepsis with septic shock: Secondary | ICD-10-CM | POA: Diagnosis not present

## 2019-03-11 DIAGNOSIS — G3109 Other frontotemporal dementia: Secondary | ICD-10-CM | POA: Diagnosis not present

## 2019-03-11 DIAGNOSIS — Y92009 Unspecified place in unspecified non-institutional (private) residence as the place of occurrence of the external cause: Secondary | ICD-10-CM

## 2019-03-11 DIAGNOSIS — Z743 Need for continuous supervision: Secondary | ICD-10-CM | POA: Diagnosis not present

## 2019-03-11 DIAGNOSIS — Z881 Allergy status to other antibiotic agents status: Secondary | ICD-10-CM

## 2019-03-11 DIAGNOSIS — I2510 Atherosclerotic heart disease of native coronary artery without angina pectoris: Secondary | ICD-10-CM | POA: Diagnosis present

## 2019-03-11 DIAGNOSIS — F0281 Dementia in other diseases classified elsewhere with behavioral disturbance: Secondary | ICD-10-CM | POA: Diagnosis not present

## 2019-03-11 DIAGNOSIS — L89624 Pressure ulcer of left heel, stage 4: Secondary | ICD-10-CM | POA: Diagnosis not present

## 2019-03-11 DIAGNOSIS — R41841 Cognitive communication deficit: Secondary | ICD-10-CM | POA: Diagnosis not present

## 2019-03-11 DIAGNOSIS — J189 Pneumonia, unspecified organism: Secondary | ICD-10-CM

## 2019-03-11 DIAGNOSIS — Z20828 Contact with and (suspected) exposure to other viral communicable diseases: Secondary | ICD-10-CM | POA: Diagnosis present

## 2019-03-11 DIAGNOSIS — S199XXA Unspecified injury of neck, initial encounter: Secondary | ICD-10-CM | POA: Diagnosis not present

## 2019-03-11 DIAGNOSIS — N401 Enlarged prostate with lower urinary tract symptoms: Secondary | ICD-10-CM | POA: Diagnosis not present

## 2019-03-11 DIAGNOSIS — S72142A Displaced intertrochanteric fracture of left femur, initial encounter for closed fracture: Secondary | ICD-10-CM

## 2019-03-11 DIAGNOSIS — Z880 Allergy status to penicillin: Secondary | ICD-10-CM | POA: Diagnosis not present

## 2019-03-11 DIAGNOSIS — M47812 Spondylosis without myelopathy or radiculopathy, cervical region: Secondary | ICD-10-CM | POA: Diagnosis present

## 2019-03-11 DIAGNOSIS — W1830XA Fall on same level, unspecified, initial encounter: Secondary | ICD-10-CM | POA: Diagnosis present

## 2019-03-11 DIAGNOSIS — S72142D Displaced intertrochanteric fracture of left femur, subsequent encounter for closed fracture with routine healing: Secondary | ICD-10-CM | POA: Diagnosis not present

## 2019-03-11 DIAGNOSIS — S0990XA Unspecified injury of head, initial encounter: Secondary | ICD-10-CM | POA: Diagnosis not present

## 2019-03-11 DIAGNOSIS — I482 Chronic atrial fibrillation, unspecified: Secondary | ICD-10-CM | POA: Diagnosis present

## 2019-03-11 DIAGNOSIS — W19XXXA Unspecified fall, initial encounter: Secondary | ICD-10-CM | POA: Diagnosis not present

## 2019-03-11 DIAGNOSIS — N179 Acute kidney failure, unspecified: Secondary | ICD-10-CM | POA: Diagnosis not present

## 2019-03-11 DIAGNOSIS — N400 Enlarged prostate without lower urinary tract symptoms: Secondary | ICD-10-CM | POA: Diagnosis present

## 2019-03-11 DIAGNOSIS — L8990 Pressure ulcer of unspecified site, unspecified stage: Secondary | ICD-10-CM | POA: Insufficient documentation

## 2019-03-11 DIAGNOSIS — I4819 Other persistent atrial fibrillation: Secondary | ICD-10-CM | POA: Diagnosis not present

## 2019-03-11 DIAGNOSIS — S9032XA Contusion of left foot, initial encounter: Secondary | ICD-10-CM

## 2019-03-11 DIAGNOSIS — S72002D Fracture of unspecified part of neck of left femur, subsequent encounter for closed fracture with routine healing: Secondary | ICD-10-CM | POA: Diagnosis not present

## 2019-03-11 DIAGNOSIS — R279 Unspecified lack of coordination: Secondary | ICD-10-CM | POA: Diagnosis not present

## 2019-03-11 DIAGNOSIS — R0689 Other abnormalities of breathing: Secondary | ICD-10-CM | POA: Diagnosis not present

## 2019-03-11 DIAGNOSIS — N390 Urinary tract infection, site not specified: Secondary | ICD-10-CM

## 2019-03-11 DIAGNOSIS — S299XXA Unspecified injury of thorax, initial encounter: Secondary | ICD-10-CM | POA: Diagnosis not present

## 2019-03-11 DIAGNOSIS — M6281 Muscle weakness (generalized): Secondary | ICD-10-CM | POA: Diagnosis not present

## 2019-03-11 DIAGNOSIS — R410 Disorientation, unspecified: Secondary | ICD-10-CM | POA: Diagnosis not present

## 2019-03-11 DIAGNOSIS — R059 Cough, unspecified: Secondary | ICD-10-CM

## 2019-03-11 DIAGNOSIS — S72002K Fracture of unspecified part of neck of left femur, subsequent encounter for closed fracture with nonunion: Secondary | ICD-10-CM | POA: Diagnosis not present

## 2019-03-11 DIAGNOSIS — Z952 Presence of prosthetic heart valve: Secondary | ICD-10-CM | POA: Diagnosis not present

## 2019-03-11 DIAGNOSIS — G9341 Metabolic encephalopathy: Secondary | ICD-10-CM | POA: Diagnosis not present

## 2019-03-11 DIAGNOSIS — R1312 Dysphagia, oropharyngeal phase: Secondary | ICD-10-CM | POA: Diagnosis not present

## 2019-03-11 DIAGNOSIS — Z66 Do not resuscitate: Secondary | ICD-10-CM | POA: Diagnosis not present

## 2019-03-11 DIAGNOSIS — Z01818 Encounter for other preprocedural examination: Secondary | ICD-10-CM | POA: Diagnosis not present

## 2019-03-11 DIAGNOSIS — I38 Endocarditis, valve unspecified: Secondary | ICD-10-CM | POA: Diagnosis not present

## 2019-03-11 DIAGNOSIS — I10 Essential (primary) hypertension: Secondary | ICD-10-CM | POA: Diagnosis present

## 2019-03-11 DIAGNOSIS — L89620 Pressure ulcer of left heel, unstageable: Secondary | ICD-10-CM | POA: Diagnosis present

## 2019-03-11 DIAGNOSIS — S99922A Unspecified injury of left foot, initial encounter: Secondary | ICD-10-CM | POA: Diagnosis not present

## 2019-03-11 DIAGNOSIS — Z953 Presence of xenogenic heart valve: Secondary | ICD-10-CM

## 2019-03-11 LAB — URINALYSIS, ROUTINE W REFLEX MICROSCOPIC
Bacteria, UA: NONE SEEN
Bilirubin Urine: NEGATIVE
Glucose, UA: NEGATIVE mg/dL
Ketones, ur: NEGATIVE mg/dL
Nitrite: NEGATIVE
Protein, ur: 30 mg/dL — AB
RBC / HPF: >50 RBC/hpf — ABNORMAL HIGH (ref 0–5)
Specific Gravity, Urine: 1.017 (ref 1.005–1.030)
WBC, UA: >50 WBC/hpf — ABNORMAL HIGH (ref 0–5)
pH: 5 (ref 5.0–8.0)

## 2019-03-11 LAB — CBC WITH DIFFERENTIAL/PLATELET
Abs Immature Granulocytes: 0.02 10*3/uL (ref 0.00–0.07)
Basophils Absolute: 0 10*3/uL (ref 0.0–0.1)
Basophils Relative: 0 %
Eosinophils Absolute: 0 10*3/uL (ref 0.0–0.5)
Eosinophils Relative: 0 %
HCT: 46.7 % (ref 39.0–52.0)
Hemoglobin: 15.2 g/dL (ref 13.0–17.0)
Immature Granulocytes: 0 %
Lymphocytes Relative: 4 %
Lymphs Abs: 0.3 10*3/uL — ABNORMAL LOW (ref 0.7–4.0)
MCH: 30.5 pg (ref 26.0–34.0)
MCHC: 32.5 g/dL (ref 30.0–36.0)
MCV: 93.8 fL (ref 80.0–100.0)
Monocytes Absolute: 1 10*3/uL (ref 0.1–1.0)
Monocytes Relative: 11 %
Neutro Abs: 8 10*3/uL — ABNORMAL HIGH (ref 1.7–7.7)
Neutrophils Relative %: 85 %
Platelets: 143 10*3/uL — ABNORMAL LOW (ref 150–400)
RBC: 4.98 MIL/uL (ref 4.22–5.81)
RDW: 13 % (ref 11.5–15.5)
WBC: 9.4 10*3/uL (ref 4.0–10.5)
nRBC: 0 % (ref 0.0–0.2)

## 2019-03-11 LAB — SARS CORONAVIRUS 2 BY RT PCR (HOSPITAL ORDER, PERFORMED IN ~~LOC~~ HOSPITAL LAB): SARS Coronavirus 2: NEGATIVE

## 2019-03-11 LAB — COMPREHENSIVE METABOLIC PANEL
ALT: 24 U/L (ref 0–44)
AST: 38 U/L (ref 15–41)
Albumin: 3.2 g/dL — ABNORMAL LOW (ref 3.5–5.0)
Alkaline Phosphatase: 60 U/L (ref 38–126)
Anion gap: 10 (ref 5–15)
BUN: 74 mg/dL — ABNORMAL HIGH (ref 8–23)
CO2: 22 mmol/L (ref 22–32)
Calcium: 9 mg/dL (ref 8.9–10.3)
Chloride: 105 mmol/L (ref 98–111)
Creatinine, Ser: 1.52 mg/dL — ABNORMAL HIGH (ref 0.61–1.24)
GFR calc Af Amer: 46 mL/min — ABNORMAL LOW (ref >60)
GFR calc non Af Amer: 40 mL/min — ABNORMAL LOW (ref >60)
Glucose, Bld: 172 mg/dL — ABNORMAL HIGH (ref 70–99)
Potassium: 4.1 mmol/L (ref 3.5–5.1)
Sodium: 137 mmol/L (ref 135–145)
Total Bilirubin: 3 mg/dL — ABNORMAL HIGH (ref 0.3–1.2)
Total Protein: 6.8 g/dL (ref 6.5–8.1)

## 2019-03-11 LAB — APTT: aPTT: 40 seconds — ABNORMAL HIGH (ref 24–36)

## 2019-03-11 LAB — TROPONIN I (HIGH SENSITIVITY): Troponin I (High Sensitivity): 43 ng/L — ABNORMAL HIGH (ref <18)

## 2019-03-11 LAB — LACTIC ACID, PLASMA
Lactic Acid, Venous: 3.3 mmol/L (ref 0.5–1.9)
Lactic Acid, Venous: 2.1 mmol/L (ref 0.5–1.9)

## 2019-03-11 LAB — PROTIME-INR
INR: 6.1 (ref 0.8–1.2)
Prothrombin Time: 53.5 seconds — ABNORMAL HIGH (ref 11.4–15.2)

## 2019-03-11 MED ORDER — SODIUM CHLORIDE 0.9 % IV BOLUS (SEPSIS)
500.0000 mL | Freq: Once | INTRAVENOUS | Status: AC
  Administered 2019-03-11: 18:00:00 500 mL via INTRAVENOUS

## 2019-03-11 MED ORDER — SODIUM CHLORIDE 0.9 % IV BOLUS (SEPSIS)
1000.0000 mL | Freq: Once | INTRAVENOUS | Status: AC
  Administered 2019-03-11 (×2): 1000 mL via INTRAVENOUS

## 2019-03-11 MED ORDER — ACETAMINOPHEN 325 MG PO TABS
650.0000 mg | ORAL_TABLET | Freq: Four times a day (QID) | ORAL | Status: DC | PRN
  Administered 2019-03-13: 20:00:00 650 mg via ORAL
  Filled 2019-03-11: qty 2

## 2019-03-11 MED ORDER — SODIUM CHLORIDE 0.9 % IV SOLN
1000.0000 mL | INTRAVENOUS | Status: DC
  Administered 2019-03-11 – 2019-03-12 (×2): 1000 mL via INTRAVENOUS

## 2019-03-11 MED ORDER — SODIUM CHLORIDE 0.9 % IV SOLN
2.0000 g | Freq: Once | INTRAVENOUS | Status: AC
  Administered 2019-03-11: 18:00:00 2 g via INTRAVENOUS
  Filled 2019-03-11: qty 2

## 2019-03-11 MED ORDER — SODIUM CHLORIDE 0.9 % IV BOLUS (SEPSIS)
1000.0000 mL | Freq: Once | INTRAVENOUS | Status: AC
  Administered 2019-03-11: 19:00:00 1000 mL via INTRAVENOUS

## 2019-03-11 MED ORDER — ACETAMINOPHEN 650 MG RE SUPP
650.0000 mg | Freq: Once | RECTAL | Status: AC
  Administered 2019-03-11: 650 mg via RECTAL
  Filled 2019-03-11: qty 1

## 2019-03-11 MED ORDER — SODIUM CHLORIDE 0.9 % IV SOLN
2.0000 g | Freq: Two times a day (BID) | INTRAVENOUS | Status: DC
  Administered 2019-03-12 – 2019-03-13 (×3): 2 g via INTRAVENOUS
  Filled 2019-03-11 (×3): qty 2

## 2019-03-11 MED ORDER — METRONIDAZOLE IN NACL 5-0.79 MG/ML-% IV SOLN
500.0000 mg | Freq: Once | INTRAVENOUS | Status: AC
  Administered 2019-03-11: 20:00:00 500 mg via INTRAVENOUS
  Filled 2019-03-11: qty 100

## 2019-03-11 MED ORDER — VANCOMYCIN HCL IN DEXTROSE 1-5 GM/200ML-% IV SOLN
1000.0000 mg | Freq: Once | INTRAVENOUS | Status: AC
  Administered 2019-03-11: 20:00:00 1000 mg via INTRAVENOUS
  Filled 2019-03-11: qty 200

## 2019-03-11 MED ORDER — ACETAMINOPHEN 325 MG PO TABS: 650.0000 mg | ORAL_TABLET | Freq: Once | ORAL | Status: DC

## 2019-03-11 MED ORDER — VANCOMYCIN HCL IN DEXTROSE 750-5 MG/150ML-% IV SOLN
750.0000 mg | Freq: Two times a day (BID) | INTRAVENOUS | Status: DC
  Administered 2019-03-12: 01:00:00 750 mg via INTRAVENOUS
  Filled 2019-03-11: qty 150

## 2019-03-11 MED ORDER — ACETAMINOPHEN 650 MG RE SUPP: 650.0000 mg | Freq: Four times a day (QID) | RECTAL | Status: DC | PRN

## 2019-03-11 NOTE — Consult Note (Signed)
HOSPITAL CONSULT  [facility]    Patient ID: Phillip Nolan, male   DOB: Dec 31, 1927, 85 y.o.   MRN: [id number]  New patient  Requested by:  Reason for:   Based on the information below I recommend  Chief Complaint  Patient presents with  . Shortness of Breath     Phillip Nolan is a 85 y.o. male.    HPI   Location Duration Severity Quality Modified by  Review of Systems (all) ROS  Past Medical History:  Diagnosis Date  . Aortic valve disease    Status post AVR 2011  . Atrial fibrillation (HCC)   . BPH (benign prostatic hyperplasia)   . CHF (congestive heart failure) (HCC)   . Coronary atherosclerosis of native coronary artery    Nonobstructive  . Dementia (HCC)   . Essential hypertension   . History of bacteremia    Streptococcus gordonii bacteremia 2/11 - no clear vegetation by TEE  . HOH (hard of hearing)   . Mitral valve disease    Status post annular repair 2011    Past Surgical History:  Procedure Laterality Date  . Median sterntomy  4/11   Dr. Cornelius Moraswen - AVR (27 mm Edwards pericardial) and MV repair  . MULTIPLE TOOTH EXTRACTIONS  4/11   (pre-op above)    Family History  Family history unknown: Yes   Social History Social History   Tobacco Use  . Smoking status: Former Smoker    Types: Pipe, Cigars    Quit date: 07/18/1978    Years since quitting: 40.6  . Smokeless tobacco: Never Used  Substance Use Topics  . Alcohol use: No    Alcohol/week: 0.0 standard drinks  . Drug use: No     Allergies  Allergen Reactions  . Ciprofloxacin     Daughter reports caused more confusion, and INR to be abnormal  . Penicillins Rash    .Did it involve swelling of the face/tongue/throat, SOB, or low BP? {No Did it involve sudden or severe rash/hives, skin peeling, or any reaction on the inside of your mouth or nose? Yes Did you need to seek medical attention at a hospital or doctor's office? Unknown When did it last  happen?Childhood If all above answers are "NO", may proceed with cephalosporin use.      Current Facility-Administered Medications  Medication Dose Route Frequency Provider Last Rate Last Dose  . 0.9 %  sodium chloride infusion  1,000 mL Intravenous Continuous Emi HolesLaw, Alexandra M, PA-C 125 mL/hr at 03/11/19 1823 1,000 mL at 03/11/19 1823  . acetaminophen (TYLENOL) tablet 650 mg  650 mg Oral Q6H PRN Pearson GrippeKim, James, MD       Or  . acetaminophen (TYLENOL) suppository 650 mg  650 mg Rectal Q6H PRN Pearson GrippeKim, James, MD      . Melene Muller[START ON 03/12/2019] ceFEPIme (MAXIPIME) 2 g in sodium chloride 0.9 % 100 mL IVPB  2 g Intravenous Q12H Coffee, Gerre PebblesGarrett, [facility]      . [START ON 03/12/2019] vancomycin (VANCOCIN) IVPB 750 mg/150 ml premix  750 mg Intravenous Q12H Coffee, Gerre PebblesGarrett, [facility]       Current Outpatient Medications  Medication Sig Dispense Refill  . acetaminophen (TYLENOL) 325 MG tablet Take 2 tablets (650 mg total) by mouth every 4 (four) hours as needed for headache or mild pain. 12 tablet 1  . lisinopril (ZESTRIL) 2.5 MG tablet Take 1 tablet daily (Patient taking differently: Take 2.5 mg by mouth daily. ) 30 tablet 2  . warfarin (COUMADIN)  5 MG tablet Take 1.5 tablets (7.5 mg total) by mouth every morning. TAKE 1&1/2 TABLETS DAILY or as directed (Patient taking differently: Take 7.5 mg by mouth every evening. ) 45 tablet 1     Physical Exam(=30) BP 94/62   Pulse 83   Temp 98.4 F (36.9 C) (Oral)   Resp (!) 21   Ht 6' (1.829 m)   Wt 79.4 kg   SpO2 95%   BMI 23.73 kg/m   Gen. Appearance Peripheral vascular system  Lymph nodes  Gait   Left Upper extremity  Inspection revealed no malalignment or asymmetry  Assessment of range of motion: Full range of motion was recorded  Assessment of stability: Elbow wrist and hand and shoulder were stable  Assessment of muscle strength and tone revealed grade 5 muscle strength and normal muscle tone  Skin was normal without rash lesion or ulceration  Right  upper extremity  Inspection revealed no malalignment or asymmetry  Assessment of range of motion: Full range of motion was recorded  Assessment of stability: Elbow wrist and hand and shoulder were stable  Assessment of muscle strength and tone revealed grade 5 muscle strength and normal muscle tone  Skin was normal without rash lesion or ulceration  Right Lower extremity  Inspection revealed no malalignment or asymmetry  Assessment of range of motion: Full range of motion was recorded  Assessment of stability: Ankle, knee and hip were stable  Assessment of muscle strength and tone revealed grade 5 muscle strength and normal muscle tone  Skin was normal without rash lesion or ulceration  Left lower extremity Inspection revealed no malalignment or asymmetry  Assessment of range of motion: Full range of motion was recorded  Assessment of stability: Ankle, knee and hip were stable  Assessment of muscle strength and tone revealed grade 5 muscle strength and normal muscle tone  Skin was normal without rash lesion or ulceration  Coordination was tested by finger-to-nose nose and was normal Deep tendon reflexes were 2+ in the upper extremities and 2+ in the lower extremities Examination of sensation by touch was normal  Mental status  Oriented to time person and place normal  Mood and affect normal without depression anxiety or agitation  Dx:   Data Reviewed  ER RECORD REVIEWED: CONFIRMS HISTORY   I reviewed the following images and the reports and my independent interpretation is    Assessment    Plan      Phillip Civil MD

## 2019-03-11 NOTE — ED Notes (Signed)
ED TO INPATIENT HANDOFF REPORT  ED Nurse Name and Phone #: [phone]  S Name/Age/Gender Phillip Nolan 85 y.o. male Room/Bed: APA01/APA01  Code Status   Code Status: Full Code  Home/SNF/Other Home Patient oriented to: self Is this baseline? Yes   Triage Complete: Triage complete  Chief Complaint Immortability  Triage Note Recent fall and recent UTI.  EMS called out Physical Therapist.  Pt in urine and PT not about to get pt to walk.   EMS reports Pt Sats 82% RA and placed on O2@ 6L/m, increased to 88% on O2..  Pt in A-fib and cbg 165.  Pt denies pain.  Pt smells of urine and clothing is wet.     Allergies Allergies  Allergen Reactions  . Penicillins Rash    .Did it involve swelling of the face/tongue/throat, SOB, or low BP? {No Did it involve sudden or severe rash/hives, skin peeling, or any reaction on the inside of your mouth or nose? Yes Did you need to seek medical attention at a hospital or doctor's office? Unknown When did it last happen?Childhood If all above answers are "NO", may proceed with cephalosporin use.      Level of Care/Admitting Diagnosis ED Disposition    ED Disposition Condition Comment   Admit  Hospital Area: [facility] [[id number]]  Level of Care: Stepdown [14]  Covid Evaluation: Confirmed COVID Negative  Diagnosis: Sepsis [facility]) [[id number]]  Admitting Physician: Pearson Grippe [[id number]]  Attending Physician: Pearson Grippe [phone]  Estimated length of stay: past midnight tomorrow  Certification:: I certify this patient will need inpatient services for at least 2 midnights  PT Class (Do Not Modify): Inpatient [101]  PT Acc Code (Do Not Modify): Private [1]       B Medical/Surgery History Past Medical History:  Diagnosis Date  . Aortic valve disease    Status post AVR 2011  . Atrial fibrillation (HCC)   . BPH (benign prostatic hyperplasia)   . CHF (congestive heart failure) (HCC)   . Coronary atherosclerosis of native  coronary artery    Nonobstructive  . Dementia (HCC)   . Essential hypertension   . History of bacteremia    Streptococcus gordonii bacteremia 2/11 - no clear vegetation by TEE  . HOH (hard of hearing)   . Mitral valve disease    Status post annular repair 2011   Past Surgical History:  Procedure Laterality Date  . Median sterntomy  4/11   Dr. Cornelius Moras - AVR (27 mm Edwards pericardial) and MV repair  . MULTIPLE TOOTH EXTRACTIONS  4/11   (pre-op above)     A IV Location/Drains/Wounds Patient Lines/Drains/Airways Status   Active Line/Drains/Airways    Name:   Placement date:   Placement time:   Site:   Days:   Peripheral IV 03/11/19 Left Antecubital   03/11/19    1653    Antecubital   less than 1   Peripheral IV 03/11/19 Left;Lateral Wrist   03/11/19    2007    Wrist   less than 1          Intake/Output Last 24 hours  Intake/Output Summary (Last 24 hours) at 03/11/2019 2122 Last data filed at 03/11/2019 1859 Gross per 24 hour  Intake 500 ml  Output -  Net 500 ml    Labs/Imaging Results for orders placed or performed during the hospital encounter of 03/11/19 (from the past 48 hour(s))  Urinalysis, Routine w reflex microscopic     Status: Abnormal   Collection  Time: 03/11/19  5:33 PM  Result Value Ref Range   Color, Urine AMBER (A) YELLOW    Comment: BIOCHEMICALS MAY BE AFFECTED BY COLOR   APPearance HAZY (A) CLEAR   Specific Gravity, Urine 1.017 1.005 - 1.030   pH 5.0 5.0 - 8.0   Glucose, UA NEGATIVE NEGATIVE mg/dL   Hgb urine dipstick MODERATE (A) NEGATIVE   Bilirubin Urine NEGATIVE NEGATIVE   Ketones, ur NEGATIVE NEGATIVE mg/dL   Protein, ur 30 (A) NEGATIVE mg/dL   Nitrite NEGATIVE NEGATIVE   Leukocytes,Ua MODERATE (A) NEGATIVE   RBC / HPF >50 (H) 0 - 5 RBC/hpf   WBC, UA >50 (H) 0 - 5 WBC/hpf   Bacteria, UA NONE SEEN NONE SEEN   WBC Clumps PRESENT    Mucus PRESENT    Hyaline Casts, UA PRESENT     Comment: Performed at [facility], [address]  [place], [place] [id number]  SARS Coronavirus 2 [facility](Hospital order, Performed in [facility] hospital lab) Nasopharyngeal Nasopharyngeal Swab     Status: None   Collection Time: 03/11/19  5:36 PM   Specimen: Nasopharyngeal Swab  Result Value Ref Range   SARS Coronavirus 2 NEGATIVE NEGATIVE    Comment: (NOTE) If result is NEGATIVE SARS-CoV-2 target nucleic acids are NOT DETECTED. The SARS-CoV-2 RNA is generally detectable in upper and lower  respiratory specimens during the acute phase of infection. The lowest  concentration of SARS-CoV-2 viral copies this assay can detect is 250  copies / mL. A negative result does not preclude SARS-CoV-2 infection  and should not be used as the sole basis for treatment or other  patient management decisions.  A negative result may occur with  improper specimen collection / handling, submission of specimen other  than nasopharyngeal swab, presence of viral mutation(s) within the  areas targeted by this assay, and inadequate number of viral copies  (<250 copies / mL). A negative result must be combined with clinical  observations, patient history, and epidemiological information. If result is POSITIVE SARS-CoV-2 target nucleic acids are DETECTED. The SARS-CoV-2 RNA is generally detectable in upper and lower  respiratory specimens dur ing the acute phase of infection.  Positive  results are indicative of active infection with SARS-CoV-2.  Clinical  correlation with patient history and other diagnostic information is  necessary to determine patient infection status.  Positive results do  not rule out bacterial infection or co-infection with other viruses. If result is PRESUMPTIVE POSTIVE SARS-CoV-2 nucleic acids MAY BE PRESENT.   A presumptive positive result was obtained on the submitted specimen  and confirmed on repeat testing.  While 2019 novel coronavirus  (SARS-CoV-2) nucleic acids may be present in the submitted sample  additional confirmatory testing may  be necessary for epidemiological  and / or clinical management purposes  to differentiate between  SARS-CoV-2 and other Sarbecovirus currently known to infect humans.  If clinically indicated additional testing with an alternate test  methodology [phone]([id number]) is advised. The SARS-CoV-2 RNA is generally  detectable in upper and lower respiratory sp ecimens during the acute  phase of infection. The expected result is Negative. Fact Sheet for Patients:  BoilerBrush.com.cyhttps://www.fda.gov/media/136312/download Fact Sheet for Healthcare Providers: https://pope.com/https://www.fda.gov/media/136313/download This test is not yet approved or cleared by the [place] FDA and has been authorized for detection and/or diagnosis of SARS-CoV-2 by FDA under an Emergency Use Authorization (EUA).  This EUA will remain in effect (meaning this test can be used) for the duration of the COVID-19 declaration under Section  564(b)(1) of the Act, 21 U.S.C. section 360bbb-3(b)(1), unless the authorization is terminated or revoked sooner. Performed at [facility], [address] [place], [place] [address]   Lactic acid, plasma     Status: Abnormal   Collection Time: 03/11/19  5:52 PM  Result Value Ref Range   Lactic Acid, Venous 3.3 (HH) 0.5 - 1.9 mmol/L    Comment: CRITICAL RESULT CALLED TO, READ BACK BY AND VERIFIED WITH: Delena Serve. C. AT 1838 ON 03/11/2019 BY EVA Performed at [facility], [address], [place], [place] [address]   Comprehensive metabolic panel     Status: Abnormal   Collection Time: 03/11/19  5:52 PM  Result Value Ref Range   Sodium 137 135 - 145 mmol/L   Potassium 4.1 3.5 - 5.1 mmol/L   Chloride 105 98 - 111 mmol/L   CO2 22 22 - 32 mmol/L   Glucose, Bld 172 (H) 70 - 99 mg/dL   BUN 74 (H) 8 - 23 mg/dL   Creatinine, Ser 1.52 (H) 0.61 - 1.24 mg/dL   Calcium 9.0 8.9 - 10.3 mg/dL   Total Protein 6.8 6.5 - 8.1 g/dL   Albumin 3.2 (L) 3.5 - 5.0 g/dL   AST 38 15 - 41 U/L   ALT 24 0 - 44 U/L   Alkaline Phosphatase  60 38 - 126 U/L   Total Bilirubin 3.0 (H) 0.3 - 1.2 mg/dL   GFR calc non Af Amer 40 (L) >60 mL/min   GFR calc Af Amer 46 (L) >60 mL/min   Anion gap 10 5 - 15    Comment: Performed at [facility], [address] [place], [place] [address]  CBC WITH DIFFERENTIAL     Status: Abnormal   Collection Time: 03/11/19  5:52 PM  Result Value Ref Range   WBC 9.4 4.0 - 10.5 K/uL   RBC 4.98 4.22 - 5.81 MIL/uL   Hemoglobin 15.2 13.0 - 17.0 g/dL   HCT 46.7 39.0 - 52.0 %   MCV 93.8 80.0 - 100.0 fL   MCH 30.5 26.0 - 34.0 pg   MCHC 32.5 30.0 - 36.0 g/dL   RDW 13.0 11.5 - 15.5 %   Platelets 143 (L) 150 - 400 K/uL   nRBC 0.0 0.0 - 0.2 %   Neutrophils Relative % 85 %   Neutro Abs 8.0 (H) 1.7 - 7.7 K/uL   Lymphocytes Relative 4 %   Lymphs Abs 0.3 (L) 0.7 - 4.0 K/uL   Monocytes Relative 11 %   Monocytes Absolute 1.0 0.1 - 1.0 K/uL   Eosinophils Relative 0 %   Eosinophils Absolute 0.0 0.0 - 0.5 K/uL   Basophils Relative 0 %   Basophils Absolute 0.0 0.0 - 0.1 K/uL   Immature Granulocytes 0 %   Abs Immature Granulocytes 0.02 0.00 - 0.07 K/uL    Comment: Performed at [facility], [address], [place],  [address]  APTT     Status: Abnormal   Collection Time: 03/11/19  5:52 PM  Result Value Ref Range   aPTT 40 (H) 24 - 36 seconds    Comment:        IF BASELINE aPTT IS ELEVATED, SUGGEST PATIENT RISK ASSESSMENT BE USED TO DETERMINE APPROPRIATE ANTICOAGULANT THERAPY. Performed at [facility], [address] [place],  [address]   Protime-INR     Status: Abnormal   Collection Time: 03/11/19  5:52 PM  Result Value Ref Range   Prothrombin Time 53.5 (H) 11.4 - 15.2 seconds  INR 6.1 (HH) 0.8 - 1.2    Comment: REPEATED TO VERIFY CRITICAL RESULT CALLED TO, READ BACK BY AND VERIFIED WITH: [place],C. AT 1840 ON 03/11/2019 BY EVA (NOTE) INR goal varies based on device and disease states. Performed at [facility], [address], [place], [place] [address]   Blood Culture (routine x  2)     Status: None (Preliminary result)   Collection Time: 03/11/19  5:52 PM   Specimen: BLOOD LEFT WRIST  Result Value Ref Range   Specimen Description RIGHT ANTECUBITAL    Special Requests      BOTTLES DRAWN AEROBIC AND ANAEROBIC Blood Culture results may not be optimal due to an excessive volume of blood received in culture bottles Performed at [facility], [address], [place], [place] [address]    Culture PENDING    Report Status PENDING   Blood Culture (routine x 2)     Status: None (Preliminary result)   Collection Time: 03/11/19  6:00 PM   Specimen: BLOOD LEFT WRIST  Result Value Ref Range   Specimen Description BLOOD LEFT WRIST    Special Requests      BOTTLES DRAWN AEROBIC AND ANAEROBIC Blood Culture adequate volume Performed at [facility], [address], [place], [place] [address]    Culture PENDING    Report Status PENDING   Lactic acid, plasma     Status: Abnormal   Collection Time: 03/11/19  8:19 PM  Result Value Ref Range   Lactic Acid, Venous 2.1 (HH) 0.5 - 1.9 mmol/L    Comment: CRITICAL RESULT CALLED TO, READ BACK BY AND VERIFIED WITH: K Treyven Lafauci,RN @2058  03/11/19 [facility] Performed at [facility], [address], [place], [place] [address]    Ct Head Wo Contrast  Result Date: 03/11/2019 CLINICAL DATA:  Unwitnessed fall EXAM: CT HEAD WITHOUT CONTRAST CT CERVICAL SPINE WITHOUT CONTRAST TECHNIQUE: Multidetector CT imaging of the head and cervical spine was performed following the standard protocol without intravenous contrast. Multiplanar CT image reconstructions of the cervical spine were also generated. COMPARISON:  06/07/2019 FINDINGS: CT HEAD FINDINGS Brain: There is atrophy and chronic small vessel disease changes. No acute intracranial abnormality. Specifically, no hemorrhage, hydrocephalus, mass lesion, acute infarction, or significant intracranial injury. Vascular: No hyperdense vessel or unexpected calcification. Skull: No acute calvarial abnormality.  Sinuses/Orbits: Visualized paranasal sinuses and mastoids clear. Orbital soft tissues unremarkable. Other: No subluxation. CT CERVICAL SPINE FINDINGS Alignment: Normal. Skull base and vertebrae: No acute fracture. No primary bone lesion or focal pathologic process. Soft tissues and spinal canal: No prevertebral fluid or swelling. No visible canal hematoma. Disc levels:  Diffuse degenerative disc and facet disease. Upper chest: Airspace opacity partially imaged in the right upper lobe. Other: None IMPRESSION: Atrophy, chronic microvascular disease. No acute intracranial abnormality. No acute bony abnormality in the cervical spine. Cervical spondylosis. Airspace disease in the right upper lobe concerning for pneumonia as seen on chest x-ray. Electronically Signed   By: Charlett Nose M.D.   On: 03/11/2019 19:48   Ct Cervical Spine Wo Contrast  Result Date: 03/11/2019 CLINICAL DATA:  Unwitnessed fall EXAM: CT HEAD WITHOUT CONTRAST CT CERVICAL SPINE WITHOUT CONTRAST TECHNIQUE: Multidetector CT imaging of the head and cervical spine was performed following the standard protocol without intravenous contrast. Multiplanar CT image reconstructions of the cervical spine were also generated. COMPARISON:  06/07/2019 FINDINGS: CT HEAD FINDINGS Brain: There is atrophy and chronic small vessel disease changes. No acute intracranial abnormality. Specifically, no hemorrhage, hydrocephalus, mass lesion, acute infarction,  or significant intracranial injury. Vascular: No hyperdense vessel or unexpected calcification. Skull: No acute calvarial abnormality. Sinuses/Orbits: Visualized paranasal sinuses and mastoids clear. Orbital soft tissues unremarkable. Other: No subluxation. CT CERVICAL SPINE FINDINGS Alignment: Normal. Skull base and vertebrae: No acute fracture. No primary bone lesion or focal pathologic process. Soft tissues and spinal canal: No prevertebral fluid or swelling. No visible canal hematoma. Disc levels:  Diffuse  degenerative disc and facet disease. Upper chest: Airspace opacity partially imaged in the right upper lobe. Other: None IMPRESSION: Atrophy, chronic microvascular disease. No acute intracranial abnormality. No acute bony abnormality in the cervical spine. Cervical spondylosis. Airspace disease in the right upper lobe concerning for pneumonia as seen on chest x-ray. Electronically Signed   By: Charlett NoseKevin  Dover M.D.   On: 03/11/2019 19:48   Dg Pelvis Portable  Result Date: 03/11/2019 CLINICAL DATA:  85 year old presenting after a fall. LEFT hip pain. Initial encounter. EXAM: PORTABLE PELVIS 1-2 VIEWS COMPARISON:  None. FINDINGS: Acute comminuted intertrochanteric LEFT femoral neck fracture. LEFT hip joint anatomically aligned with mild axial joint space narrowing. Osseous demineralization. Symmetric mild axial joint space narrowing in the contralateral RIGHT hip. Bone island in the LOWER RIGHT acetabulum. Sacroiliac joints and symphysis pubis anatomically aligned without significant degenerative changes. Degenerative changes involving the visualized LOWER lumbar spine. IMPRESSION: Acute comminuted intertrochanteric LEFT femoral neck fracture. Electronically Signed   By: Hulan Saashomas  Lawrence M.D.   On: 03/11/2019 20:13   Dg Chest Portable 1 View  Result Date: 03/11/2019 CLINICAL DATA:  Fall EXAM: PORTABLE CHEST 1 VIEW COMPARISON:  02/26/2019 FINDINGS: Moderate cardiomegaly, unchanged. Remote median sternotomy. There are shallow inflation of the right lung with hazy airspace opacities. The left lung is clear. IMPRESSION: Shallow inflation of the right lung with hazy airspace opacities Electronically Signed   By: Deatra RobinsonKevin  Herman M.D.   On: 03/11/2019 20:30   Dg Foot Complete Left  Result Date: 03/11/2019 CLINICAL DATA:  Fall EXAM: LEFT FOOT-SINGLE VIEW COMPARISON:  None. FINDINGS: A single lateral view of the foot was obtained. This shows no acute fracture or dislocation. There are dorsal and posterior vascular  calcifications. There is skin thickening of the left heel. No soft tissue gas or focal ulceration. IMPRESSION: Skin thickening of the left heel.  No soft tissue gas or ulceration. Electronically Signed   By: Deatra RobinsonKevin  Herman M.D.   On: 03/11/2019 20:24   Dg Hip Unilat W Or Wo Pelvis 2-3 Views Left  Result Date: 03/11/2019 CLINICAL DATA:  Fall EXAM: DG HIP (WITH OR WITHOUT PELVIS) 2-3V LEFT COMPARISON:  None. FINDINGS: Minimally displaced intertrochanteric fracture of the proximal left femur. No femoroacetabular dislocation. Limited visualization of the remainder of the pelvis. IMPRESSION: Minimally displaced intertrochanteric fracture of the left femur. Electronically Signed   By: Deatra RobinsonKevin  Herman M.D.   On: 03/11/2019 20:22    Pending Labs Unresulted Labs (From admission, onward)    Start     Ordered   03/12/19 0500  Comprehensive metabolic panel  Tomorrow morning,   R     03/11/19 2023   03/12/19 0500  CBC  Tomorrow morning,   R     03/11/19 2023   03/11/19 1733  Urine culture  ONCE - STAT,   STAT     03/11/19 1735          Vitals/Pain Today's Vitals   03/11/19 2000 03/11/19 2017 03/11/19 2048 03/11/19 2100  BP: (!) 104/59  (!) 89/65 94/62  Pulse: 81  83 83  Resp: (!) 30  (!)  23 (!) 21  Temp:  98.4 F (36.9 C)    TempSrc:  Oral    SpO2: 96%  94% 95%  Weight:      Height:      PainSc:        Isolation Precautions No active isolations  Medications Medications  0.9 %  sodium chloride infusion (1,000 mLs Intravenous New Bag/Given 03/11/19 1823)  ceFEPIme (MAXIPIME) 2 g in sodium chloride 0.9 % 100 mL IVPB (has no administration in time range)  vancomycin (VANCOCIN) IVPB 750 mg/150 ml premix (has no administration in time range)  acetaminophen (TYLENOL) tablet 650 mg (has no administration in time range)    Or  acetaminophen (TYLENOL) suppository 650 mg (has no administration in time range)  ceFEPIme (MAXIPIME) 2 g in sodium chloride 0.9 % 100 mL IVPB (0 g Intravenous Stopped  03/11/19 1941)  metroNIDAZOLE (FLAGYL) IVPB 500 mg (0 mg Intravenous Stopped 03/11/19 2122)  vancomycin (VANCOCIN) IVPB 1000 mg/200 mL premix (0 mg Intravenous Stopped 03/11/19 2122)  sodium chloride 0.9 % bolus 1,000 mL (0 mLs Intravenous Stopped 03/11/19 1956)    And  sodium chloride 0.9 % bolus 1,000 mL (0 mLs Intravenous Stopped 03/11/19 1956)    And  sodium chloride 0.9 % bolus 500 mL (0 mLs Intravenous Stopped 03/11/19 1859)  acetaminophen (TYLENOL) suppository 650 mg (650 mg Rectal Given 03/11/19 1851)    Mobility non-ambulatory High fall risk   Focused Assessments    R Recommendations: See Admitting Provider Note  Report given to:   Additional Notes: left hip is broken, hoh, and on 6lpm of oxygen

## 2019-03-11 NOTE — H&P (Signed)
TRH H&P    Patient Demographics:    Phillip Nolan, is a 85 y.o. male  MRN: [id number]  DOB - 17-Aug-1927  Admit Date - 03/11/2019  Referring MD/NP/PA:  Eliezer Mccoy  Outpatient Primary MD for the patient is Corum, Rex Kras, MD  Patient coming from:  home  Chief complaint-  fall   HPI:    Phillip Nolan  is a 85 y.o. male, hypertension, CAD, CHF  Pafib, h/o AVR, and h/o mitral valve repair, Bph, presents with fall on Saturday and couldn't get up for a prolonged period of time.  Pt finally got up after his caregiver assisted him.  Pt was seen at home by physical therapy who could not get the patient to walk.  Pox 82% on RA.    In ED, Temperature 97.6 pulse 85 respiratory rate 35 blood pressure 101/62 pulse ox 92% with O2 nasal cannula Weight 79.4 kg  Repeat temperature 102.3  Urinalysis WBCs greater than 50, RBCs greater than 50 COVID-19- negative WBC 9.4, hemoglobin 15.2, platelet count 143 Sodium 137, potassium 4.1 BUN 74 creatinine 1.52 albumin 3.2 AST 38 ALT 24 alk phos 60, total bilirubin 3.0 INR 6.1 Blood cultures x2 pending Urine culture pending  CT brain/ C spine IMPRESSION: Atrophy, chronic microvascular disease. No acute intracranial abnormality. No acute bony abnormality in the cervical spine. Cervical spondylosis. Airspace disease in the right upper lobe concerning for pneumonia as seen on chest x-ray.  Xray left femur IMPRESSION: Minimally displaced intertrochanteric fracture of the left femur.  CXR IMPRESSION: Shallow inflation of the right lung with hazy airspace opacities  Pt will be admitted for sepsis secondary to UTI vs Hcap and left intertrochanteric fracture of the left femur    Review of systems:    In addition to the HPI above, unable to obtain clearly due to somnolence  No Fever-chills, No Headache, No changes with Vision or hearing, No problems swallowing  food or Liquids, No Chest pain, Cough or Shortness of Breath, No Abdominal pain, No Nausea or Vomiting, bowel movements are regular, No Blood in stool or Urine, No dysuria, No new skin rashes or bruises,   No new weakness, tingling, numbness in any extremity, No recent weight gain or loss, No polyuria, polydypsia or polyphagia, No significant Mental Stressors.  All other systems reviewed and are negative.    Past History of the following :    Past Medical History:  Diagnosis Date   Aortic valve disease    Status post AVR 2011   Atrial fibrillation (HCC)    BPH (benign prostatic hyperplasia)    CHF (congestive heart failure) ([place])    Coronary atherosclerosis of native coronary artery    Nonobstructive   Dementia ([place])    Essential hypertension    History of bacteremia    Streptococcus gordonii bacteremia 2/11 - no clear vegetation by TEE   HOH (hard of hearing)    Mitral valve disease    Status post annular repair 2011      Past Surgical History:  Procedure Laterality Date  Median sterntomy  4/11   Dr. Roxy Manns - AVR (27 mm Edwards pericardial) and MV repair   MULTIPLE TOOTH EXTRACTIONS  4/11   (pre-op above)      Social History:      Social History   Tobacco Use   Smoking status: Former Smoker    Types: Pipe, Cigars    Quit date: 07/18/1978    Years since quitting: 40.6   Smokeless tobacco: Never Used  Substance Use Topics   Alcohol use: No    Alcohol/week: 0.0 standard drinks       Family History :     Family History  Family history unknown: Yes   Pt is unable to provide family hx due to somnolence.    Home Medications:   Prior to Admission medications   Medication Sig Start Date End Date Taking? Authorizing Provider  acetaminophen (TYLENOL) 325 MG tablet Take 2 tablets (650 mg total) by mouth every 4 (four) hours as needed for headache or mild pain. 02/28/19  Yes Emokpae, Courage, MD  lisinopril (ZESTRIL) 2.5 MG tablet Take 1  tablet daily Patient taking differently: Take 2.5 mg by mouth daily.  02/28/19  Yes Roxan Hockey, MD  warfarin (COUMADIN) 5 MG tablet Take 1.5 tablets (7.5 mg total) by mouth every morning. TAKE 1&1/2 TABLETS DAILY or as directed Patient taking differently: Take 7.5 mg by mouth every evening.  03/01/19  Yes Roxan Hockey, MD     Allergies:     Allergies  Allergen Reactions   Ciprofloxacin     Daughter reports caused more confusion, and INR to be abnormal   Penicillins Rash    .Did it involve swelling of the face/tongue/throat, SOB, or low BP? {No Did it involve sudden or severe rash/hives, skin peeling, or any reaction on the inside of your mouth or nose? Yes Did you need to seek medical attention at a hospital or doctor's office? Unknown When did it last happen?Childhood If all above answers are NO, may proceed with cephalosporin use.       Physical Exam:   Vitals  Blood pressure 91/60, pulse 82, temperature 98.4 F (36.9 C), temperature source Oral, resp. rate (!) 22, height 6' (1.829 m), weight 79.4 kg, SpO2 96 %.  1.  General: Somnolent  2. Psychiatric: Euthymic  3. Neurologic: Cranial nerves II through XII intact, reflexes 2+, symmetric, diffuse with downgoing toes bilaterally, motor 5/5 in all 4 extremities in response to pain  4. HEENMT:  Anicteric, pupils 1.5 mm, symmetric, direct, consensual reflexes intact Neck no JVD  5. Respiratory : Decreased breath sounds at the right base, crackles at the right base, right mid lung, crackles at the left base, no wheezing  6. Cardiovascular : Regular rate rhythm S1-S2  7. Gastrointestinal:  Abdomen: Obese soft nontender nondistended positive bowel sounds  8. Skin:  Extremities no cyanosis clubbing or edema  9.Musculoskeletal:  Good range of motion    Data Review:    CBC Recent Labs  Lab 03/11/19 1752  WBC 9.4  HGB 15.2  HCT 46.7  PLT 143*  MCV 93.8  MCH 30.5  MCHC 32.5  RDW 13.0    LYMPHSABS 0.3*  MONOABS 1.0  EOSABS 0.0  BASOSABS 0.0   ------------------------------------------------------------------------------------------------------------------  Results for orders placed or performed during the hospital encounter of 03/11/19 (from the past 48 hour(s))  Urinalysis, Routine w reflex microscopic     Status: Abnormal   Collection Time: 03/11/19  5:33 PM  Result Value Ref Range  Color, Urine AMBER (A) YELLOW    Comment: BIOCHEMICALS MAY BE AFFECTED BY COLOR   APPearance HAZY (A) CLEAR   Specific Gravity, Urine 1.017 1.005 - 1.030   pH 5.0 5.0 - 8.0   Glucose, UA NEGATIVE NEGATIVE mg/dL   Hgb urine dipstick MODERATE (A) NEGATIVE   Bilirubin Urine NEGATIVE NEGATIVE   Ketones, ur NEGATIVE NEGATIVE mg/dL   Protein, ur 30 (A) NEGATIVE mg/dL   Nitrite NEGATIVE NEGATIVE   Leukocytes,Ua MODERATE (A) NEGATIVE   RBC / HPF >50 (H) 0 - 5 RBC/hpf   WBC, UA >50 (H) 0 - 5 WBC/hpf   Bacteria, UA NONE SEEN NONE SEEN   WBC Clumps PRESENT    Mucus PRESENT    Hyaline Casts, UA PRESENT     Comment: Performed at [facility], [address], [place], [place] [address]  SARS Coronavirus 2 [facility] order, Performed in [facility] hospital lab) Nasopharyngeal Nasopharyngeal Swab     Status: None   Collection Time: 03/11/19  5:36 PM   Specimen: Nasopharyngeal Swab  Result Value Ref Range   SARS Coronavirus 2 NEGATIVE NEGATIVE    Comment: (NOTE) If result is NEGATIVE SARS-CoV-2 target nucleic acids are NOT DETECTED. The SARS-CoV-2 RNA is generally detectable in upper and lower  respiratory specimens during the acute phase of infection. The lowest  concentration of SARS-CoV-2 viral copies this assay can detect is 250  copies / mL. A negative result does not preclude SARS-CoV-2 infection  and should not be used as the sole basis for treatment or other  patient management decisions.  A negative result may occur with  improper specimen collection / handling, submission of  specimen other  than nasopharyngeal swab, presence of viral mutation(s) within the  areas targeted by this assay, and inadequate number of viral copies  (<250 copies / mL). A negative result must be combined with clinical  observations, patient history, and epidemiological information. If result is POSITIVE SARS-CoV-2 target nucleic acids are DETECTED. The SARS-CoV-2 RNA is generally detectable in upper and lower  respiratory specimens dur ing the acute phase of infection.  Positive  results are indicative of active infection with SARS-CoV-2.  Clinical  correlation with patient history and other diagnostic information is  necessary to determine patient infection status.  Positive results do  not rule out bacterial infection or co-infection with other viruses. If result is PRESUMPTIVE POSTIVE SARS-CoV-2 nucleic acids MAY BE PRESENT.   A presumptive positive result was obtained on the submitted specimen  and confirmed on repeat testing.  While 2019 novel coronavirus  (SARS-CoV-2) nucleic acids may be present in the submitted sample  additional confirmatory testing may be necessary for epidemiological  and / or clinical management purposes  to differentiate between  SARS-CoV-2 and other Sarbecovirus currently known to infect humans.  If clinically indicated additional testing with an alternate test  methodology [phone]) is advised. The SARS-CoV-2 RNA is generally  detectable in upper and lower respiratory sp ecimens during the acute  phase of infection. The expected result is Negative. Fact Sheet for Patients:  StrictlyIdeas.no Fact Sheet for Healthcare Providers: BankingDealers.co.za This test is not yet approved or cleared by the [place] FDA and has been authorized for detection and/or diagnosis of SARS-CoV-2 by FDA under an Emergency Use Authorization (EUA).  This EUA will remain in effect (meaning this test can be used) for the  duration of the COVID-19 declaration under Section 564(b)(1) of the Act, 21 U.S.C. section 360bbb-3(b)(1), unless the authorization is  terminated or revoked sooner. Performed at [facility], [address], [place], [place] [address]   Lactic acid, plasma     Status: Abnormal   Collection Time: 03/11/19  5:52 PM  Result Value Ref Range   Lactic Acid, Venous 3.3 (HH) 0.5 - 1.9 mmol/L    Comment: CRITICAL RESULT CALLED TO, READ BACK BY AND VERIFIED WITH: Delena Serve. C. AT 1838 ON 03/11/2019 BY EVA Performed at [facility], [address], [place], [place] [address]   Comprehensive metabolic panel     Status: Abnormal   Collection Time: 03/11/19  5:52 PM  Result Value Ref Range   Sodium 137 135 - 145 mmol/L   Potassium 4.1 3.5 - 5.1 mmol/L   Chloride 105 98 - 111 mmol/L   CO2 22 22 - 32 mmol/L   Glucose, Bld 172 (H) 70 - 99 mg/dL   BUN 74 (H) 8 - 23 mg/dL   Creatinine, Ser 1.52 (H) 0.61 - 1.24 mg/dL   Calcium 9.0 8.9 - 10.3 mg/dL   Total Protein 6.8 6.5 - 8.1 g/dL   Albumin 3.2 (L) 3.5 - 5.0 g/dL   AST 38 15 - 41 U/L   ALT 24 0 - 44 U/L   Alkaline Phosphatase 60 38 - 126 U/L   Total Bilirubin 3.0 (H) 0.3 - 1.2 mg/dL   GFR calc non Af Amer 40 (L) >60 mL/min   GFR calc Af Amer 46 (L) >60 mL/min   Anion gap 10 5 - 15    Comment: Performed at [facility], [address], [place], [place] [address]  CBC WITH DIFFERENTIAL     Status: Abnormal   Collection Time: 03/11/19  5:52 PM  Result Value Ref Range   WBC 9.4 4.0 - 10.5 K/uL   RBC 4.98 4.22 - 5.81 MIL/uL   Hemoglobin 15.2 13.0 - 17.0 g/dL   HCT 46.7 39.0 - 52.0 %   MCV 93.8 80.0 - 100.0 fL   MCH 30.5 26.0 - 34.0 pg   MCHC 32.5 30.0 - 36.0 g/dL   RDW 13.0 11.5 - 15.5 %   Platelets 143 (L) 150 - 400 K/uL   nRBC 0.0 0.0 - 0.2 %   Neutrophils Relative % 85 %   Neutro Abs 8.0 (H) 1.7 - 7.7 K/uL   Lymphocytes Relative 4 %   Lymphs Abs 0.3 (L) 0.7 - 4.0 K/uL   Monocytes Relative 11 %   Monocytes Absolute 1.0 0.1 - 1.0 K/uL    Eosinophils Relative 0 %   Eosinophils Absolute 0.0 0.0 - 0.5 K/uL   Basophils Relative 0 %   Basophils Absolute 0.0 0.0 - 0.1 K/uL   Immature Granulocytes 0 %   Abs Immature Granulocytes 0.02 0.00 - 0.07 K/uL    Comment: Performed at [facility], [address], [place], [place] [address]  APTT     Status: Abnormal   Collection Time: 03/11/19  5:52 PM  Result Value Ref Range   aPTT 40 (H) 24 - 36 seconds    Comment:        IF BASELINE aPTT IS ELEVATED, SUGGEST PATIENT RISK ASSESSMENT BE USED TO DETERMINE APPROPRIATE ANTICOAGULANT THERAPY. Performed at [facility], [address], [place],  [address]   Protime-INR     Status: Abnormal   Collection Time: 03/11/19  5:52 PM  Result Value Ref Range   Prothrombin Time 53.5 (H) 11.4 - 15.2 seconds   INR 6.1 (HH) 0.8 - 1.2    Comment: REPEATED  TO VERIFY CRITICAL RESULT CALLED TO, READ BACK BY AND VERIFIED WITH: [place],C. AT [place] ON 03/11/2019 BY EVA (NOTE) INR goal varies based on device and disease states. Performed at [facility], [address], [place], [place] [address]   Blood Culture (routine x 2)     Status: None (Preliminary result)   Collection Time: 03/11/19  5:52 PM   Specimen: BLOOD LEFT WRIST  Result Value Ref Range   Specimen Description RIGHT ANTECUBITAL    Special Requests      BOTTLES DRAWN AEROBIC AND ANAEROBIC Blood Culture results may not be optimal due to an excessive volume of blood received in culture bottles Performed at [facility], [address], [place], [place] [address]    Culture PENDING    Report Status PENDING   Blood Culture (routine x 2)     Status: None (Preliminary result)   Collection Time: 03/11/19  6:00 PM   Specimen: BLOOD LEFT WRIST  Result Value Ref Range   Specimen Description BLOOD LEFT WRIST    Special Requests      BOTTLES DRAWN AEROBIC AND ANAEROBIC Blood Culture adequate volume Performed at [facility], [address] [place], [place] [address]    Culture PENDING     Report Status PENDING   Lactic acid, plasma     Status: Abnormal   Collection Time: 03/11/19  8:19 PM  Result Value Ref Range   Lactic Acid, Venous 2.1 (HH) 0.5 - 1.9 mmol/L    Comment: CRITICAL RESULT CALLED TO, READ BACK BY AND VERIFIED WITH: K BELTON,RN '@[id number]'  03/11/19 MKELLY Performed at [facility], [address] [place], [place] [address]     Chemistries  Recent Labs  Lab 03/11/19 1752  NA 137  K 4.1  CL 105  CO2 22  GLUCOSE 172*  BUN 74*  CREATININE 1.52*  CALCIUM 9.0  AST 38  ALT 24  ALKPHOS 60  BILITOT 3.0*   ------------------------------------------------------------------------------------------------------------------  ------------------------------------------------------------------------------------------------------------------ GFR: Estimated Creatinine Clearance: 36.2 mL/min (A) (by C-G formula based on SCr of 1.52 mg/dL (H)). Liver Function Tests: Recent Labs  Lab 03/11/19 1752  AST 38  ALT 24  ALKPHOS 60  BILITOT 3.0*  PROT 6.8  ALBUMIN 3.2*   No results for input(s): LIPASE, AMYLASE in the last 168 hours. No results for input(s): AMMONIA in the last 168 hours. Coagulation Profile: Recent Labs  Lab 03/08/19 03/11/19 1752  INR 1.5* 6.1*   Cardiac Enzymes: No results for input(s): CKTOTAL, CKMB, CKMBINDEX, TROPONINI in the last 168 hours. BNP (last 3 results) No results for input(s): PROBNP in the last 8760 hours. HbA1C: No results for input(s): HGBA1C in the last 72 hours. CBG: No results for input(s): GLUCAP in the last 168 hours. Lipid Profile: No results for input(s): CHOL, HDL, LDLCALC, TRIG, CHOLHDL, LDLDIRECT in the last 72 hours. Thyroid Function Tests: No results for input(s): TSH, T4TOTAL, FREET4, T3FREE, THYROIDAB in the last 72 hours. Anemia Panel: No results for input(s): VITAMINB12, FOLATE, FERRITIN, TIBC, IRON, RETICCTPCT in the last 72  hours.  --------------------------------------------------------------------------------------------------------------- Urine analysis:    Component Value Date/Time   COLORURINE AMBER (A) 03/11/2019 1733   APPEARANCEUR HAZY (A) 03/11/2019 1733   LABSPEC 1.017 03/11/2019 1733   PHURINE 5.0 03/11/2019 1733   GLUCOSEU NEGATIVE 03/11/2019 1733   HGBUR MODERATE (A) 03/11/2019 1733   BILIRUBINUR NEGATIVE 03/11/2019 1733   KETONESUR NEGATIVE 03/11/2019 1733   PROTEINUR 30 (A) 03/11/2019 1733   UROBILINOGEN 0.2 10/23/2009 1700   NITRITE NEGATIVE 03/11/2019  [place] (A) 03/11/2019 1733      Imaging Results:    Ct Head Wo Contrast  Result Date: 03/11/2019 CLINICAL DATA:  Unwitnessed fall EXAM: CT HEAD WITHOUT CONTRAST CT CERVICAL SPINE WITHOUT CONTRAST TECHNIQUE: Multidetector CT imaging of the head and cervical spine was performed following the standard protocol without intravenous contrast. Multiplanar CT image reconstructions of the cervical spine were also generated. COMPARISON:  06/07/2019 FINDINGS: CT HEAD FINDINGS Brain: There is atrophy and chronic small vessel disease changes. No acute intracranial abnormality. Specifically, no hemorrhage, hydrocephalus, mass lesion, acute infarction, or significant intracranial injury. Vascular: No hyperdense vessel or unexpected calcification. Skull: No acute calvarial abnormality. Sinuses/Orbits: Visualized paranasal sinuses and mastoids clear. Orbital soft tissues unremarkable. Other: No subluxation. CT CERVICAL SPINE FINDINGS Alignment: Normal. Skull base and vertebrae: No acute fracture. No primary bone lesion or focal pathologic process. Soft tissues and spinal canal: No prevertebral fluid or swelling. No visible canal hematoma. Disc levels:  Diffuse degenerative disc and facet disease. Upper chest: Airspace opacity partially imaged in the right upper lobe. Other: None IMPRESSION: Atrophy, chronic microvascular disease. No acute  intracranial abnormality. No acute bony abnormality in the cervical spine. Cervical spondylosis. Airspace disease in the right upper lobe concerning for pneumonia as seen on chest x-ray. Electronically Signed   By: Rolm Baptise M.D.   On: 03/11/2019 19:48   Ct Cervical Spine Wo Contrast  Result Date: 03/11/2019 CLINICAL DATA:  Unwitnessed fall EXAM: CT HEAD WITHOUT CONTRAST CT CERVICAL SPINE WITHOUT CONTRAST TECHNIQUE: Multidetector CT imaging of the head and cervical spine was performed following the standard protocol without intravenous contrast. Multiplanar CT image reconstructions of the cervical spine were also generated. COMPARISON:  06/07/2019 FINDINGS: CT HEAD FINDINGS Brain: There is atrophy and chronic small vessel disease changes. No acute intracranial abnormality. Specifically, no hemorrhage, hydrocephalus, mass lesion, acute infarction, or significant intracranial injury. Vascular: No hyperdense vessel or unexpected calcification. Skull: No acute calvarial abnormality. Sinuses/Orbits: Visualized paranasal sinuses and mastoids clear. Orbital soft tissues unremarkable. Other: No subluxation. CT CERVICAL SPINE FINDINGS Alignment: Normal. Skull base and vertebrae: No acute fracture. No primary bone lesion or focal pathologic process. Soft tissues and spinal canal: No prevertebral fluid or swelling. No visible canal hematoma. Disc levels:  Diffuse degenerative disc and facet disease. Upper chest: Airspace opacity partially imaged in the right upper lobe. Other: None IMPRESSION: Atrophy, chronic microvascular disease. No acute intracranial abnormality. No acute bony abnormality in the cervical spine. Cervical spondylosis. Airspace disease in the right upper lobe concerning for pneumonia as seen on chest x-ray. Electronically Signed   By: Rolm Baptise M.D.   On: 03/11/2019 19:48   Dg Pelvis Portable  Result Date: 03/11/2019 CLINICAL DATA:  85 year old presenting after a fall. LEFT hip pain. Initial  encounter. EXAM: PORTABLE PELVIS 1-2 VIEWS COMPARISON:  None. FINDINGS: Acute comminuted intertrochanteric LEFT femoral neck fracture. LEFT hip joint anatomically aligned with mild axial joint space narrowing. Osseous demineralization. Symmetric mild axial joint space narrowing in the contralateral RIGHT hip. Bone island in the LOWER RIGHT acetabulum. Sacroiliac joints and symphysis pubis anatomically aligned without significant degenerative changes. Degenerative changes involving the visualized LOWER lumbar spine. IMPRESSION: Acute comminuted intertrochanteric LEFT femoral neck fracture. Electronically Signed   By: Evangeline Dakin M.D.   On: 03/11/2019 20:13   Dg Chest Portable 1 View  Result Date: 03/11/2019 CLINICAL DATA:  Fall EXAM: PORTABLE CHEST 1 VIEW COMPARISON:  02/26/2019 FINDINGS: Moderate cardiomegaly, unchanged. Remote median sternotomy. There are shallow  inflation of the right lung with hazy airspace opacities. The left lung is clear. IMPRESSION: Shallow inflation of the right lung with hazy airspace opacities Electronically Signed   By: Ulyses Jarred M.D.   On: 03/11/2019 20:30   Dg Foot Complete Left  Result Date: 03/11/2019 CLINICAL DATA:  Fall EXAM: LEFT FOOT-SINGLE VIEW COMPARISON:  None. FINDINGS: A single lateral view of the foot was obtained. This shows no acute fracture or dislocation. There are dorsal and posterior vascular calcifications. There is skin thickening of the left heel. No soft tissue gas or focal ulceration. IMPRESSION: Skin thickening of the left heel.  No soft tissue gas or ulceration. Electronically Signed   By: Ulyses Jarred M.D.   On: 03/11/2019 20:24   Dg Hip Unilat W Or Wo Pelvis 2-3 Views Left  Result Date: 03/11/2019 CLINICAL DATA:  Fall EXAM: DG HIP (WITH OR WITHOUT PELVIS) 2-3V LEFT COMPARISON:  None. FINDINGS: Minimally displaced intertrochanteric fracture of the proximal left femur. No femoroacetabular dislocation. Limited visualization of the remainder  of the pelvis. IMPRESSION: Minimally displaced intertrochanteric fracture of the left femur. Electronically Signed   By: Ulyses Jarred M.D.   On: 03/11/2019 20:22   Ekg: nsr at 85,RAD, nl int, RBBB, T inversion in 1, avl,    Assessment & Plan:    Principal Problem:   Sepsis ([place]) Active Problems:   Essential hypertension, benign   Chronic atrial fibrillation   Acute lower UTI   ARF (acute renal failure) (HCC)   HCAP (healthcare-associated pneumonia)  Sepsis secondary to Hcap vs UTI Blood culture x2 Urine culture Check urine strep antigen, check urine legionella antigen vanco iv, cefepime iv  Mild ARF Hydrate with ns iv Check cmp in am  Hypotension Check trop I q3h x2 Check cortisol Check cardiac echo Hydrate with ns iv  Hypertension STOP Lisinopril due to hypotension  L intertrochanteric hip fracture NPO  Orthopedics consulted by ED, appreciate input  Pafib Coumadin, pharmacy to dose  Supratherapeutic INR Can hold coumadin for now Pharmacy to manage, appreciate input  Please call daughter [phone]  in AM or meet with daughter to review patient's case. Thanks  DVT Prophylaxis-   coumadin  AM Labs Ordered, also please review Full Orders  Family Communication: Admission, patients condition and plan of care including tests being ordered have been discussed with the patient who indicate understanding and agree with the plan and Code Status.  Code Status:   FULL CODE,  D/w daughter, full code for now, she seems reasonable in that if his prognosis is poor she would be willing to keep him comfortable and change to DNR, please discuss further in AM  Admission status: /Inpatient: Based on patients clinical presentation and evaluation of above clinical data, I have made determination that patient meets Inpatient criteria at this time.  Pt is critically ill.  Hypotensive from septic shock secondary to Hcap, uti.  Pt will require ivfluid and also iv abx.  Pt has high  risk of clinical deterioration.  I signed out this patient to Dr. Darrick Meigs.  Pt will require > 2 nites stay. Inpatient status.    Time spent in minutes : 70   Jani Gravel M.D on 03/11/2019 at 10:10 PM
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
## 2019-03-11 NOTE — Progress Notes (Signed)
Pharmacy Antibiotic Note  Phillip Nolan is a 85 y.o. male admitted on 03/11/2019 with sepsis.  Pharmacy has been consulted for vancomycin and cefepime dosing.  Plan: Vancomycin 750mg   IV every 12 hours.  Goal trough 15-20 mcg/mL. cefepime 2gm iv q12h  Height: 6' (182.9 cm) Weight: 175 lb (79.4 kg) IBW/kg (Calculated) : 77.6  Temp (24hrs), Avg:97.6 F (36.4 C), Min:97.6 F (36.4 C), Max:97.6 F (36.4 C)  No results for input(s): WBC, CREATININE, LATICACIDVEN, VANCOTROUGH, VANCOPEAK, VANCORANDOM, GENTTROUGH, GENTPEAK, GENTRANDOM, TOBRATROUGH, TOBRAPEAK, TOBRARND, AMIKACINPEAK, AMIKACINTROU, AMIKACIN in the last 168 hours.  Estimated Creatinine Clearance: 52.9 mL/min (by C-G formula based on SCr of 1.04 mg/dL).    Allergies  Allergen Reactions  . Penicillins Rash    .Did it involve swelling of the face/tongue/throat, SOB, or low BP? {No Did it involve sudden or severe rash/hives, skin peeling, or any reaction on the inside of your mouth or nose? Yes Did you need to seek medical attention at a hospital or doctor's office? Unknown When did it last happen?Childhood If all above answers are "NO", may proceed with cephalosporin use.      Antimicrobials this admission: 8/20 cefepime >>  8/20 vancomycin >>  8/20 metronidazole x 1    Microbiology results: 8/20 BCx: sent  8/20 UCx: sent     Thank you for allowing pharmacy to be a part of this patient's care.  Donna Christen Tora Prunty 03/11/2019 6:06 PM
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
## 2019-03-11 NOTE — ED Triage Notes (Signed)
Recent fall and recent UTI.  EMS called out Physical Therapist.  Pt in urine and PT not about to get pt to walk.   EMS reports Pt Sats 82% RA and placed on O2@ 6L/m, increased to 88% on O2..  Pt in A-fib and cbg 165.  Pt denies pain.  Pt smells of urine and clothing is wet.

## 2019-03-11 NOTE — Telephone Encounter (Signed)
Dr Holly Bodily Please advise?
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
## 2019-03-11 NOTE — ED Notes (Signed)
Date and time results received: 03/11/19 1840 (use smartphrase ".now" to insert current time)  Test:lacacid and INR Critical Value: 3.3 and 6.1  Name of Provider Notified: Dr Lacinda Axon and PA law  Orders Received? Or Actions Taken?: see chart

## 2019-03-11 NOTE — Telephone Encounter (Signed)
Suezanne Jacquet is calling from [place] and states patient had a fall on Saturday and EMS came out and accessed him and he was fine. Patient has not left couched since Saturday. EMS is there now getting patient as he has Oxygen is 82 and low BP of 102/52, he is being transported to [facility]. He states he is needing placement as the home is not the place for him right now. He suggest atleast 4 weeks of rehab in a facility.

## 2019-03-12 ENCOUNTER — Telehealth: Payer: Self-pay | Admitting: *Deleted

## 2019-03-12 ENCOUNTER — Other Ambulatory Visit (HOSPITAL_COMMUNITY): Payer: Medicare Other

## 2019-03-12 ENCOUNTER — Other Ambulatory Visit: Payer: Self-pay

## 2019-03-12 DIAGNOSIS — Z515 Encounter for palliative care: Secondary | ICD-10-CM

## 2019-03-12 DIAGNOSIS — Z9889 Other specified postprocedural states: Secondary | ICD-10-CM

## 2019-03-12 DIAGNOSIS — I480 Paroxysmal atrial fibrillation: Secondary | ICD-10-CM

## 2019-03-12 DIAGNOSIS — I4819 Other persistent atrial fibrillation: Secondary | ICD-10-CM

## 2019-03-12 DIAGNOSIS — L8990 Pressure ulcer of unspecified site, unspecified stage: Secondary | ICD-10-CM | POA: Insufficient documentation

## 2019-03-12 DIAGNOSIS — Z952 Presence of prosthetic heart valve: Secondary | ICD-10-CM

## 2019-03-12 DIAGNOSIS — Z01818 Encounter for other preprocedural examination: Secondary | ICD-10-CM

## 2019-03-12 DIAGNOSIS — S72002A Fracture of unspecified part of neck of left femur, initial encounter for closed fracture: Secondary | ICD-10-CM

## 2019-03-12 DIAGNOSIS — I10 Essential (primary) hypertension: Secondary | ICD-10-CM

## 2019-03-12 DIAGNOSIS — Z7189 Other specified counseling: Secondary | ICD-10-CM

## 2019-03-12 DIAGNOSIS — I38 Endocarditis, valve unspecified: Secondary | ICD-10-CM

## 2019-03-12 LAB — CBC
HCT: 36.5 % — ABNORMAL LOW (ref 39.0–52.0)
Hemoglobin: 11.8 g/dL — ABNORMAL LOW (ref 13.0–17.0)
MCH: 30.3 pg (ref 26.0–34.0)
MCHC: 32.3 g/dL (ref 30.0–36.0)
MCV: 93.6 fL (ref 80.0–100.0)
Platelets: 101 10*3/uL — ABNORMAL LOW (ref 150–400)
RBC: 3.9 MIL/uL — ABNORMAL LOW (ref 4.22–5.81)
RDW: 13.2 % (ref 11.5–15.5)
WBC: 7.5 10*3/uL (ref 4.0–10.5)
nRBC: 0 % (ref 0.0–0.2)

## 2019-03-12 LAB — COMPREHENSIVE METABOLIC PANEL
ALT: 21 U/L (ref 0–44)
AST: 27 U/L (ref 15–41)
Albumin: 2.4 g/dL — ABNORMAL LOW (ref 3.5–5.0)
Alkaline Phosphatase: 45 U/L (ref 38–126)
Anion gap: 3 — ABNORMAL LOW (ref 5–15)
BUN: 69 mg/dL — ABNORMAL HIGH (ref 8–23)
CO2: 21 mmol/L — ABNORMAL LOW (ref 22–32)
Calcium: 8.1 mg/dL — ABNORMAL LOW (ref 8.9–10.3)
Chloride: 116 mmol/L — ABNORMAL HIGH (ref 98–111)
Creatinine, Ser: 1.11 mg/dL (ref 0.61–1.24)
GFR calc Af Amer: >60 mL/min (ref >60)
GFR calc non Af Amer: 59 mL/min — ABNORMAL LOW (ref >60)
Glucose, Bld: 139 mg/dL — ABNORMAL HIGH (ref 70–99)
Potassium: 4 mmol/L (ref 3.5–5.1)
Sodium: 140 mmol/L (ref 135–145)
Total Bilirubin: 1.8 mg/dL — ABNORMAL HIGH (ref 0.3–1.2)
Total Protein: 5.2 g/dL — ABNORMAL LOW (ref 6.5–8.1)

## 2019-03-12 LAB — CORTISOL: Cortisol, Plasma: 24.8 ug/dL

## 2019-03-12 LAB — MRSA PCR SCREENING: MRSA by PCR: NEGATIVE

## 2019-03-12 LAB — TROPONIN I (HIGH SENSITIVITY): Troponin I (High Sensitivity): 46 ng/L — ABNORMAL HIGH (ref <18)

## 2019-03-12 LAB — PROTIME-INR
INR: 7.7 (ref 0.8–1.2)
Prothrombin Time: 63.9 seconds — ABNORMAL HIGH (ref 11.4–15.2)

## 2019-03-12 LAB — LACTIC ACID, PLASMA: Lactic Acid, Venous: 1.1 mmol/L (ref 0.5–1.9)

## 2019-03-12 MED ORDER — SODIUM CHLORIDE 0.9 % IV SOLN: 1000.0000 mL | INTRAVENOUS | Status: DC

## 2019-03-12 MED ORDER — CHLORHEXIDINE GLUCONATE CLOTH 2 % EX PADS
6.0000 | MEDICATED_PAD | Freq: Every day | CUTANEOUS | Status: DC
  Administered 2019-03-12 – 2019-03-14 (×3): 6 via TOPICAL

## 2019-03-12 MED ORDER — VITAMIN K1 10 MG/ML IJ SOLN: 2.0000 mg | Freq: Once | INTRAMUSCULAR | Status: DC

## 2019-03-12 MED ORDER — SODIUM CHLORIDE 0.9 % IV SOLN
1000.0000 mL | INTRAVENOUS | Status: DC
  Administered 2019-03-12: 1000 mL via INTRAVENOUS

## 2019-03-12 MED ORDER — VITAMIN K1 10 MG/ML IJ SOLN
5.0000 mg | Freq: Once | INTRAMUSCULAR | Status: AC
  Administered 2019-03-12: 09:00:00 5 mg via SUBCUTANEOUS
  Filled 2019-03-12: qty 1

## 2019-03-12 MED ORDER — MORPHINE SULFATE (PF) 2 MG/ML IV SOLN: 2.0000 mg | INTRAVENOUS | Status: DC | PRN

## 2019-03-12 NOTE — Consult Note (Signed)
Cardiology Consultation:   Patient ID: Phillip Nolan MRN: [id number]; DOB: [birth year]  Admit date: 03/11/2019 Date of Consult: 03/12/2019  Primary Care Provider: Wandra Feinsteinorum, Lisa L, MD Primary Cardiologist: Dr. Simona HuhSam McDowell   Patient Profile:   Phillip Nolan is a 85 y.o. male with a hx of atrial fibrillation and valvular heart disease who is being seen today for Preoperative risk stratification at the request of Dr. Laural BenesJohnson.  History of Present Illness:   Phillip Nolan is an 85 year old male with a history of "permanent atrial fibrillation" and aortic stenosis status post bioprosthetic aortic valve replacement in 2011.  He also has a history of mitral valve repair. He was last evaluated by Dr. Diona BrownerMcDowell on 02/13/2018.  At that time he was noted to be noncompliant with warfarin and had a supratherapeutic INR.  He had slow atrial fibrillation at that time.  He presented to the ED on the evening of 03/11/2019 with a fall.  He apparently fell on Saturday and was unable to get up on his own for a prolonged period of time.  His caregiver finally assisted him.  He was evaluated by physical therapy at home and the patient was unable to walk.  In the ED x-ray showed minimally displaced intertrochanteric fracture of the left femur.  Chest x-ray showed shallow inflation of the right lung with hazy airspace opacities.  Head CT showed chronic microvascular disease and atrophy with no acute intracranial abnormalities with some cervical spondylosis.  INR was supratherapeutic at 7.7 today and 6.1 on admission.  High-sensitivity troponins are 43 and 46.  He had some mild thrombocytopenia with a platelet count of 101.  He was mildly anemic with a hemoglobin of 11.8.  BUN elevated at 74 and most recently 69.  Creatinine had been 1.52 yesterday and is 1.11 today.  Urine positive for moderate leukocytes with both white and red blood cells seen and moderate hemoglobin.  He was hypotensive.   Lisinopril was stopped and he was started on IV fluids.  Blood and urine cultures have been checked and he was given vancomycin and cefepime.  I spoke with his daughter, Lanora Manislizabeth, for some time. She [place]. She said her brother died in a car accident 2 yrs ago.  Her father lives with her sister-in-law and her nephew. She said it is too much of a burden for them to take care of their ailing father.  He has advanced dementia and has very limited mobility. She said he has had a very difficult life but used to be a very strong man and is very stubborn.  She was tearful when talking about what decisions she would have to make regarding her father.  Heart Pathway Score:     Past Medical History:  Diagnosis Date  . Aortic valve disease    Status post AVR 2011  . Atrial fibrillation (HCC)   . BPH (benign prostatic hyperplasia)   . CHF (congestive heart failure) (HCC)   . Coronary atherosclerosis of native coronary artery    Nonobstructive  . Dementia (HCC)   . Essential hypertension   . History of bacteremia    Streptococcus gordonii bacteremia 2/11 - no clear vegetation by TEE  . HOH (hard of hearing)   . Mitral valve disease    Status post annular repair 2011    Past Surgical History:  Procedure Laterality Date  . Median sterntomy  4/11   Dr. Cornelius Moraswen - AVR (27 mm Edwards pericardial) and MV  repair  . MULTIPLE TOOTH EXTRACTIONS  4/11   (pre-op above)     Home Medications:  Prior to Admission medications   Medication Sig Start Date End Date Taking? Authorizing Provider  acetaminophen (TYLENOL) 325 MG tablet Take 2 tablets (650 mg total) by mouth every 4 (four) hours as needed for headache or mild pain. 02/28/19  Yes Emokpae, Courage, MD  lisinopril (ZESTRIL) 2.5 MG tablet Take 1 tablet daily Patient taking differently: Take 2.5 mg by mouth daily.  02/28/19  Yes Shon Hale, MD  warfarin (COUMADIN) 5 MG tablet Take 1.5 tablets (7.5 mg total) by  mouth every morning. TAKE 1&1/2 TABLETS DAILY or as directed Patient taking differently: Take 7.5 mg by mouth every evening.  03/01/19  Yes Shon Hale, MD    Inpatient Medications: Scheduled Meds: . Chlorhexidine Gluconate Cloth  6 each Topical Daily   Continuous Infusions: . sodium chloride 125 mL/hr at 03/12/19 0646  . ceFEPime (MAXIPIME) IV Stopped (03/12/19 0640)   PRN Meds: acetaminophen **OR** acetaminophen  Allergies:    Allergies  Allergen Reactions  . Ciprofloxacin     Daughter reports caused more confusion, and INR to be abnormal  . Penicillins Rash    .Did it involve swelling of the face/tongue/throat, SOB, or low BP? {No Did it involve sudden or severe rash/hives, skin peeling, or any reaction on the inside of your mouth or nose? Yes Did you need to seek medical attention at a hospital or doctor's office? Unknown When did it last happen?Childhood If all above answers are "NO", may proceed with cephalosporin use.      Social History:   Social History   Socioeconomic History  . Marital status: Married    Spouse name: Not on file  . Number of children: Not on file  . Years of education: Not on file  . Highest education level: Not on file  Occupational History  . Not on file  Social Needs  . Financial resource strain: Not on file  . Food insecurity    Worry: Not on file    Inability: Not on file  . Transportation needs    Medical: Not on file    Non-medical: Not on file  Tobacco Use  . Smoking status: Former Smoker    Types: Pipe, Cigars    Quit date: 07/18/1978    Years since quitting: 40.6  . Smokeless tobacco: Never Used  Substance and Sexual Activity  . Alcohol use: No    Alcohol/week: 0.0 standard drinks  . Drug use: No  . Sexual activity: Not on file  Lifestyle  . Physical activity    Days per week: Not on file    Minutes per session: Not on file  . Stress: Not on file  Relationships  . Social Musician on phone: Not  on file    Gets together: Not on file    Attends religious service: Not on file    Active member of club or organization: Not on file    Attends meetings of clubs or organizations: Not on file    Relationship status: Not on file  . Intimate partner violence    Fear of current or ex partner: Not on file    Emotionally abused: Not on file    Physically abused: Not on file    Forced sexual activity: Not on file  Other Topics Concern  . Not on file  Social History Narrative   Married, lives with son in [place].  Family History:    Family History  Family history unknown: Yes     ROS:  Please see the history of present illness.   All other ROS reviewed and negative.     Physical Exam/Data:   Vitals:   03/12/19 0700 03/12/19 0758 03/12/19 0900 03/12/19 1103  BP: (!) 105/58  114/85   Pulse: (!) 56 (!) 56 75 78  Resp: 19 (!) 21 15 18   Temp:  98.4 F (36.9 C)  98.6 F (37 C)  TempSrc:  Oral  Oral  SpO2: 96% 98% 96% 100%  Weight:      Height:        Intake/Output Summary (Last 24 hours) at 03/12/2019 1114 Last data filed at 03/12/2019 0646 Gross per 24 hour  Intake 2061.13 ml  Output -  Net 2061.13 ml   Last 3 Weights 03/11/2019 03/11/2019 03/04/2019  Weight (lbs) 184 lb 4.9 oz 175 lb 183 lb 3.2 oz  Weight (kg) 83.6 kg 79.379 kg 83.099 kg     Body mass index is 23.66 kg/m.  General:  Elderly, frail, chronically ill appearing, resting comfortably. HEENT: normal Lymph: no adenopathy Neck: no JVD Endocrine:  No thryomegaly Cardiac:  normal S1, S2; RRR; 2/6 apical holosystolic murmur  Lungs:  clear to auscultation anteriorly Abd: soft Ext: no edema Skin: warm and dry    EKG:  The EKG was personally reviewed and demonstrates:  NSR with RBBB Telemetry:  Telemetry was personally reviewed and demonstrates:  Sinus rhythm with PVC's  Relevant CV Studies:  Echocardiogram 02/27/2019:   1. The left ventricle has mildly reduced systolic function, with an ejection  fraction of 45-50%. The cavity size was normal. Severe basal septal hypertrophy. Otherwise, mild LVH of remaining myocardium. Left ventricular diastolic function could not be  evaluated secondary to atrial fibrillation. Left ventricular diffuse hypokinesis.  2. Trivial pericardial effusion is present.  3. The tricuspid valve is grossly normal.  4. A 27 mm Edwards pericardial tissue valve is present in the AV position. Poorly visualized.  5. The aorta is abnormal in size and structure.  6. There is mild dilatation of the aortic root measuring 42 mm.  7. Left atrial size was severely dilated.  8. Right atrial size was mildly dilated.  9. A 36 mm Sorin memo 3D annuloplasty ring is present at the MV anulus.   Laboratory Data:  High Sensitivity Troponin:   Recent Labs  Lab 03/11/19 2019 03/12/19 0247  TROPONINIHS 43* 46*     Chemistry Recent Labs  Lab 03/11/19 1752 03/12/19 0247  NA 137 140  K 4.1 4.0  CL 105 116*  CO2 22 21*  GLUCOSE 172* 139*  BUN 74* 69*  CREATININE 1.52* 1.11  CALCIUM 9.0 8.1*  GFRNONAA 40* 59*  GFRAA 46* >60  ANIONGAP 10 3*    Recent Labs  Lab 03/11/19 1752 03/12/19 0247  PROT 6.8 5.2*  ALBUMIN 3.2* 2.4*  AST 38 27  ALT 24 21  ALKPHOS 60 45  BILITOT 3.0* 1.8*   Hematology Recent Labs  Lab 03/11/19 1752 03/12/19 0247  WBC 9.4 7.5  RBC 4.98 3.90*  HGB 15.2 11.8*  HCT 46.7 36.5*  MCV 93.8 93.6  MCH 30.5 30.3  MCHC 32.5 32.3  RDW 13.0 13.2  PLT 143* 101*   BNPNo results for input(s): BNP, PROBNP in the last 168 hours.  DDimer No results for input(s): DDIMER in the last 168 hours.   Radiology/Studies:  Ct Head Wo Contrast  Result Date:  03/11/2019 CLINICAL DATA:  Unwitnessed fall EXAM: CT HEAD WITHOUT CONTRAST CT CERVICAL SPINE WITHOUT CONTRAST TECHNIQUE: Multidetector CT imaging of the head and cervical spine was performed following the standard protocol without intravenous contrast. Multiplanar CT image reconstructions of the  cervical spine were also generated. COMPARISON:  06/07/2019 FINDINGS: CT HEAD FINDINGS Brain: There is atrophy and chronic small vessel disease changes. No acute intracranial abnormality. Specifically, no hemorrhage, hydrocephalus, mass lesion, acute infarction, or significant intracranial injury. Vascular: No hyperdense vessel or unexpected calcification. Skull: No acute calvarial abnormality. Sinuses/Orbits: Visualized paranasal sinuses and mastoids clear. Orbital soft tissues unremarkable. Other: No subluxation. CT CERVICAL SPINE FINDINGS Alignment: Normal. Skull base and vertebrae: No acute fracture. No primary bone lesion or focal pathologic process. Soft tissues and spinal canal: No prevertebral fluid or swelling. No visible canal hematoma. Disc levels:  Diffuse degenerative disc and facet disease. Upper chest: Airspace opacity partially imaged in the right upper lobe. Other: None IMPRESSION: Atrophy, chronic microvascular disease. No acute intracranial abnormality. No acute bony abnormality in the cervical spine. Cervical spondylosis. Airspace disease in the right upper lobe concerning for pneumonia as seen on chest x-ray. Electronically Signed   By: Charlett Nose M.D.   On: 03/11/2019 19:48   Ct Cervical Spine Wo Contrast  Result Date: 03/11/2019 CLINICAL DATA:  Unwitnessed fall EXAM: CT HEAD WITHOUT CONTRAST CT CERVICAL SPINE WITHOUT CONTRAST TECHNIQUE: Multidetector CT imaging of the head and cervical spine was performed following the standard protocol without intravenous contrast. Multiplanar CT image reconstructions of the cervical spine were also generated. COMPARISON:  06/07/2019 FINDINGS: CT HEAD FINDINGS Brain: There is atrophy and chronic small vessel disease changes. No acute intracranial abnormality. Specifically, no hemorrhage, hydrocephalus, mass lesion, acute infarction, or significant intracranial injury. Vascular: No hyperdense vessel or unexpected calcification. Skull: No acute calvarial  abnormality. Sinuses/Orbits: Visualized paranasal sinuses and mastoids clear. Orbital soft tissues unremarkable. Other: No subluxation. CT CERVICAL SPINE FINDINGS Alignment: Normal. Skull base and vertebrae: No acute fracture. No primary bone lesion or focal pathologic process. Soft tissues and spinal canal: No prevertebral fluid or swelling. No visible canal hematoma. Disc levels:  Diffuse degenerative disc and facet disease. Upper chest: Airspace opacity partially imaged in the right upper lobe. Other: None IMPRESSION: Atrophy, chronic microvascular disease. No acute intracranial abnormality. No acute bony abnormality in the cervical spine. Cervical spondylosis. Airspace disease in the right upper lobe concerning for pneumonia as seen on chest x-ray. Electronically Signed   By: Charlett Nose M.D.   On: 03/11/2019 19:48   Dg Pelvis Portable  Result Date: 03/11/2019 CLINICAL DATA:  85 year old presenting after a fall. LEFT hip pain. Initial encounter. EXAM: PORTABLE PELVIS 1-2 VIEWS COMPARISON:  None. FINDINGS: Acute comminuted intertrochanteric LEFT femoral neck fracture. LEFT hip joint anatomically aligned with mild axial joint space narrowing. Osseous demineralization. Symmetric mild axial joint space narrowing in the contralateral RIGHT hip. Bone island in the LOWER RIGHT acetabulum. Sacroiliac joints and symphysis pubis anatomically aligned without significant degenerative changes. Degenerative changes involving the visualized LOWER lumbar spine. IMPRESSION: Acute comminuted intertrochanteric LEFT femoral neck fracture. Electronically Signed   By: Hulan Saas M.D.   On: 03/11/2019 20:13   Dg Chest Portable 1 View  Result Date: 03/11/2019 CLINICAL DATA:  Fall EXAM: PORTABLE CHEST 1 VIEW COMPARISON:  02/26/2019 FINDINGS: Moderate cardiomegaly, unchanged. Remote median sternotomy. There are shallow inflation of the right lung with hazy airspace opacities. The left lung is clear. IMPRESSION: Shallow  inflation of the right lung with hazy airspace  opacities Electronically Signed   By: Deatra RobinsonKevin  Herman M.D.   On: 03/11/2019 20:30   Dg Foot Complete Left  Result Date: 03/11/2019 CLINICAL DATA:  Fall EXAM: LEFT FOOT-SINGLE VIEW COMPARISON:  None. FINDINGS: A single lateral view of the foot was obtained. This shows no acute fracture or dislocation. There are dorsal and posterior vascular calcifications. There is skin thickening of the left heel. No soft tissue gas or focal ulceration. IMPRESSION: Skin thickening of the left heel.  No soft tissue gas or ulceration. Electronically Signed   By: Deatra RobinsonKevin  Herman M.D.   On: 03/11/2019 20:24   Dg Hip Unilat W Or Wo Pelvis 2-3 Views Left  Result Date: 03/11/2019 CLINICAL DATA:  Fall EXAM: DG HIP (WITH OR WITHOUT PELVIS) 2-3V LEFT COMPARISON:  None. FINDINGS: Minimally displaced intertrochanteric fracture of the proximal left femur. No femoroacetabular dislocation. Limited visualization of the remainder of the pelvis. IMPRESSION: Minimally displaced intertrochanteric fracture of the left femur. Electronically Signed   By: Deatra RobinsonKevin  Herman M.D.   On: 03/11/2019 20:22    Assessment and Plan:   1.  Preoperative risk stratification: Patient has mild left ventricular dysfunction. He was not very mobile even prior to fracture. Given his advanced age and comorbidities, he is at least at an intermediate risk for a major adverse cardiac event in the perioperative period. His daughter will speak with orthopedic surgery before deciding whether or not to proceed with surgery.  2.  Atrial fibrillation: Listed as permanent but he appears to be in sinus rhythm with a right bundle branch block.  INR is supratherapeutic and he has been documented to be noncompliant with warfarin.  Pharmacy will manage this.  3.  Valvular heart disease: Bioprosthetic aortic valve and mitral valve repair appear to be durable and stable as per most recent echocardiogram performed on 02/27/2019.  No  further cardiac testing is indicated.  Will follow remotely.  For questions or updates, please contact [facility] Please consult [email or web address] for contact info under     Signed, Prentice DockerSuresh Koneswaran, MD  03/12/2019 11:14 AM

## 2019-03-12 NOTE — Progress Notes (Signed)
PROGRESS NOTE    Phillip Nolan  PPJ:[id number]  DOB: 19-Sep-1927  DOA: 03/11/2019 PCP: Maryruth Hancock, MD   Brief Admission Hx: 85 y/o male recently discharged from [facility], at the end stages of dementia, CAD, PAfib, h/o AVR and mitral valve repair, anticoagulated with warfarin had a fall on 8/22 and apparently was not able to ambulate for a prolonged period of time.  HHPT came out to see him and immediately recognized that he likely had a hip fracture and sent him to ED by EMS.  He was febrile, dehydrated and had sepsis physiology.  He was started on broad spectrum antibiotics for a presumed pneumonia (healthcare associated), UTI, supratherapeutic INR, unsafe home environment and hypoxia.    MDM/Assessment & Plan:   1. Sepsis secondary to presumed HCAP - DC vancomycin as MRSA screen negative. Continue cefepime and supportive care.  Recheck portable CXR in AM.  Follow blood cultures.  Lactate has normalized.  2. Acute respiratory failure with hypoxia - improved with supplemental oxygen, treating pneumonia as above.  3. UTI - I am not convinced this is a true infection, follow urine culture.   4. Hypotension - secondary to sepsis and dehydration, BPs improved with hydration.  5. Paroxysmal Atrial fibrillation - his HR is well controlled, he has a supratherapeutic INR which is rising, we gave a small dose of subcutaneous Vit K, asked pharm D to assist with warfarin dosing in hospital.  6.  AKI - prerenal - improved with IVF hydration.  7. Acute left intertrochanteric hip fracture - orthopedics consult requested.  Working to medically stabilize patient. I have asked for a cardiology consult for preoperative clearance.  8. Dementia - Pt appears to be at the end stages of his disease.  I feel that a palliative medicine consultation is warranted as daughter would like to have discussions about goals of care and end of life dignity.  This will be especially important if patient is not medically stable  enough to have hip surgery.    DVT prophylaxis: supratherapeutic INR Code Status: Full  Family Communication: daughter counseled at bedside  Disposition Plan: inpatient stepdown ICU, IV antibiotics  Consultants:  Orthopedics   cardiology  Procedures:    Antimicrobials:  Cefepime 8/24 >  Vancomycin 8/24 >   Subjective: Pt with dementia, he says he has pain in left hip.   Objective: Vitals:   03/12/19 0900 03/12/19 1100 03/12/19 1103 03/12/19 1200  BP: 114/85 105/67  123/72  Pulse: 75 60 78 69  Resp: 15 (!) 21 18 (!) 21  Temp:   98.6 F (37 C)   TempSrc:   Oral   SpO2: 96% 100% 100% 97%  Weight:      Height:        Intake/Output Summary (Last 24 hours) at 03/12/2019 1220 Last data filed at 03/12/2019 5809 Gross per 24 hour  Intake 2061.13 ml  Output --  Net 2061.13 ml   Filed Weights   03/11/19 1636 03/11/19 2338  Weight: 79.4 kg 83.6 kg   REVIEW OF SYSTEMS  UTO due to dementia   Exam:  General exam: pt appears comfortable, he is confused, does not want to be approached or bothered Respiratory system:  No increased work of breathing. Cardiovascular system: S1 & S2 heard. No JVD, murmurs, gallops, clicks or pedal edema. Gastrointestinal system: Abdomen is nondistended, soft and nontender. Normal bowel sounds heard. Central nervous system: Alert and with dementia. Pt would not cooperate for exam, moving all extremities. Extremities:  no clubbing or cyanosis.   Data Reviewed: Basic Metabolic Panel: Recent Labs  Lab 03/11/19 1752 03/12/19 0247  NA 137 140  K 4.1 4.0  CL 105 116*  CO2 22 21*  GLUCOSE 172* 139*  BUN 74* 69*  CREATININE 1.52* 1.11  CALCIUM 9.0 8.1*   Liver Function Tests: Recent Labs  Lab 03/11/19 1752 03/12/19 0247  AST 38 27  ALT 24 21  ALKPHOS 60 45  BILITOT 3.0* 1.8*  PROT 6.8 5.2*  ALBUMIN 3.2* 2.4*   No results for input(s): LIPASE, AMYLASE in the last 168 hours. No results for input(s): AMMONIA in the last 168  hours. CBC: Recent Labs  Lab 03/11/19 1752 03/12/19 0247  WBC 9.4 7.5  NEUTROABS 8.0*  --   HGB 15.2 11.8*  HCT 46.7 36.5*  MCV 93.8 93.6  PLT 143* 101*   Cardiac Enzymes: No results for input(s): CKTOTAL, CKMB, CKMBINDEX, TROPONINI in the last 168 hours. CBG (last 3)  No results for input(s): GLUCAP in the last 72 hours. Recent Results (from the past 240 hour(s))  SARS Coronavirus 2 [facility] order, Performed in [facility] hospital lab) Nasopharyngeal Nasopharyngeal Swab     Status: None   Collection Time: 03/11/19  5:36 PM   Specimen: Nasopharyngeal Swab  Result Value Ref Range Status   SARS Coronavirus 2 NEGATIVE NEGATIVE Final    Comment: (NOTE) If result is NEGATIVE SARS-CoV-2 target nucleic acids are NOT DETECTED. The SARS-CoV-2 RNA is generally detectable in upper and lower  respiratory specimens during the acute phase of infection. The lowest  concentration of SARS-CoV-2 viral copies this assay can detect is 250  copies / mL. A negative result does not preclude SARS-CoV-2 infection  and should not be used as the sole basis for treatment or other  patient management decisions.  A negative result may occur with  improper specimen collection / handling, submission of specimen other  than nasopharyngeal swab, presence of viral mutation(s) within the  areas targeted by this assay, and inadequate number of viral copies  (<250 copies / mL). A negative result must be combined with clinical  observations, patient history, and epidemiological information. If result is POSITIVE SARS-CoV-2 target nucleic acids are DETECTED. The SARS-CoV-2 RNA is generally detectable in upper and lower  respiratory specimens dur ing the acute phase of infection.  Positive  results are indicative of active infection with SARS-CoV-2.  Clinical  correlation with patient history and other diagnostic information is  necessary to determine patient infection status.  Positive results do  not rule  out bacterial infection or co-infection with other viruses. If result is PRESUMPTIVE POSTIVE SARS-CoV-2 nucleic acids MAY BE PRESENT.   A presumptive positive result was obtained on the submitted specimen  and confirmed on repeat testing.  While 2019 novel coronavirus  (SARS-CoV-2) nucleic acids may be present in the submitted sample  additional confirmatory testing may be necessary for epidemiological  and / or clinical management purposes  to differentiate between  SARS-CoV-2 and other Sarbecovirus currently known to infect humans.  If clinically indicated additional testing with an alternate test  methodology [phone]) is advised. The SARS-CoV-2 RNA is generally  detectable in upper and lower respiratory sp ecimens during the acute  phase of infection. The expected result is Negative. Fact Sheet for Patients:  BoilerBrush.com.cy Fact Sheet for Healthcare Providers: https://pope.com/ This test is not yet approved or cleared by the [place] FDA and has been authorized for detection and/or diagnosis of SARS-CoV-2 by FDA  under an Emergency Use Authorization (EUA).  This EUA will remain in effect (meaning this test can be used) for the duration of the COVID-19 declaration under Section 564(b)(1) of the Act, 21 U.S.C. section 360bbb-3(b)(1), unless the authorization is terminated or revoked sooner. Performed at [facility], [address], [place], [place] [address]   Blood Culture (routine x 2)     Status: None (Preliminary result)   Collection Time: 03/11/19  5:52 PM   Specimen: Right Antecubital; Blood  Result Value Ref Range Status   Specimen Description RIGHT ANTECUBITAL  Final   Special Requests   Final    BOTTLES DRAWN AEROBIC AND ANAEROBIC Blood Culture results may not be optimal due to an excessive volume of blood received in culture bottles   Culture   Final    NO GROWTH < 24 HOURS Performed at [facility], [address] [place], [place] [address]    Report Status PENDING  Incomplete  Blood Culture (routine x 2)     Status: None (Preliminary result)   Collection Time: 03/11/19  6:00 PM   Specimen: BLOOD LEFT WRIST  Result Value Ref Range Status   Specimen Description BLOOD LEFT WRIST  Final   Special Requests   Final    BOTTLES DRAWN AEROBIC AND ANAEROBIC Blood Culture adequate volume   Culture   Final    NO GROWTH < 24 HOURS Performed at [facility], [address], [place], [place] [address]    Report Status PENDING  Incomplete  MRSA PCR Screening     Status: None   Collection Time: 03/11/19 11:21 PM   Specimen: Nasal Mucosa; Nasopharyngeal  Result Value Ref Range Status   MRSA by PCR NEGATIVE NEGATIVE Final    Comment:        The GeneXpert MRSA Assay (FDA approved for NASAL specimens only), is one component of a comprehensive MRSA colonization surveillance program. It is not intended to diagnose MRSA infection nor to guide or monitor treatment for MRSA infections. Performed at [facility], [address], [place], [place] [address]      Studies: Ct Head Wo Contrast  Result Date: 03/11/2019 CLINICAL DATA:  Unwitnessed fall EXAM: CT HEAD WITHOUT CONTRAST CT CERVICAL SPINE WITHOUT CONTRAST TECHNIQUE: Multidetector CT imaging of the head and cervical spine was performed following the standard protocol without intravenous contrast. Multiplanar CT image reconstructions of the cervical spine were also generated. COMPARISON:  06/07/2019 FINDINGS: CT HEAD FINDINGS Brain: There is atrophy and chronic small vessel disease changes. No acute intracranial abnormality. Specifically, no hemorrhage, hydrocephalus, mass lesion, acute infarction, or significant intracranial injury. Vascular: No hyperdense vessel or unexpected calcification. Skull: No acute calvarial abnormality. Sinuses/Orbits: Visualized paranasal sinuses and mastoids clear. Orbital soft tissues unremarkable. Other: No subluxation. CT  CERVICAL SPINE FINDINGS Alignment: Normal. Skull base and vertebrae: No acute fracture. No primary bone lesion or focal pathologic process. Soft tissues and spinal canal: No prevertebral fluid or swelling. No visible canal hematoma. Disc levels:  Diffuse degenerative disc and facet disease. Upper chest: Airspace opacity partially imaged in the right upper lobe. Other: None IMPRESSION: Atrophy, chronic microvascular disease. No acute intracranial abnormality. No acute bony abnormality in the cervical spine. Cervical spondylosis. Airspace disease in the right upper lobe concerning for pneumonia as seen on chest x-ray. Electronically Signed   By: Charlett Nose M.D.   On: 03/11/2019 19:48   Ct Cervical Spine Wo Contrast  Result Date: 03/11/2019 CLINICAL DATA:  Unwitnessed fall EXAM: CT HEAD WITHOUT  CONTRAST CT CERVICAL SPINE WITHOUT CONTRAST TECHNIQUE: Multidetector CT imaging of the head and cervical spine was performed following the standard protocol without intravenous contrast. Multiplanar CT image reconstructions of the cervical spine were also generated. COMPARISON:  06/07/2019 FINDINGS: CT HEAD FINDINGS Brain: There is atrophy and chronic small vessel disease changes. No acute intracranial abnormality. Specifically, no hemorrhage, hydrocephalus, mass lesion, acute infarction, or significant intracranial injury. Vascular: No hyperdense vessel or unexpected calcification. Skull: No acute calvarial abnormality. Sinuses/Orbits: Visualized paranasal sinuses and mastoids clear. Orbital soft tissues unremarkable. Other: No subluxation. CT CERVICAL SPINE FINDINGS Alignment: Normal. Skull base and vertebrae: No acute fracture. No primary bone lesion or focal pathologic process. Soft tissues and spinal canal: No prevertebral fluid or swelling. No visible canal hematoma. Disc levels:  Diffuse degenerative disc and facet disease. Upper chest: Airspace opacity partially imaged in the right upper lobe. Other: None  IMPRESSION: Atrophy, chronic microvascular disease. No acute intracranial abnormality. No acute bony abnormality in the cervical spine. Cervical spondylosis. Airspace disease in the right upper lobe concerning for pneumonia as seen on chest x-ray. Electronically Signed   By: Charlett NoseKevin  Dover M.D.   On: 03/11/2019 19:48   Dg Pelvis Portable  Result Date: 03/11/2019 CLINICAL DATA:  85 year old presenting after a fall. LEFT hip pain. Initial encounter. EXAM: PORTABLE PELVIS 1-2 VIEWS COMPARISON:  None. FINDINGS: Acute comminuted intertrochanteric LEFT femoral neck fracture. LEFT hip joint anatomically aligned with mild axial joint space narrowing. Osseous demineralization. Symmetric mild axial joint space narrowing in the contralateral RIGHT hip. Bone island in the LOWER RIGHT acetabulum. Sacroiliac joints and symphysis pubis anatomically aligned without significant degenerative changes. Degenerative changes involving the visualized LOWER lumbar spine. IMPRESSION: Acute comminuted intertrochanteric LEFT femoral neck fracture. Electronically Signed   By: Hulan Saashomas  Lawrence M.D.   On: 03/11/2019 20:13   Dg Chest Portable 1 View  Result Date: 03/11/2019 CLINICAL DATA:  Fall EXAM: PORTABLE CHEST 1 VIEW COMPARISON:  02/26/2019 FINDINGS: Moderate cardiomegaly, unchanged. Remote median sternotomy. There are shallow inflation of the right lung with hazy airspace opacities. The left lung is clear. IMPRESSION: Shallow inflation of the right lung with hazy airspace opacities Electronically Signed   By: Deatra RobinsonKevin  Herman M.D.   On: 03/11/2019 20:30   Dg Foot Complete Left  Result Date: 03/11/2019 CLINICAL DATA:  Fall EXAM: LEFT FOOT-SINGLE VIEW COMPARISON:  None. FINDINGS: A single lateral view of the foot was obtained. This shows no acute fracture or dislocation. There are dorsal and posterior vascular calcifications. There is skin thickening of the left heel. No soft tissue gas or focal ulceration. IMPRESSION: Skin thickening  of the left heel.  No soft tissue gas or ulceration. Electronically Signed   By: Deatra RobinsonKevin  Herman M.D.   On: 03/11/2019 20:24   Dg Hip Unilat W Or Wo Pelvis 2-3 Views Left  Result Date: 03/11/2019 CLINICAL DATA:  Fall EXAM: DG HIP (WITH OR WITHOUT PELVIS) 2-3V LEFT COMPARISON:  None. FINDINGS: Minimally displaced intertrochanteric fracture of the proximal left femur. No femoroacetabular dislocation. Limited visualization of the remainder of the pelvis. IMPRESSION: Minimally displaced intertrochanteric fracture of the left femur. Electronically Signed   By: Deatra RobinsonKevin  Herman M.D.   On: 03/11/2019 20:22     Scheduled Meds:  Chlorhexidine Gluconate Cloth  6 each Topical Daily   Continuous Infusions:  sodium chloride     ceFEPime (MAXIPIME) IV Stopped (03/12/19 0640)    Principal Problem:   Sepsis (HCC) Active Problems:   Essential hypertension, benign   Chronic atrial  fibrillation   Acute lower UTI   ARF (acute renal failure) (HCC)   HCAP (healthcare-associated pneumonia)   Pressure injury of skin  Critical care Time spent: 40 mins  Standley Dakinslanford Tori Dattilio, MD [facility] 03/12/2019, 12:20 PM    LOS: 1 day  How to contact the [facility] Attending or Consulting provider 7A - 7P or covering provider during after hours 7P -7A, for this patient?  1. Check the care team in [facility] and look for a) attending/consulting [facility] provider listed and b) the [facility] team listed 2. Log into [email or web address] and use [place]'s universal password to access. If you do not have the password, please contact the hospital operator. 3. Locate the [facility] provider you are looking for under [facility] and page to a number that you can be directly reached. 4. If you still have difficulty reaching the provider, please page the [facility] (Director on Call) for the Hospitalists listed on amion for assistance.

## 2019-03-12 NOTE — Consult Note (Signed)
Consultation Note Date: 03/12/2019   Patient Name: Phillip Nolan  DOB: 05-16-1928  MRN: [id number]  Age / Sex: 85 y.o., male  PCP: Maryruth Hancock, MD Referring Physician: Murlean Iba, MD  Reason for Consultation: Establishing goals of care  HPI/Patient Profile: 85 y.o. male  with past medical history of dementia with behavior disturbances, CAD, a fib, AVR and mitral valve repair (on anticoag), has visual and auditory hallucinations, recent admission for staph infection of skin/altered mental status possible related to UTI admitted on 03/11/2019 with urosepsis and hip fracture. Palliative medicine consulted for [place].   Clinical Assessment and Goals of Care: Evaluated patient at bedside. He was awake and alert. He is able to tell me his name, place and year. However, cannot answer questions related to his health. When [place] discussion he makes irrelevant comments related to this provider's presence and physical features.  He notes pain in his 'liver' and hip.  I spoke with his daughter- Phillip Nolan- [name] via phone.  Benjamine Mola was traveling in a difficult coverage area and the call was dropped several times.  She states that patient would not want to be on "machines" however, extended [place] discussion and code status could not be discussed due to poor cellular service.  She described patient's declining status and her stress over trying to get him moved closer to her. Her preference would be to get him transferred to a facility near her home in [place], [place].    Primary Decision Maker HCPOA- daughter- [place] made with patient's daughter to meet in person at 10am tomorrow morning -Continue full scope care for now    Palliative Prophylaxis:   Delirium Protocol  Prognosis:    Unable to determine  Discharge Planning: To  Be Determined  Primary Diagnoses: Present on Admission: . Sepsis ([place]) . Acute lower UTI . Essential hypertension, benign . Chronic atrial fibrillation . ARF (acute renal failure) ([place]) . HCAP (healthcare-associated pneumonia)   I have reviewed the medical record, interviewed the patient and family, and examined the patient. The following aspects are pertinent.  Past Medical History:  Diagnosis Date  . Aortic valve disease    Status post AVR 2011  . Atrial fibrillation ([place])   . BPH (benign prostatic hyperplasia)   . CHF (congestive heart failure) ([place])   . Coronary atherosclerosis of native coronary artery    Nonobstructive  . Dementia ([place])   . Essential hypertension   . History of bacteremia    Streptococcus gordonii bacteremia 2/11 - no clear vegetation by TEE  . HOH (hard of hearing)   . Mitral valve disease    Status post annular repair 2011   Social History   Socioeconomic History  . Marital status: Married    Spouse name: Not on file  . Number of children: Not on file  . Years of education: Not on file  . Highest education level: Not on file  Occupational History  . Not on file  Social Needs  . Financial resource strain: Not on file  . Food insecurity    Worry: Not on file    Inability: Not on file  . Transportation needs    Medical: Not on file    Non-medical: Not on file  Tobacco Use  . Smoking status: Former Smoker    Types: Pipe, Cigars    Quit date: 07/18/1978    Years since quitting: 40.6  . Smokeless tobacco: Never Used  Substance and Sexual Activity  . Alcohol use: No    Alcohol/week: 0.0 standard drinks  . Drug use: No  . Sexual activity: Not on file  Lifestyle  . Physical activity    Days per week: Not on file    Minutes per session: Not on file  . Stress: Not on file  Relationships  . Social Musicianconnections    Talks on phone: Not on file    Gets together: Not on file    Attends religious service: Not on file    Active member of club  or organization: Not on file    Attends meetings of clubs or organizations: Not on file    Relationship status: Not on file  Other Topics Concern  . Not on file  Social History Narrative   Married, lives [place].    Family History  Family history unknown: Yes   Scheduled Meds: . Chlorhexidine Gluconate Cloth  6 each Topical Daily   Continuous Infusions: . sodium chloride 100 mL/hr at 03/12/19 1230  . ceFEPime (MAXIPIME) IV Stopped (03/12/19 0640)   PRN Meds:.acetaminophen **OR** acetaminophen Medications Prior to Admission:  Prior to Admission medications   Medication Sig Start Date End Date Taking? Authorizing Provider  acetaminophen (TYLENOL) 325 MG tablet Take 2 tablets (650 mg total) by mouth every 4 (four) hours as needed for headache or mild pain. 02/28/19  Yes Emokpae, Courage, MD  lisinopril (ZESTRIL) 2.5 MG tablet Take 1 tablet daily Patient taking differently: Take 2.5 mg by mouth daily.  02/28/19  Yes Shon HaleEmokpae, Courage, MD  warfarin (COUMADIN) 5 MG tablet Take 1.5 tablets (7.5 mg total) by mouth every morning. TAKE 1&1/2 TABLETS DAILY or as directed Patient taking differently: Take 7.5 mg by mouth every evening.  03/01/19  Yes Shon HaleEmokpae, Courage, MD   Allergies  Allergen Reactions  . Ciprofloxacin     Daughter reports caused more confusion, and INR to be abnormal  . Penicillins Rash    .Did it involve swelling of the face/tongue/throat, SOB, or low BP? {No Did it involve sudden or severe rash/hives, skin peeling, or any reaction on the inside of your mouth or nose? Yes Did you need to seek medical attention at a hospital or doctor's office? Unknown When did it last happen?Childhood If all above answers are "NO", may proceed with cephalosporin use.     Review of Systems  Unable to perform ROS: Dementia    Physical Exam Vitals signs and nursing note reviewed.  Cardiovascular:     Rate and Rhythm: Normal rate.  Pulmonary:     Effort: Pulmonary  effort is normal.  Neurological:     Mental Status: He is alert and oriented to person, place, and time.     Vital Signs: BP 113/64   Pulse (!) 55   Temp 98.6 F (37 C) (Oral)   Resp (!) 22   Ht 6\' 2"  (1.88 m)   Wt 83.6 kg   SpO2 98%   BMI 23.66 kg/m  Pain Scale: PAINAD  Pain Score: 0-No pain   SpO2: SpO2: 98 % O2 Device:SpO2: 98 % O2 Flow Rate: .O2 Flow Rate (L/min): 2 L/min  IO: Intake/output summary:   Intake/Output Summary (Last 24 hours) at 03/12/2019 1559 Last data filed at 03/12/2019 1507 Gross per 24 hour  Intake 3312.61 ml  Output -  Net 3312.61 ml    LBM:   Baseline Weight: Weight: 79.4 kg Most recent weight: Weight: 83.6 kg     Palliative Assessment/Data: PPS: 20%     Thank you for this consult. Palliative medicine will continue to follow and assist as needed.   Time In: 1600 Time Out: 1715 Time Total: 75 minutes Greater than 50%  of this time was spent counseling and coordinating care related to the above assessment and plan.  Signed by: Ocie Bob, AGNP-C Palliative Medicine    Please contact Palliative Medicine Team phone at [phone] for questions and concerns.  For individual provider: See Loretha Stapler

## 2019-03-12 NOTE — Telephone Encounter (Signed)
Noted  

## 2019-03-12 NOTE — TOC Initial Note (Signed)
Transition of Care [facility]) - Initial/Assessment Note    Patient Details  Name: Phillip Nolan MRN: [id number] Date of Birth: 1927/12/23  Transition of Care [facility]) CM/SW Contact:    Boneta Lucks, RN   Patient know to [facility], admitted for sepsis and fall. Pt has hip fracture. Benjamine Mola - daughter lives in  [place], [place]. [facility] waiting to see if patient is a surgical candidate. Benjamine Mola is wanting memory care unit near her. Advised her we care waiting on Cardiology, Surgeon and PT evaluation.  She is wanting a conference call with her husband.  TOC will update Benjamine Mola tomorrow after progression. Expected Discharge Plan: Memory Care Barriers to Discharge: Family Issues, Continued Medical Work up   Patient Goals and CMS Choice Patient states their goals for this hospitalization and ongoing recovery are:: family wants patient to go to [place].      Expected Discharge Plan and Services Expected Discharge Plan: Memory Care In-house Referral: Clinical Social Work    Prior Living Arrangements/Services   Lives with:: Relatives              Current home services: DME, Home PT, Home RN, Other (comment)(social worker)    Activities of Daily Living Home Assistive Devices/Equipment: Environmental consultant (specify type) ADL Screening (condition at time of admission) Patient's cognitive ability adequate to safely complete daily activities?: No Is the patient deaf or have difficulty hearing?: Yes Does the patient have difficulty seeing, even when wearing glasses/contacts?: Yes Does the patient have difficulty concentrating, remembering, or making decisions?: Yes Patient able to express need for assistance with ADLs?: No Does the patient have difficulty dressing or bathing?: No Independently performs ADLs?: No Communication: Needs assistance Is this a change from baseline?: Pre-admission baseline Dressing (OT): Needs assistance Is this a change from baseline?: Pre-admission  baseline Grooming: Needs assistance Is this a change from baseline?: Pre-admission baseline Feeding: Needs assistance Is this a change from baseline?: Pre-admission baseline Bathing: Dependent Is this a change from baseline?: Pre-admission baseline Toileting: Needs assistance Is this a change from baseline?: Pre-admission baseline In/Out Bed: Dependent Is this a change from baseline?: Pre-admission baseline Walks in Home: Dependent Is this a change from baseline?: Pre-admission baseline Does the patient have difficulty walking or climbing stairs?: Yes Weakness of Legs: Both Weakness of Arms/Hands: Both  Permission Sought/Granted            Permission granted to share info w Relationship: [place]     Admission diagnosis:  Lower urinary tract infection [N39.0] HCAP (healthcare-associated pneumonia) [J18.9] Closed fracture of left hip, initial encounter ([place]) [S72.002A] Traumatic hematoma of left foot, initial encounter [S90.32XA] Sepsis with acute renal failure and septic shock, due to unspecified organism, unspecified acute renal failure type ([place]) [A41.9, R65.21, N17.9] Patient Active Problem List   Diagnosis Date Noted  . Pressure injury of skin 03/12/2019  . Sepsis ([place]) 03/11/2019  . Acute lower UTI 03/11/2019  . ARF (acute renal failure) ([place]) 03/11/2019  . HCAP (healthcare-associated pneumonia) 03/11/2019  . Acute metabolic encephalopathy 62/83/1517  . Confusion 02/26/2019  . Hematuria 02/26/2019  . Dementia ([place])   . HOH (hard of hearing)   . Mitral valve disease   . BPH (benign prostatic hyperplasia)   . Bradycardia from beta blocker   . Hallucinations   . Secondary hypercoagulable state ([place])   . Encounter for therapeutic drug monitoring 08/27/2013  . Long term (current) use of anticoagulants 11/30/2010  . CORONARY ATHEROSCLEROSIS NATIVE CORONARY ARTERY 01/01/2010  . MITRAL VALVE DISORDER  01/01/2010  . Aortic valve disorder 01/01/2010  .  Essential hypertension, benign 09/11/2009  . Chronic atrial fibrillation 09/11/2009   PCP:  Wandra Feinsteinorum, Lisa L, MD Pharmacy:   [facility] [place], [place] - [address] [place] [id number] Phone: [phone] Fax: [phone]        Readmission Risk Interventions No flowsheet data found.

## 2019-03-12 NOTE — Progress Notes (Signed)
Patient ID: Phillip Nolan, male   DOB: Dec 09, 1927, 85 y.o.   MRN: [id number]  Chief Complaint  Patient presents with  . Shortness of Breath    Past Medical History:  Diagnosis Date  . Aortic valve disease    Status post AVR 2011  . Atrial fibrillation ([place])   . BPH (benign prostatic hyperplasia)   . CHF (congestive heart failure) ([place])   . Coronary atherosclerosis of native coronary artery    Nonobstructive  . Dementia ([place])   . Essential hypertension   . History of bacteremia    Streptococcus gordonii bacteremia 2/11 - no clear vegetation by TEE  . HOH (hard of hearing)   . Mitral valve disease    Status post annular repair 2548    85 year old male with sepsis multiple cardiac issues fractured his hip need surgery.  I will do a consult if the surgery is done here otherwise I will stay out of the ICU at this time  I will keep track of his course and when he is cleared medically we will reassess of his surgery can be done here.

## 2019-03-12 NOTE — Progress Notes (Signed)
ANTICOAGULATION CONSULT NOTE - Initial Up Consult   Pharmacy Consult for warfarin dosing  Indication: atrial fibrillation   Allergies  Allergen Reactions  . Ciprofloxacin     Daughter reports caused more confusion, and INR to be abnormal  . Penicillins Rash    .Did it involve swelling of the face/tongue/throat, SOB, or low BP? {No Did it involve sudden or severe rash/hives, skin peeling, or any reaction on the inside of your mouth or nose? Yes Did you need to seek medical attention at a hospital or doctor's office? Unknown When did it last happen?Childhood If all above answers are "NO", may proceed with cephalosporin use.        Patient Measurements: Last Weight  Most recent update: 03/11/2019 11:39 PM   Weight  83.6 kg (184 lb 4.9 oz)           Body mass index is 23.66 kg/m. Phillip Nolan               Temp: 97.2 F (36.2 C) (08/25 0400) Temp Source: Axillary (08/25 0400) BP: 101/71 (08/24 2230) Pulse Rate: 82 (08/24 2230)  Labs: Recent Labs    03/11/19 1752 03/12/19 0247  HGB 15.2 11.8*  HCT 46.7 36.5*  PLT 143* 101*  APTT 40*  --   LABPROT 53.5*  --   INR 6.1*  --   CREATININE 1.52* 1.11    Estimated Creatinine Clearance: 52.5 mL/min (by C-G formula based on SCr of 1.11 mg/dL).     Medications:  Medications Prior to Admission  Medication Sig Dispense Refill Last Dose  . acetaminophen (TYLENOL) 325 MG tablet Take 2 tablets (650 mg total) by mouth every 4 (four) hours as needed for headache or mild pain. 12 tablet 1 03/10/2019 at Unknown time  . lisinopril (ZESTRIL) 2.5 MG tablet Take 1 tablet daily (Patient taking differently: Take 2.5 mg by mouth daily. ) 30 tablet 2 03/10/2019 at Unknown time  . warfarin (COUMADIN) 5 MG tablet Take 1.5 tablets (7.5 mg total) by mouth every morning. TAKE 1&1/2 TABLETS DAILY or as directed (Patient taking differently: Take 7.5 mg by mouth every evening. ) 45 tablet 1 03/10/2019 at 1800   Scheduled:  .  Chlorhexidine Gluconate Cloth  6 each Topical Daily   Infusions:  . sodium chloride 1,000 mL (03/12/19 0319)  . ceFEPime (MAXIPIME) IV    . vancomycin 750 mg (03/12/19 0031)   PRN: acetaminophen **OR** acetaminophen Anti-infectives (From admission, onward)   Start     Dose/Rate Route Frequency Ordered Stop   03/12/19 0600  ceFEPIme (MAXIPIME) 2 g in sodium chloride 0.9 % 100 mL IVPB     2 g 200 mL/hr over 30 Minutes Intravenous Every 12 hours 03/11/19 1806     03/12/19 0100  vancomycin (VANCOCIN) IVPB 750 mg/150 ml premix     750 mg 150 mL/hr over 60 Minutes Intravenous Every 12 hours 03/11/19 1806     03/11/19 1745  ceFEPIme (MAXIPIME) 2 g in sodium chloride 0.9 % 100 mL IVPB     2 g 200 mL/hr over 30 Minutes Intravenous  Once 03/11/19 1735 03/11/19 1941   03/11/19 1745  metroNIDAZOLE (FLAGYL) IVPB 500 mg     500 mg 100 mL/hr over 60 Minutes Intravenous  Once 03/11/19 1735 03/11/19 2122   03/11/19 1745  vancomycin (VANCOCIN) IVPB 1000 mg/200 mL premix     1,000 mg 200 mL/hr over 60 Minutes Intravenous  Once 03/11/19 1735 03/11/19 2122  Goal of Therapy:  INR 2-3 Monitor platelets by anticoagulation protocol: Yes    Prior to Admission Warfarin Dosing:  Phillip Nolan takes 7.5mg mg of warfarin daily      Admit INR was 6.1 Lab Results  Component Value Date   INR 6.1 (HH) 03/11/2019   INR 1.5 (A) 03/08/2019   INR 4.7 (A) 03/04/2019    Assessment: Phillip Nolan a 85 y.o. male requires anticoagulation with warfarin for the indication of  atrial fibrillation. Warfarin will be initiated inpatient following pharmacy protocol per pharmacy consult. Patient most recent blood work is as follows: CBC Latest Ref Rng & Units 03/12/2019 03/11/2019 02/28/2019  WBC 4.0 - 10.5 K/uL 7.5 9.4 7.2  Hemoglobin 13.0 - 17.0 g/dL 11.8(L) 15.2 16.1  Hematocrit 39.0 - 52.0 % 36.5(L) 46.7 51.1  Platelets 150 - 400 K/uL 101(L) 143(L) 151     Plan: Hold warfarin for  supratherapeutic INR  Monitor CBC daily with am labs   Monitor INR daily Monitor for signs and symptoms of bleeding   Gerre PebblesGarrett Caleen Taaffe, PharmD, MBA, BCGP Clinical Pharmacist

## 2019-03-12 NOTE — Progress Notes (Signed)
CRITICAL VALUE ALERT  Critical Value:  INR 7.7  Date & Time Notied:  03/12/19 6226  Provider Notified: Dr. Wynetta Emery   Orders Received/Actions taken: See chart

## 2019-03-12 NOTE — Progress Notes (Signed)
ANTICOAGULATION CONSULT NOTE - Follow Up Consult  Pharmacy Consult for warfarin dosing Indication: chronic atrial fibrillation/ aortic valve disease   Patient Measurements: Height: 6\' 2"  (188 cm) Weight: 184 lb 4.9 oz (83.6 kg) IBW/kg (Calculated) : 82.2 Heparin Dosing Weight: HEPARIN DW (KG): 83.6  Vital Signs: Temp: 98.4 F (36.9 C) (08/25 0758) Temp Source: Oral (08/25 0758) BP: 114/85 (08/25 0900) Pulse Rate: 75 (08/25 0900)  Labs: Recent Labs    03/11/19 1752 03/11/19 2019 03/12/19 0247 03/12/19 0739  HGB 15.2  --  11.8*  --   HCT 46.7  --  36.5*  --   PLT 143*  --  101*  --   APTT 40*  --   --   --   LABPROT 53.5*  --   --  63.9*  INR 6.1*  --   --  7.7*  CREATININE 1.52*  --  1.11  --   TROPONINIHS  --  43* 46*  --     Estimated Creatinine Clearance: 52.5 mL/min (by C-G formula based on SCr of 1.11 mg/dL).   Assessment: Pharmacy consulted to dose warfarin for this 85 yo male on chronic warfarin with hip fracture and severe dementia  He is followed in an outpatient anti-coagulation clinic and his   INR was 4.7 on 03-04-19 (on 57.5mg /week). At that time, he was told to take warfarin 7.5mg  daily (52.5mg /week).     Drug Interactions: cefepime; one dose of IV Flagyl in ED 8/24 -had been on Cipro in past 2 weeks    INR: 7.7 today (up by 1.6 from 8-24)   Alb: 2.4 Total bili: 1.8 ALT/AST 27/21 Diet: pt has been NPO for several days and has dementia (forgets to eat)  Goal of Therapy:  INR 2-3 Monitor platelets by anticoagulation protocol: Yes   Plan:  Pt received Vit K 5mg  sub-q x1 today--> poss hip surgery when stable Hold warfarin today for INR 7.7 Re-initiate warfarin at 10-20% decrease --> 42.5--47.5mg  weekly or lower, since pt isn't eating   Despina Pole 03/12/2019,9:45 AM

## 2019-03-12 NOTE — Telephone Encounter (Signed)
Pts daughter called wanting to let Dr. Holly Bodily know that pt is in the [place] icu and wanted to kind of touch base with Dr Holly Bodily to see what she felt about the situation. Would like a call back

## 2019-03-13 ENCOUNTER — Telehealth: Payer: Self-pay | Admitting: Orthopedic Surgery

## 2019-03-13 ENCOUNTER — Telehealth: Payer: Self-pay | Admitting: Family Medicine

## 2019-03-13 ENCOUNTER — Inpatient Hospital Stay (HOSPITAL_COMMUNITY): Payer: Medicare Other

## 2019-03-13 DIAGNOSIS — A419 Sepsis, unspecified organism: Secondary | ICD-10-CM

## 2019-03-13 DIAGNOSIS — J181 Lobar pneumonia, unspecified organism: Secondary | ICD-10-CM

## 2019-03-13 DIAGNOSIS — D696 Thrombocytopenia, unspecified: Secondary | ICD-10-CM

## 2019-03-13 DIAGNOSIS — N390 Urinary tract infection, site not specified: Secondary | ICD-10-CM

## 2019-03-13 LAB — COMPREHENSIVE METABOLIC PANEL
ALT: 21 U/L (ref 0–44)
AST: 21 U/L (ref 15–41)
Albumin: 2.5 g/dL — ABNORMAL LOW (ref 3.5–5.0)
Alkaline Phosphatase: 48 U/L (ref 38–126)
Anion gap: 6 (ref 5–15)
BUN: 45 mg/dL — ABNORMAL HIGH (ref 8–23)
CO2: 21 mmol/L — ABNORMAL LOW (ref 22–32)
Calcium: 8.5 mg/dL — ABNORMAL LOW (ref 8.9–10.3)
Chloride: 116 mmol/L — ABNORMAL HIGH (ref 98–111)
Creatinine, Ser: 0.72 mg/dL (ref 0.61–1.24)
GFR calc Af Amer: >60 mL/min (ref >60)
GFR calc non Af Amer: >60 mL/min (ref >60)
Glucose, Bld: 104 mg/dL — ABNORMAL HIGH (ref 70–99)
Potassium: 4.1 mmol/L (ref 3.5–5.1)
Sodium: 143 mmol/L (ref 135–145)
Total Bilirubin: 1.9 mg/dL — ABNORMAL HIGH (ref 0.3–1.2)
Total Protein: 5.5 g/dL — ABNORMAL LOW (ref 6.5–8.1)

## 2019-03-13 LAB — CBC
HCT: 37.3 % — ABNORMAL LOW (ref 39.0–52.0)
Hemoglobin: 12 g/dL — ABNORMAL LOW (ref 13.0–17.0)
MCH: 30.3 pg (ref 26.0–34.0)
MCHC: 32.2 g/dL (ref 30.0–36.0)
MCV: 94.2 fL (ref 80.0–100.0)
Platelets: 109 10*3/uL — ABNORMAL LOW (ref 150–400)
RBC: 3.96 MIL/uL — ABNORMAL LOW (ref 4.22–5.81)
RDW: 13.2 % (ref 11.5–15.5)
WBC: 7 10*3/uL (ref 4.0–10.5)
nRBC: 0 % (ref 0.0–0.2)

## 2019-03-13 LAB — URINE CULTURE: Culture: NO GROWTH

## 2019-03-13 LAB — PROTIME-INR
INR: 4 — ABNORMAL HIGH (ref 0.8–1.2)
Prothrombin Time: 38.7 seconds — ABNORMAL HIGH (ref 11.4–15.2)

## 2019-03-13 LAB — MAGNESIUM: Magnesium: 2.4 mg/dL (ref 1.7–2.4)

## 2019-03-13 LAB — PROCALCITONIN: Procalcitonin: 0.68 ng/mL

## 2019-03-13 MED ORDER — HYDROCODONE-ACETAMINOPHEN 5-325 MG PO TABS
1.0000 | ORAL_TABLET | Freq: Three times a day (TID) | ORAL | Status: DC | PRN
  Administered 2019-03-13 – 2019-03-14 (×3): 1 via ORAL
  Filled 2019-03-13 (×3): qty 1

## 2019-03-13 MED ORDER — SODIUM CHLORIDE 0.9 % IV SOLN
2.0000 g | Freq: Three times a day (TID) | INTRAVENOUS | Status: DC
  Administered 2019-03-13 – 2019-03-14 (×5): 2 g via INTRAVENOUS
  Filled 2019-03-13 (×5): qty 2

## 2019-03-13 MED ORDER — ACETAMINOPHEN 325 MG PO TABS
650.0000 mg | ORAL_TABLET | Freq: Three times a day (TID) | ORAL | Status: DC
  Administered 2019-03-13 – 2019-03-15 (×4): 650 mg via ORAL
  Filled 2019-03-13 (×5): qty 2

## 2019-03-13 MED ORDER — ONDANSETRON HCL 4 MG/2ML IJ SOLN: 4.0000 mg | Freq: Four times a day (QID) | INTRAMUSCULAR | Status: DC | PRN

## 2019-03-13 NOTE — TOC Progression Note (Addendum)
Transition of Care [facility]) - Progression Note    Patient Details  Name: Macari Zalesky MRN: [id number] Date of Birth: 1928/02/20  Transition of Care [facility]) CM/SW Contact  Boneta Lucks, RN Phone Number: 03/13/2019, 1:19 PM  Clinical Narrative:    Palliative Consult done.Benjamine Mola made Patient DNR. Benjamine Mola states they are filling for Medicaid today. She thinks it will be easier to keep him in [place],his doctors are here and family here can visit once allowed. FL2 started/ create note with updates. Need PT to evaluate, TOC  following for surgery.   Addendum: CM spoke with Gerald Stabs at [facility], [place] about this patient.     Expected Discharge Plan: Skilled Nursing Facility Barriers to Discharge: Family Issues, Continued Medical Work up  Expected Discharge Plan and Services Expected Discharge Plan: [place] In-house Referral: Clinical Social Work       Readmission Risk Interventions No flowsheet data found.

## 2019-03-13 NOTE — Progress Notes (Signed)
ANTICOAGULATION CONSULT NOTE - Follow Up Consult  Pharmacy Consult for: warfarin dosing Indication: chronic atrial fibrillation/ aortic valve disease   Patient Measurements: Height: 6\' 2"  (188 cm) Weight: 187 lb 2.7 oz (84.9 kg) IBW/kg (Calculated) : 82.2 Heparin Dosing Weight: HEPARIN DW (KG): 83.6  Vital Signs: Temp: 98 F (36.7 C) (08/26 0400) Temp Source: Oral (08/26 0758) BP: 145/79 (08/26 0830) Pulse Rate: 89 (08/26 0830)  Labs: Recent Labs    03/11/19 1752 03/11/19 2019 03/12/19 0247 03/12/19 0739 03/13/19 0501  HGB 15.2  --  11.8*  --  12.0*  HCT 46.7  --  36.5*  --  37.3*  PLT 143*  --  101*  --  109*  APTT 40*  --   --   --   --   LABPROT 53.5*  --   --  63.9* 38.7*  INR 6.1*  --   --  7.7* 4.0*  CREATININE 1.52*  --  1.11  --  0.72  TROPONINIHS  --  43* 46*  --   --     Estimated Creatinine Clearance: 72.8 mL/min (by C-G formula based on SCr of 0.72 mg/dL).   Assessment: Pharmacy consulted to dose warfarin for this 85 yo male on chronic warfarin with hip fracture and severe dementia. His INR has dropped nicely due to  Vit K 5mg  sub-q x1 yesterday.   Drug Interactions: cefepime; one dose of IV Flagyl in ED 8/24 -had been on Cipro in past 2 weeks    INR: 4.0 today    Alb: 2.4 Total bili: 1.9 ALT/AST 21/21 Diet: pt has been NPO for several days and has dementia (forgets to eat)  Goal of Therapy:  INR 2-3 Monitor platelets by anticoagulation protocol: Yes   Plan:    Hold warfarin today for INR 4.0 Re-initiate warfarin at 10-20% decrease --> 42.5--47.5mg  weekly or lower,     Despina Pole, Pharm. D. Clinical Pharmacist 03/13/2019 9:44 AM

## 2019-03-13 NOTE — Care Management Important Message (Signed)
Important Message  Patient Details  Name: Phillip Nolan MRN: [id number] Date of Birth: 10-19-1927   Medicare Important Message Given:  Yes     Tommy Medal 03/13/2019, 2:19 PM

## 2019-03-13 NOTE — Progress Notes (Signed)
Daily Progress Note   Patient Name: Phillip Nolan       Date: 03/13/2019 DOB: 1928/04/02  Age: 85 y.o. MRN#: [id number] Attending Physician: Orson Eva, MD Primary Care Physician: Maryruth Hancock, MD Admit Date: 03/11/2019  Reason for Consultation/Follow-up: Establishing goals of care  Subjective: Met with patient's daughter at bedside.  Patient was somnolent, but RN reports he was awake and alert this morning, ate good amount of breakfast. Pt with furrowed brow, but does not acknowledge pain.  Life history was reviewed- Benjamine Mola describes patient as strong and stubborn. 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 He has had decline in the last several years since his spouse and son and have died.  Palliative medicine was introduced and defined.  GOC and EOL wishes were discussed.  Current illness and possible trajectories were reviewed.  Benjamine Mola is hoping that her father recovers from pneumonia. Her EOL wish would be for him to "close his eyes and go to sleep" but states "I can't push him out of here". She notes she would not want his life prolonged with extraordinary measures,no mechanical ventilation or tube feedings. We discussed code status and she agreed to DNR code status.  Surgery for hip fracture was discussed. We discussed GOC for hip fracture- pain reduction, improved or return to mobility vs risks- worsening dementia, cardiac issues, death. Benjamine Mola expressed she would not want to risk death or worsening his dementia. We discussed pain could be managed with medications. Benjamine Mola does understand he is at high risk for decline even going to a rehab facility.  We discussed if he were to decline further after going to facility what her [place] would be- she notes that she would  not want him "lying in a bed, not recognizing anyone"- she states at that point she would consider transitions to more comfort measure path if he developed some type of infection and supporting through natural death.    Review of Systems  Unable to perform ROS: Dementia  All other systems reviewed and are negative.   Length of Stay: 2  Current Medications: Scheduled Meds:  . Chlorhexidine Gluconate Cloth  6 each Topical Daily    Continuous Infusions: . ceFEPime (MAXIPIME) IV      PRN Meds: acetaminophen **OR** acetaminophen, ondansetron (ZOFRAN) IV  Physical Exam  Vitals signs and nursing note reviewed.  Constitutional:      Appearance: He is ill-appearing.  Cardiovascular:     Rate and Rhythm: Normal rate.  Pulmonary:     Effort: Pulmonary effort is normal.  Neurological:     Mental Status: He is disoriented.     Comments: lethargic  Psychiatric:     Comments: Less verbal today             Vital Signs: BP (!) 145/79   Pulse 89   Temp 98 F (36.7 C) (Oral)   Resp 20   Ht _0  (1.88 m)   Wt 84.9 kg   SpO2 100%   BMI 24.03 kg/m  SpO2: SpO2: 100 % O2 Device: O2 Device: Room Air O2 Flow Rate: O2 Flow Rate (L/min): 2 L/min  Intake/output summary:   Intake/Output Summary (Last 24 hours) at 03/13/2019 1129 Last data filed at 03/13/2019 0800 Gross per 24 hour  Intake 2595.93 ml  Output 750 ml  Net 1845.93 ml   LBM:   Baseline Weight: Weight: 79.4 kg Most recent weight: Weight: 84.9 kg       Palliative Assessment/Data: PPS: 30%      Patient Active Problem List   Diagnosis Date Noted  . Sepsis due to undetermined organism ([place]) 03/13/2019  . Lobar pneumonia ([place]) 03/13/2019  . Thrombocytopenia ([place]) 03/13/2019  . Lower urinary tract infection   . Pressure injury of skin 03/12/2019  . Closed fracture of left hip ([place])   . Goals of care, counseling/discussion   . Advanced care planning/counseling discussion   . Palliative care by specialist   .  Sepsis ([place]) 03/11/2019  . Acute lower UTI 03/11/2019  . ARF (acute renal failure) ([place]) 03/11/2019  . HCAP (healthcare-associated pneumonia) 03/11/2019  . Acute metabolic encephalopathy 58/85/0277  . Confusion 02/26/2019  . Hematuria 02/26/2019  . Dementia ([place])   . HOH (hard of hearing)   . Mitral valve disease   . BPH (benign prostatic hyperplasia)   . Bradycardia from beta blocker   . Hallucinations   . Secondary hypercoagulable state ([place])   . Encounter for therapeutic drug monitoring 08/27/2013  . Long term (current) use of anticoagulants 11/30/2010  . CORONARY ATHEROSCLEROSIS NATIVE CORONARY ARTERY 01/01/2010  . MITRAL VALVE DISORDER 01/01/2010  . Aortic valve disorder 01/01/2010  . Essential hypertension, benign 09/11/2009  . Chronic atrial fibrillation 09/11/2009    Palliative Care Assessment & Plan   Patient Profile: 85 y.o. male  with past medical history of dementia with behavior disturbances, CAD, a fib, AVR and mitral valve repair (on anticoag), has visual and auditory hallucinations, recent admission for staph infection of skin/altered mental status possible related to UTI admitted on 03/11/2019 with pneumonia, urosepsis and hip fracture. Palliative medicine consulted for [place].   Assessment/Recommendations/Plan   Continue current level of care  DNR  Start scheduled Tylenol for pain control 628m q8hr po  PMT will continue to follow- if patient fails to improve or worsens- will revisit [place]with patient's daughter- [place] facility, likely avoid surgery, however, she would like to discuss this with physician and surgeon  Prognosis:   Unable to determine  [place]  Care plan was discussed with patient's daughter, EDarden [place]   Thank you for allowing the Palliative Medicine Team to assist in the care of this patient.   Time In: 1000 Time Out:  1100  Total Time 60 minutes Prolonged Time Billed Yes      Greater than 50%  of this time was spent counseling and coordinating care related to the above assessment and plan.  Mariana Kaufman, AGNP-C Palliative Medicine   Please contact Palliative Medicine Team phone at [phone] for questions and concerns.

## 2019-03-13 NOTE — Telephone Encounter (Signed)
Patient daughter Benjamine Mola is requesting a call from Dr. Holly Bodily to discuss patient being in the hospital. She has some questions. Please call at [phone]

## 2019-03-13 NOTE — Progress Notes (Signed)
Patient ID: Phillip Nolan, male   DOB: 16-May-1928, 85 y.o.   MRN: [id number]  Hip fracture ( left )  85 yo male with  Past Medical History:  Diagnosis Date  . Aortic valve disease    Status post AVR 2011  . Atrial fibrillation ([place])   . BPH (benign prostatic hyperplasia)   . CHF (congestive heart failure) ([place])   . Coronary atherosclerosis of native coronary artery    Nonobstructive  . Dementia ([place])   . Essential hypertension   . History of bacteremia    Streptococcus gordonii bacteremia 2/11 - no clear vegetation by TEE  . HOH (hard of hearing)   . Mitral valve disease    Status post annular repair 2011    Fell and was found non ambulatory by home health, brought in ems and found to be septic, with pneumonia and frx let hip   His dughter is making the decisions and asked that I weigh in   xrays left intertrochanteric hip fracture   So   I called Mrs Gillian Scarce:  We discussed risk of surgery and my recommendations are   1. I think he is high risk (cardiology says hes IM risk for cardiac event. But he has just been treated for sepsis, pneumonia and has CAD, s/p 2 valves, dementia and was deteriorating healthwise before the fracture which is now at least 71 days old)   2. His health is too poor to do the surgery here   3. His best chance despite these risks is to have the fracture fixed   4. Risks of non operative treatment "fracture disease" greater than operative treatment

## 2019-03-13 NOTE — Progress Notes (Addendum)
Pharmacy Antibiotic Note  Today is day #3 of cefepime therapy for this 85 yo male with sepsis.  Blood cultures have shown no growth to date. WBC count is wnl and patient is afebrile.  Renal function has improved, so  cefepime dose can be increased.    Plan: Increase cefepime to 2g IV q8h Monitor labs, cultures and progress.   Antiobiotics cefepime  8/24   >>    vancomycin  8/24 >> 8/25   metronidazole 8/24  x 1   Microbiology results: 8/24 [facility] x2:  NG x24h 8/24 UCx: sent   8/11 SARS Co-V-2: neg   Temp (24hrs), Avg:98.4 F (36.9 C), Min:98 F (36.7 C), Max:98.6 F (37 C)  Recent Labs  Lab 03/11/19 1752 03/11/19 2019 03/12/19 0247 03/12/19 0739 03/13/19 0501  WBC 9.4  --  7.5  --  7.0  CREATININE 1.52*  --  1.11  --  0.72  LATICACIDVEN 3.3* 2.1*  --  1.1  --     Estimated Creatinine Clearance: 72.8 mL/min (by C-G formula based on SCr of 0.72 mg/dL).    Allergies  Allergen Reactions  . Ciprofloxacin     Daughter reports caused more confusion, and INR to be abnormal  . Penicillins Rash    .Did it involve swelling of the face/tongue/throat, SOB, or low BP? {No Did it involve sudden or severe rash/hives, skin peeling, or any reaction on the inside of your mouth or nose? Yes Did you need to seek medical attention at a hospital or doctor's office? Unknown When did it last happen?Childhood If all above answers are "NO", may proceed with cephalosporin use.        Thank you for allowing pharmacy to be a part of this patient's care.  Despina Pole 03/13/2019 9:53 AM

## 2019-03-13 NOTE — Progress Notes (Signed)
PROGRESS NOTE  Oval Cavazos ZOX:[id number] DOB: [birth year] DOA: 03/11/2019 PCP: Wandra Feinstein, MD  Brief History:  85 year old male with a history of dementia, bioprosthetic AVR 2011, nonobstructive CAD, hypertension, permanent atrial fibrillation presenting with a mechanical fall suffered on 03/09/2019.  Apparently, the patient was unable to ambulate for a few days.  He was evaluated by home health physical therapy on 03/11/2019 whom immediately recognized that the patient likely had a hip fracture, and the patient was sent to the emergency department.  In the emergency department, the patient was noted to be febrile and hypotensive.  He was noted to have sepsis secondary to pneumonia.  He was started initially on vancomycin and cefepime and IV fluids.  In addition, the patient was found to have a displaced left femoral intertrochanteric fracture.  Cardiology was consulted and felt the patient was an intermediate risk for perioperative complications secondary to his cardiac issues.  However, no further cardiac testing was recommended.  Assessment/Plan: Sepsis -Present at the time of admission -Secondary to pneumonia and likely UTI -Lactic acid peaked 3.3 -Continue cefepime -Sepsis physiology resolved--patient is presently afebrile hemodynamically stable with normalized lactic acid  Acute respiratory failure with hypoxia -Secondary to pneumonia -Initially required supplemental oxygen -Patient has been weaned off to room air  Lobar pneumonia/healthcare associated pneumonia -Continue cefepime -Stable on room air presently but initially required supplemental oxygen  Pyuria -Concerning for UTI -Continue cefepime pending culture data  Coagulopathy/supratherapeutic INR -Elevated INR at the time of admission -Suspect that there is some compliance issues at home as well as dietary issues which may be resulting in the patient's INR fluctuations -Continue to follow daily  INR -Monitor for signs of bleeding -Hemoglobin remained stable  Permanent atrial fibrillation -Currently in sinus rhythm -Continue anticoagulation/Coumadin -Rate controlled -Atenolol was discontinued last admit secondary to patient's bradycardia  Valvular Heart Disease -Bioprosthetic aortic valve and mitral valve repair appear to be durable and stable as per most recent echocardiogram performed on 02/27/2019.  Thrombocytopenia -Likely related to the patient's warfarin and sepsis -TSH 2.140 -Serum B12 and folic acid normal on 02/27/19  Essential hypertension -initially hypotensive -Holding lisinopril -BP remains acceptable  Dementia with behavioral disturbance -at risk for hospital delirium -palliative medicine consulted  Left heel pressure injury -unstageable -present on admission -prevalon boots    Disposition Plan:  SNF when stable Family Communication:   Family at bedside  Consultants:  Cardiology, palliative, ortho  Code Status:  FULL   DVT Prophylaxis:  SCDs   Procedures: As Listed in Progress Note Above  Antibiotics: Cefepime 8/24>>>       Subjective: Patient denies fevers, chills, headache, chest pain, dyspnea, nausea, vomiting, diarrhea, abdominal pain, dysuria, hematuria, hematochezia, and melena.   Objective: Vitals:   03/13/19 0600 03/13/19 0758 03/13/19 0800 03/13/19 0830  BP: 114/74  121/76 (!) 145/79  Pulse: 75  (!) 54 89  Resp: (!) 23  19 20   Temp:      TempSrc:  Oral    SpO2: 95%  96% 100%  Weight:      Height:        Intake/Output Summary (Last 24 hours) at 03/13/2019 0852 Last data filed at 03/13/2019 0800 Gross per 24 hour  Intake 2595.93 ml  Output 750 ml  Net 1845.93 ml   Weight change: 5.521 kg Exam:   General:  Pt is alert, follows commands appropriately, not in acute distress  HEENT: No icterus, No thrush, No  neck mass, [place]/AT  Cardiovascular: RRR, S1/S2, no rubs, no gallops  Respiratory: bibasilar rales,  no wheeze  Abdomen: Soft/+BS, non tender, non distended, no guarding  Extremities: No edema, No lymphangitis, No petechiae, No rashes, no synovitis   Data Reviewed: I have personally reviewed following labs and imaging studies Basic Metabolic Panel: Recent Labs  Lab 03/11/19 1752 03/12/19 0247 03/13/19 0501  NA 137 140 143  K 4.1 4.0 4.1  CL 105 116* 116*  CO2 22 21* 21*  GLUCOSE 172* 139* 104*  BUN 74* 69* 45*  CREATININE 1.52* 1.11 0.72  CALCIUM 9.0 8.1* 8.5*  MG  --   --  2.4   Liver Function Tests: Recent Labs  Lab 03/11/19 1752 03/12/19 0247 03/13/19 0501  AST 38 27 21  ALT 24 21 21   ALKPHOS 60 45 48  BILITOT 3.0* 1.8* 1.9*  PROT 6.8 5.2* 5.5*  ALBUMIN 3.2* 2.4* 2.5*   No results for input(s): LIPASE, AMYLASE in the last 168 hours. No results for input(s): AMMONIA in the last 168 hours. Coagulation Profile: Recent Labs  Lab 03/08/19 03/11/19 1752 03/12/19 0739 03/13/19 0501  INR 1.5* 6.1* 7.7* 4.0*   CBC: Recent Labs  Lab 03/11/19 1752 03/12/19 0247 03/13/19 0501  WBC 9.4 7.5 7.0  NEUTROABS 8.0*  --   --   HGB 15.2 11.8* 12.0*  HCT 46.7 36.5* 37.3*  MCV 93.8 93.6 94.2  PLT 143* 101* 109*   Cardiac Enzymes: No results for input(s): CKTOTAL, CKMB, CKMBINDEX, TROPONINI in the last 168 hours. BNP: Invalid input(s): POCBNP CBG: No results for input(s): GLUCAP in the last 168 hours. HbA1C: No results for input(s): HGBA1C in the last 72 hours. Urine analysis:    Component Value Date/Time   COLORURINE AMBER (A) 03/11/2019 1733   APPEARANCEUR HAZY (A) 03/11/2019 1733   LABSPEC 1.017 03/11/2019 1733   PHURINE 5.0 03/11/2019 1733   GLUCOSEU NEGATIVE 03/11/2019 1733   HGBUR MODERATE (A) 03/11/2019 1733   BILIRUBINUR NEGATIVE 03/11/2019 1733   KETONESUR NEGATIVE 03/11/2019 1733   PROTEINUR 30 (A) 03/11/2019 1733   UROBILINOGEN 0.2 10/23/2009 1700   NITRITE NEGATIVE 03/11/2019 1733   LEUKOCYTESUR MODERATE (A) 03/11/2019 1733   Sepsis  Labs: @LABRCNTIP (procalcitonin:4,lacticidven:4) ) Recent Results (from the past 240 hour(s))  Urine culture     Status: None   Collection Time: 03/11/19  5:33 PM   Specimen: In/Out Cath Urine  Result Value Ref Range Status   Specimen Description   Final    IN/OUT CATH URINE Performed at [facility], [address] [place], [place] [address]    Special Requests   Final    NONE Performed at [facility], [address] [place], [place] [address]    Culture   Final    NO GROWTH Performed at [facility], [address] [place], [place] [address]    Report Status 03/13/2019 FINAL  Final  SARS Coronavirus 2 [facility](Hospital order, Performed in [facility] hospital lab) Nasopharyngeal Nasopharyngeal Swab     Status: None   Collection Time: 03/11/19  5:36 PM   Specimen: Nasopharyngeal Swab  Result Value Ref Range Status   SARS Coronavirus 2 NEGATIVE NEGATIVE Final    Comment: (NOTE) If result is NEGATIVE SARS-CoV-2 target nucleic acids are NOT DETECTED. The SARS-CoV-2 RNA is generally detectable in upper and lower  respiratory specimens during the acute phase of infection. The lowest  concentration of SARS-CoV-2 viral copies this assay can detect is 250  copies /  mL. A negative result does not preclude SARS-CoV-2 infection  and should not be used as the sole basis for treatment or other  patient management decisions.  A negative result may occur with  improper specimen collection / handling, submission of specimen other  than nasopharyngeal swab, presence of viral mutation(s) within the  areas targeted by this assay, and inadequate number of viral copies  (<250 copies / mL). A negative result must be combined with clinical  observations, patient history, and epidemiological information. If result is POSITIVE SARS-CoV-2 target nucleic acids are DETECTED. The SARS-CoV-2 RNA is generally detectable in upper and lower  respiratory specimens dur ing the acute phase of infection.   Positive  results are indicative of active infection with SARS-CoV-2.  Clinical  correlation with patient history and other diagnostic information is  necessary to determine patient infection status.  Positive results do  not rule out bacterial infection or co-infection with other viruses. If result is PRESUMPTIVE POSTIVE SARS-CoV-2 nucleic acids MAY BE PRESENT.   A presumptive positive result was obtained on the submitted specimen  and confirmed on repeat testing.  While 2019 novel coronavirus  (SARS-CoV-2) nucleic acids may be present in the submitted sample  additional confirmatory testing may be necessary for epidemiological  and / or clinical management purposes  to differentiate between  SARS-CoV-2 and other Sarbecovirus currently known to infect humans.  If clinically indicated additional testing with an alternate test  methodology [phone]) is advised. The SARS-CoV-2 RNA is generally  detectable in upper and lower respiratory sp ecimens during the acute  phase of infection. The expected result is Negative. Fact Sheet for Patients:  BoilerBrush.com.cy Fact Sheet for Healthcare Providers: https://pope.com/ This test is not yet approved or cleared by the [place] FDA and has been authorized for detection and/or diagnosis of SARS-CoV-2 by FDA under an Emergency Use Authorization (EUA).  This EUA will remain in effect (meaning this test can be used) for the duration of the COVID-19 declaration under Section 564(b)(1) of the Act, 21 U.S.C. section 360bbb-3(b)(1), unless the authorization is terminated or revoked sooner. Performed at [facility], [address] [place], [place] [address]   Blood Culture (routine x 2)     Status: None (Preliminary result)   Collection Time: 03/11/19  5:52 PM   Specimen: Right Antecubital; Blood  Result Value Ref Range Status   Specimen Description RIGHT ANTECUBITAL  Final   Special Requests    Final    BOTTLES DRAWN AEROBIC AND ANAEROBIC Blood Culture results may not be optimal due to an excessive volume of blood received in culture bottles   Culture   Final    NO GROWTH 2 DAYS Performed at [facility], [address] [place], [place] [address]    Report Status PENDING  Incomplete  Blood Culture (routine x 2)     Status: None (Preliminary result)   Collection Time: 03/11/19  6:00 PM   Specimen: BLOOD LEFT WRIST  Result Value Ref Range Status   Specimen Description BLOOD LEFT WRIST  Final   Special Requests   Final    BOTTLES DRAWN AEROBIC AND ANAEROBIC Blood Culture adequate volume   Culture   Final    NO GROWTH 2 DAYS Performed at [facility], [address], [place], [place] [address]    Report Status PENDING  Incomplete  MRSA PCR Screening     Status: None   Collection Time: 03/11/19 11:21 PM   Specimen: Nasal Mucosa; Nasopharyngeal  Result Value Ref  Range Status   MRSA by PCR NEGATIVE NEGATIVE Final    Comment:        The GeneXpert MRSA Assay (FDA approved for NASAL specimens only), is one component of a comprehensive MRSA colonization surveillance program. It is not intended to diagnose MRSA infection nor to guide or monitor treatment for MRSA infections. Performed at [facility], [address] [place], [place] [address]      Scheduled Meds:  Chlorhexidine Gluconate Cloth  6 each Topical Daily   Continuous Infusions:  sodium chloride 70 mL/hr at 03/13/19 0300   ceFEPime (MAXIPIME) IV Stopped (03/12/19 2213)    Procedures/Studies: Dg Chest 2 View  Result Date: 02/26/2019 CLINICAL DATA:  Altered mental status EXAM: CHEST - 2 VIEW COMPARISON:  [facility] [address] FINDINGS: Cardiomediastinal silhouette remains mildly enlarged. Median sternotomy wires and prior cardiac valve replacement. Calcific aortic knob. Mild pulmonary vascular congestion. No focal airspace consolidation. No pleural effusion or pneumothorax. Diffuse osseous demineralization. Remote  right-sided rib fractures. IMPRESSION: Cardiomegaly and mild pulmonary vascular congestion which may reflect mild CHF/fluid overload. Electronically Signed   By: Duanne GuessNicholas  Plundo M.D.   On: 02/26/2019 13:15   Ct Head Wo Contrast  Result Date: 03/11/2019 CLINICAL DATA:  Unwitnessed fall EXAM: CT HEAD WITHOUT CONTRAST CT CERVICAL SPINE WITHOUT CONTRAST TECHNIQUE: Multidetector CT imaging of the head and cervical spine was performed following the standard protocol without intravenous contrast. Multiplanar CT image reconstructions of the cervical spine were also generated. COMPARISON:  06/07/2019 FINDINGS: CT HEAD FINDINGS Brain: There is atrophy and chronic small vessel disease changes. No acute intracranial abnormality. Specifically, no hemorrhage, hydrocephalus, mass lesion, acute infarction, or significant intracranial injury. Vascular: No hyperdense vessel or unexpected calcification. Skull: No acute calvarial abnormality. Sinuses/Orbits: Visualized paranasal sinuses and mastoids clear. Orbital soft tissues unremarkable. Other: No subluxation. CT CERVICAL SPINE FINDINGS Alignment: Normal. Skull base and vertebrae: No acute fracture. No primary bone lesion or focal pathologic process. Soft tissues and spinal canal: No prevertebral fluid or swelling. No visible canal hematoma. Disc levels:  Diffuse degenerative disc and facet disease. Upper chest: Airspace opacity partially imaged in the right upper lobe. Other: None IMPRESSION: Atrophy, chronic microvascular disease. No acute intracranial abnormality. No acute bony abnormality in the cervical spine. Cervical spondylosis. Airspace disease in the right upper lobe concerning for pneumonia as seen on chest x-ray. Electronically Signed   By: Charlett NoseKevin  Dover M.D.   On: 03/11/2019 19:48   Ct Head Wo Contrast  Result Date: 02/26/2019 CLINICAL DATA:  Confusion for the past 2 weeks. EXAM: CT HEAD WITHOUT CONTRAST TECHNIQUE: Contiguous axial images were obtained from the  base of the skull through the vertex without intravenous contrast. COMPARISON:  None. FINDINGS: Brain: No evidence of acute infarction, hemorrhage, hydrocephalus, extra-axial collection or mass lesion/mass effect. Mild generalized cerebral atrophy. Vascular: Atherosclerotic vascular calcification of the carotid siphons. No hyperdense vessel. Skull: Normal. Negative for fracture or focal lesion. Sinuses/Orbits: Partial opacification of the right mastoid air cells. Otherwise unremarkable. Other: None. IMPRESSION: 1.  No acute intracranial abnormality. Electronically Signed   By: Obie DredgeWilliam T Derry M.D.   On: 02/26/2019 13:22   Ct Cervical Spine Wo Contrast  Result Date: 03/11/2019 CLINICAL DATA:  Unwitnessed fall EXAM: CT HEAD WITHOUT CONTRAST CT CERVICAL SPINE WITHOUT CONTRAST TECHNIQUE: Multidetector CT imaging of the head and cervical spine was performed following the standard protocol without intravenous contrast. Multiplanar CT image reconstructions of the cervical spine were also generated. COMPARISON:  06/07/2019 FINDINGS: CT HEAD FINDINGS Brain: There is  atrophy and chronic small vessel disease changes. No acute intracranial abnormality. Specifically, no hemorrhage, hydrocephalus, mass lesion, acute infarction, or significant intracranial injury. Vascular: No hyperdense vessel or unexpected calcification. Skull: No acute calvarial abnormality. Sinuses/Orbits: Visualized paranasal sinuses and mastoids clear. Orbital soft tissues unremarkable. Other: No subluxation. CT CERVICAL SPINE FINDINGS Alignment: Normal. Skull base and vertebrae: No acute fracture. No primary bone lesion or focal pathologic process. Soft tissues and spinal canal: No prevertebral fluid or swelling. No visible canal hematoma. Disc levels:  Diffuse degenerative disc and facet disease. Upper chest: Airspace opacity partially imaged in the right upper lobe. Other: None IMPRESSION: Atrophy, chronic microvascular disease. No acute intracranial  abnormality. No acute bony abnormality in the cervical spine. Cervical spondylosis. Airspace disease in the right upper lobe concerning for pneumonia as seen on chest x-ray. Electronically Signed   By: Charlett Nose M.D.   On: 03/11/2019 19:48   Mr Brain Wo Contrast  Result Date: 02/27/2019 CLINICAL DATA:  Altered level of consciousness. EXAM: MRI HEAD WITHOUT CONTRAST TECHNIQUE: Multiplanar, multiecho pulse sequences of the brain and surrounding structures were obtained without intravenous contrast. COMPARISON:  CT head 02/26/2019 FINDINGS: Brain: Mild atrophy.  Negative for hydrocephalus. Negative for acute infarct. Small white matter hyperintensities, minimal in degree. Chronic microhemorrhage in the right parietal lobe. Negative for mass or fluid collection. No midline shift. Vascular: Normal arterial flow voids. Skull and upper cervical spine: Negative Sinuses/Orbits: Paranasal sinuses clear. Right mastoid effusion. Left mastoid clear. Negative orbit. Other: None IMPRESSION: Negative for acute infarct Generalized atrophy without hydrocephalus. Minimal chronic white matter changes. Chronic microhemorrhage in the right parietal lobe. Electronically Signed   By: Marlan Palau M.D.   On: 02/27/2019 15:55   Dg Pelvis Portable  Result Date: 03/11/2019 CLINICAL DATA:  85 year old presenting after a fall. LEFT hip pain. Initial encounter. EXAM: PORTABLE PELVIS 1-2 VIEWS COMPARISON:  None. FINDINGS: Acute comminuted intertrochanteric LEFT femoral neck fracture. LEFT hip joint anatomically aligned with mild axial joint space narrowing. Osseous demineralization. Symmetric mild axial joint space narrowing in the contralateral RIGHT hip. Bone island in the LOWER RIGHT acetabulum. Sacroiliac joints and symphysis pubis anatomically aligned without significant degenerative changes. Degenerative changes involving the visualized LOWER lumbar spine. IMPRESSION: Acute comminuted intertrochanteric LEFT femoral neck  fracture. Electronically Signed   By: Hulan Saas M.D.   On: 03/11/2019 20:13   Dg Chest Port 1 View  Result Date: 03/13/2019 CLINICAL DATA:  Cough, hypertension EXAM: PORTABLE CHEST 1 VIEW COMPARISON:  03/11/2019 FINDINGS: Moderate cardiomegaly. Prior median sternotomy. Persistent elevation of the right hemidiaphragm. Diffuse right lung airspace opacities. Left lung remains clear. No pneumothorax. IMPRESSION: Persistent airspace opacities throughout the right lung. Electronically Signed   By: Duanne Guess M.D.   On: 03/13/2019 08:37   Dg Chest Portable 1 View  Result Date: 03/11/2019 CLINICAL DATA:  Fall EXAM: PORTABLE CHEST 1 VIEW COMPARISON:  02/26/2019 FINDINGS: Moderate cardiomegaly, unchanged. Remote median sternotomy. There are shallow inflation of the right lung with hazy airspace opacities. The left lung is clear. IMPRESSION: Shallow inflation of the right lung with hazy airspace opacities Electronically Signed   By: Deatra Robinson M.D.   On: 03/11/2019 20:30   Dg Chest Port 1 View  Result Date: 02/17/2019 CLINICAL DATA:  Per daughter patient has hx of dementia but has significant altered mental status in past 2 weeks. Patient is having auditory and visual hallucinations. Patient wandering away from home. Patient tried to remove his clothes yesterday. No aggression. EXAM: PORTABLE CHEST  1 VIEW COMPARISON:  12/21/2009 FINDINGS: Stable changes from prior cardiac surgery. Cardiac silhouette mildly enlarged. No mediastinal or hilar masses. Mild opacity at the right lung base is similar to the prior study. There is vascular prominence, but no convincing pulmonary edema. No evidence of pneumonia. No pleural effusion or pneumothorax. Skeletal structures are grossly intact. IMPRESSION: No acute cardiopulmonary disease. Electronically Signed   By: Lajean Manes M.D.   On: 02/17/2019 16:14   Dg Foot Complete Left  Result Date: 03/11/2019 CLINICAL DATA:  Fall EXAM: LEFT FOOT-SINGLE VIEW  COMPARISON:  None. FINDINGS: A single lateral view of the foot was obtained. This shows no acute fracture or dislocation. There are dorsal and posterior vascular calcifications. There is skin thickening of the left heel. No soft tissue gas or focal ulceration. IMPRESSION: Skin thickening of the left heel.  No soft tissue gas or ulceration. Electronically Signed   By: Ulyses Jarred M.D.   On: 03/11/2019 20:24   Dg Hip Unilat W Or Wo Pelvis 2-3 Views Left  Result Date: 03/11/2019 CLINICAL DATA:  Fall EXAM: DG HIP (WITH OR WITHOUT PELVIS) 2-3V LEFT COMPARISON:  None. FINDINGS: Minimally displaced intertrochanteric fracture of the proximal left femur. No femoroacetabular dislocation. Limited visualization of the remainder of the pelvis. IMPRESSION: Minimally displaced intertrochanteric fracture of the left femur. Electronically Signed   By: Ulyses Jarred M.D.   On: 03/11/2019 20:22    Orson Eva, DO  [facility] Pager [phone]  If 7PM-7AM, please contact night-coverage [email or web address] Password [email or web address] 03/13/2019, 8:52 AM   LOS: 2 days

## 2019-03-13 NOTE — Telephone Encounter (Signed)
Mr. Kreuser daughter, Darden Palmer called and said she needs to speak to Dr. Aline Brochure as soon as possible.  She states she is trying to make some decisions regarding her father's care and has some questions.  Please call Darden Palmer at [phone]  Thanks

## 2019-03-13 NOTE — Telephone Encounter (Signed)
Called Mrs Phillip Nolan:  We discussed risk of surgery and my recommendations  1. I think he is high risk (cardiology says hes IM risk for cardiac event. But he has has sepsis, pneumonia and has CAD, s/p 2 valves, dementia and was deteriorating healthwise before the fracture which is now at least 1 days old)   2. His health is too poor t do the surgery here   3. His best chance despite these risks is to have the fracture fixed   4. Risks of non operative treatment "fracture disease" greater than operative treatment

## 2019-03-14 DIAGNOSIS — G3109 Other frontotemporal dementia: Secondary | ICD-10-CM

## 2019-03-14 DIAGNOSIS — F0281 Dementia in other diseases classified elsewhere with behavioral disturbance: Secondary | ICD-10-CM

## 2019-03-14 LAB — CBC
HCT: 37.8 % — ABNORMAL LOW (ref 39.0–52.0)
Hemoglobin: 12 g/dL — ABNORMAL LOW (ref 13.0–17.0)
MCH: 30.5 pg (ref 26.0–34.0)
MCHC: 31.7 g/dL (ref 30.0–36.0)
MCV: 95.9 fL (ref 80.0–100.0)
Platelets: 138 10*3/uL — ABNORMAL LOW (ref 150–400)
RBC: 3.94 MIL/uL — ABNORMAL LOW (ref 4.22–5.81)
RDW: 13.2 % (ref 11.5–15.5)
WBC: 7.5 10*3/uL (ref 4.0–10.5)
nRBC: 0 % (ref 0.0–0.2)

## 2019-03-14 LAB — COMPREHENSIVE METABOLIC PANEL
ALT: 26 U/L (ref 0–44)
AST: 24 U/L (ref 15–41)
Albumin: 2.6 g/dL — ABNORMAL LOW (ref 3.5–5.0)
Alkaline Phosphatase: 73 U/L (ref 38–126)
Anion gap: 5 (ref 5–15)
BUN: 33 mg/dL — ABNORMAL HIGH (ref 8–23)
CO2: 23 mmol/L (ref 22–32)
Calcium: 8.6 mg/dL — ABNORMAL LOW (ref 8.9–10.3)
Chloride: 113 mmol/L — ABNORMAL HIGH (ref 98–111)
Creatinine, Ser: 0.7 mg/dL (ref 0.61–1.24)
GFR calc Af Amer: >60 mL/min (ref >60)
GFR calc non Af Amer: >60 mL/min (ref >60)
Glucose, Bld: 127 mg/dL — ABNORMAL HIGH (ref 70–99)
Potassium: 4.1 mmol/L (ref 3.5–5.1)
Sodium: 141 mmol/L (ref 135–145)
Total Bilirubin: 1.9 mg/dL — ABNORMAL HIGH (ref 0.3–1.2)
Total Protein: 5.8 g/dL — ABNORMAL LOW (ref 6.5–8.1)

## 2019-03-14 LAB — PROTIME-INR
INR: 1.8 — ABNORMAL HIGH (ref 0.8–1.2)
Prothrombin Time: 20.8 seconds — ABNORMAL HIGH (ref 11.4–15.2)

## 2019-03-14 LAB — MAGNESIUM: Magnesium: 2.4 mg/dL (ref 1.7–2.4)

## 2019-03-14 MED ORDER — ENSURE PRE-SURGERY PO LIQD
296.0000 mL | Freq: Once | ORAL | Status: AC
  Administered 2019-03-15: 02:00:00 296 mL via ORAL
  Filled 2019-03-14: qty 296

## 2019-03-14 MED ORDER — OLANZAPINE 5 MG PO TBDP
2.5000 mg | ORAL_TABLET | Freq: Every day | ORAL | Status: DC
  Administered 2019-03-15 – 2019-03-17 (×4): 2.5 mg via ORAL
  Filled 2019-03-14 (×5): qty 0.5

## 2019-03-14 MED ORDER — POVIDONE-IODINE 10 % EX SWAB: 2.0000 "application " | Freq: Once | CUTANEOUS | Status: DC

## 2019-03-14 MED ORDER — CEFAZOLIN SODIUM-DEXTROSE 2-4 GM/100ML-% IV SOLN: 2.0000 g | INTRAVENOUS | Status: DC

## 2019-03-14 MED ORDER — WARFARIN - PHARMACIST DOSING INPATIENT
Freq: Every day | Status: DC
  Administered 2019-03-14: 17:00:00

## 2019-03-14 MED ORDER — WARFARIN SODIUM 7.5 MG PO TABS: 7.5000 mg | ORAL_TABLET | Freq: Once | ORAL | Status: DC

## 2019-03-14 MED ORDER — CHLORHEXIDINE GLUCONATE 4 % EX LIQD
60.0000 mL | Freq: Once | CUTANEOUS | Status: AC
  Administered 2019-03-15: 4 via TOPICAL
  Filled 2019-03-14: qty 60

## 2019-03-14 MED ORDER — HEPARIN (PORCINE) 25000 UT/250ML-% IV SOLN
1350.0000 [IU]/h | INTRAVENOUS | Status: DC
  Administered 2019-03-14: 14:00:00 1200 [IU]/h via INTRAVENOUS
  Filled 2019-03-14: qty 250

## 2019-03-14 NOTE — Progress Notes (Addendum)
PROGRESS NOTE  Jonelle SportsRufus Wyley Culton ZOX:[id number] DOB: 25-Feb-1928 DOA: 03/11/2019 PCP: Wandra Feinsteinorum, Lisa L, MD  Brief History:  85 year old male with a history of dementia, bioprosthetic AVR 2011, nonobstructive CAD, hypertension, permanent atrial fibrillation presenting with a mechanical fall suffered on 03/09/2019.  Apparently, the patient was unable to ambulate for a few days.  He was evaluated by home health physical therapy on 03/11/2019 whom immediately recognized that the patient likely had a hip fracture, and the patient was sent to the emergency department.  In the emergency department, the patient was noted to be febrile and hypotensive.  He was noted to have sepsis secondary to pneumonia.  He was started initially on vancomycin and cefepime and IV fluids.  In addition, the patient was found to have a displaced left femoral intertrochanteric fracture.  Cardiology was consulted and felt the patient was an intermediate risk for perioperative complications secondary to his cardiac issues.  However, no further cardiac testing was recommended.  He improved clinically and sepsis physiology resolved.  Ortho, Dr. Romeo AppleHarrison, felt pt was [facility].  Therefore, the patient was tranferred [facility].  Assessment/Plan: Sepsis -Present at the time of admission -Secondary to pneumonia and likely UTI -Lactic acid peaked 3.3>>>1.1 -03/11/19 blood cultures neg x 2 -Continue cefepime -Sepsis physiology resolved--patient is presently afebrile hemodynamically stable with normalized lactic acid  Acute respiratory failure with hypoxia -Secondary to pneumonia -Initially required supplemental oxygen -Patient stable on 2L  Lobar pneumonia/healthcare associated pneumonia -Continue cefepime -Stable on 2L  Left Intertrochanteric hip fracture -case discussed with ortho, Dr. Phylis BougieHarrison-->felt pt would need to be transferred [facility] due to pt's higher surgical risk -He spoke  with daughter -03/14/19--spoke with ortho--Dr. Duwayne HeckJason Rogers who will see pt in consult after tranfer  Pyuria -Concerning for UTI -urine culture no growth  Coagulopathy/supratherapeutic INR -Elevated INR at the time of admission -Suspect that there is some compliance issues at home as well as dietary issues which may be resulting in the patient's INR fluctuations -Continue to follow daily INR -Monitor for signs of bleeding -Hemoglobin remained stable -INR trended down to 1.8 by holding coumadin-->start IV heparin in preparation for surgery  Permanent atrial fibrillation -Currently in sinus rhythm -Continue anticoagulation/Coumadin-->IV heparin in preparation for surgery -pt was seen by cardiology-->intermediate risk for ortho surgery, no further cardiac testing needed at this time -Rate controlled -Atenolol was discontinued last admit secondary to patient's bradycardia  Valvular Heart Disease -Bioprosthetic aortic valve and mitral valve repair appear to be durable and stable as per most recent echocardiogram performed on 02/27/2019.  Thrombocytopenia -Likely related to the patient's warfarin and sepsis -TSH 2.140 -Serum B12 and folic acid normal on 02/27/19  Essential hypertension -initially hypotensive -Holding lisinopril -BP remains acceptable/stable  Dementia with behavioral disturbance -at risk for hospital delirium -palliative medicine consulted-->DNR  Left heel pressure injury -unstageable -present on admission -prevalon boots    Disposition Plan:  [facility] Family Communication:   Family at bedside  Consultants:  Cardiology, palliative, ortho  Code Status:  FULL   DVT Prophylaxis:  SCDs   Procedures: As Listed in Progress Note Above  Antibiotics: Cefepime 8/24>>>  Total time spent 55 minutes.  Greater than 50% spent face to face counseling and coordinating care.     Subjective: Pt is pleasantly confused with  intermittent episodes of agitation.  Patient denies fevers, chills, headache, chest pain, dyspnea, nausea, vomiting, diarrhea, abdominal pain,  dysuria, hematuria, hematochezia, and melena. He complains of left hip pain.  Objective: Vitals:   03/14/19 0810 03/14/19 0815 03/14/19 0830 03/14/19 0900  BP:    95/64  Pulse:   78 63  Resp: 19 18    Temp:      TempSrc:      SpO2:    94%  Weight:      Height:        Intake/Output Summary (Last 24 hours) at 03/14/2019 1012 Last data filed at 03/13/2019 1800 Gross per 24 hour  Intake 200 ml  Output 200 ml  Net 0 ml   Weight change: 0 kg Exam:   General:  Pt is alert, follows commands appropriately, not in acute distress  HEENT: No icterus, No thrush, No neck mass, [place]/AT  Cardiovascular: RRR, S1/S2, no rubs, no gallops  Respiratory: bibasilar rales, R>L, no wheeze  Abdomen: Soft/+BS, non tender, non distended, no guarding  Extremities: trace nonpitting edema, No lymphangitis, No petechiae, No rashes, no synovitis   Data Reviewed: I have personally reviewed following labs and imaging studies Basic Metabolic Panel: Recent Labs  Lab 03/11/19 1752 03/12/19 0247 03/13/19 0501  NA 137 140 143  K 4.1 4.0 4.1  CL 105 116* 116*  CO2 22 21* 21*  GLUCOSE 172* 139* 104*  BUN 74* 69* 45*  CREATININE 1.52* 1.11 0.72  CALCIUM 9.0 8.1* 8.5*  MG  --   --  2.4   Liver Function Tests: Recent Labs  Lab 03/11/19 1752 03/12/19 0247 03/13/19 0501  AST 38 27 21  ALT 24 21 21   ALKPHOS 60 45 48  BILITOT 3.0* 1.8* 1.9*  PROT 6.8 5.2* 5.5*  ALBUMIN 3.2* 2.4* 2.5*   No results for input(s): LIPASE, AMYLASE in the last 168 hours. No results for input(s): AMMONIA in the last 168 hours. Coagulation Profile: Recent Labs  Lab 03/08/19 03/11/19 1752 03/12/19 0739 03/13/19 0501  INR 1.5* 6.1* 7.7* 4.0*   CBC: Recent Labs  Lab 03/11/19 1752 03/12/19 0247 03/13/19 0501  WBC 9.4 7.5 7.0  NEUTROABS 8.0*  --   --   HGB 15.2 11.8*  12.0*  HCT 46.7 36.5* 37.3*  MCV 93.8 93.6 94.2  PLT 143* 101* 109*   Cardiac Enzymes: No results for input(s): CKTOTAL, CKMB, CKMBINDEX, TROPONINI in the last 168 hours. BNP: Invalid input(s): POCBNP CBG: No results for input(s): GLUCAP in the last 168 hours. HbA1C: No results for input(s): HGBA1C in the last 72 hours. Urine analysis:    Component Value Date/Time   COLORURINE AMBER (A) 03/11/2019 1733   APPEARANCEUR HAZY (A) 03/11/2019 1733   LABSPEC 1.017 03/11/2019 1733   PHURINE 5.0 03/11/2019 1733   GLUCOSEU NEGATIVE 03/11/2019 1733   HGBUR MODERATE (A) 03/11/2019 [place] 03/11/2019 1733   KETONESUR NEGATIVE 03/11/2019 1733   PROTEINUR 30 (A) 03/11/2019 1733   UROBILINOGEN 0.2 10/23/2009 1700   NITRITE NEGATIVE 03/11/2019 1733   LEUKOCYTESUR MODERATE (A) 03/11/2019 1733   Sepsis Labs: @LABRCNTIP (procalcitonin:4,lacticidven:4) ) Recent Results (from the past 240 hour(s))  Urine culture     Status: None   Collection Time: 03/11/19  5:33 PM   Specimen: In/Out Cath Urine  Result Value Ref Range Status   Specimen Description   Final    IN/OUT CATH URINE Performed at [facility], [address] [place], [place] [address]    Special Requests   Final    NONE Performed at [facility], [address], [place], [place] [address]  Culture   Final    NO GROWTH Performed at [facility], [address], [place], [place] [address]    Report Status 03/13/2019 FINAL  Final  SARS Coronavirus 2 [facility] order, Performed in [facility] hospital lab) Nasopharyngeal Nasopharyngeal Swab     Status: None   Collection Time: 03/11/19  5:36 PM   Specimen: Nasopharyngeal Swab  Result Value Ref Range Status   SARS Coronavirus 2 NEGATIVE NEGATIVE Final    Comment: (NOTE) If result is NEGATIVE SARS-CoV-2 target nucleic acids are NOT DETECTED. The SARS-CoV-2 RNA is generally detectable in upper and lower  respiratory specimens during the acute phase of  infection. The lowest  concentration of SARS-CoV-2 viral copies this assay can detect is 250  copies / mL. A negative result does not preclude SARS-CoV-2 infection  and should not be used as the sole basis for treatment or other  patient management decisions.  A negative result may occur with  improper specimen collection / handling, submission of specimen other  than nasopharyngeal swab, presence of viral mutation(s) within the  areas targeted by this assay, and inadequate number of viral copies  (<250 copies / mL). A negative result must be combined with clinical  observations, patient history, and epidemiological information. If result is POSITIVE SARS-CoV-2 target nucleic acids are DETECTED. The SARS-CoV-2 RNA is generally detectable in upper and lower  respiratory specimens dur ing the acute phase of infection.  Positive  results are indicative of active infection with SARS-CoV-2.  Clinical  correlation with patient history and other diagnostic information is  necessary to determine patient infection status.  Positive results do  not rule out bacterial infection or co-infection with other viruses. If result is PRESUMPTIVE POSTIVE SARS-CoV-2 nucleic acids MAY BE PRESENT.   A presumptive positive result was obtained on the submitted specimen  and confirmed on repeat testing.  While 2019 novel coronavirus  (SARS-CoV-2) nucleic acids may be present in the submitted sample  additional confirmatory testing may be necessary for epidemiological  and / or clinical management purposes  to differentiate between  SARS-CoV-2 and other Sarbecovirus currently known to infect humans.  If clinically indicated additional testing with an alternate test  methodology [phone]) is advised. The SARS-CoV-2 RNA is generally  detectable in upper and lower respiratory sp ecimens during the acute  phase of infection. The expected result is Negative. Fact Sheet for Patients:   BoilerBrush.com.cy Fact Sheet for Healthcare Providers: https://pope.com/ This test is not yet approved or cleared by the [place] FDA and has been authorized for detection and/or diagnosis of SARS-CoV-2 by FDA under an Emergency Use Authorization (EUA).  This EUA will remain in effect (meaning this test can be used) for the duration of the COVID-19 declaration under Section 564(b)(1) of the Act, 21 U.S.C. section 360bbb-3(b)(1), unless the authorization is terminated or revoked sooner. Performed at [facility], [address], [place], [place] [address]   Blood Culture (routine x 2)     Status: None (Preliminary result)   Collection Time: 03/11/19  5:52 PM   Specimen: Right Antecubital; Blood  Result Value Ref Range Status   Specimen Description RIGHT ANTECUBITAL  Final   Special Requests   Final    BOTTLES DRAWN AEROBIC AND ANAEROBIC Blood Culture results may not be optimal due to an excessive volume of blood received in culture bottles   Culture   Final    NO GROWTH 3 DAYS Performed at [facility] [address] [place],  [place] [address]    Report Status PENDING  Incomplete  Blood Culture (routine x 2)     Status: None (Preliminary result)   Collection Time: 03/11/19  6:00 PM   Specimen: BLOOD LEFT WRIST  Result Value Ref Range Status   Specimen Description BLOOD LEFT WRIST  Final   Special Requests   Final    BOTTLES DRAWN AEROBIC AND ANAEROBIC Blood Culture adequate volume   Culture   Final    NO GROWTH 3 DAYS Performed at [facility], [address], [place], [place] [address]    Report Status PENDING  Incomplete  MRSA PCR Screening     Status: None   Collection Time: 03/11/19 11:21 PM   Specimen: Nasal Mucosa; Nasopharyngeal  Result Value Ref Range Status   MRSA by PCR NEGATIVE NEGATIVE Final    Comment:        The GeneXpert MRSA Assay (FDA approved for NASAL specimens only), is one component of a comprehensive  MRSA colonization surveillance program. It is not intended to diagnose MRSA infection nor to guide or monitor treatment for MRSA infections. Performed at [facility], [address], [place], [place] [address]      Scheduled Meds:  acetaminophen  650 mg Oral Q8H   Chlorhexidine Gluconate Cloth  6 each Topical Daily   Continuous Infusions:  ceFEPime (MAXIPIME) IV 2 g (03/14/19 0806)    Procedures/Studies: Dg Chest 2 View  Result Date: 02/26/2019 CLINICAL DATA:  Altered mental status EXAM: CHEST - 2 VIEW COMPARISON:  [facility] [address] FINDINGS: Cardiomediastinal silhouette remains mildly enlarged. Median sternotomy wires and prior cardiac valve replacement. Calcific aortic knob. Mild pulmonary vascular congestion. No focal airspace consolidation. No pleural effusion or pneumothorax. Diffuse osseous demineralization. Remote right-sided rib fractures. IMPRESSION: Cardiomegaly and mild pulmonary vascular congestion which may reflect mild CHF/fluid overload. Electronically Signed   By: Duanne Guess M.D.   On: 02/26/2019 13:15   Ct Head Wo Contrast  Result Date: 03/11/2019 CLINICAL DATA:  Unwitnessed fall EXAM: CT HEAD WITHOUT CONTRAST CT CERVICAL SPINE WITHOUT CONTRAST TECHNIQUE: Multidetector CT imaging of the head and cervical spine was performed following the standard protocol without intravenous contrast. Multiplanar CT image reconstructions of the cervical spine were also generated. COMPARISON:  06/07/2019 FINDINGS: CT HEAD FINDINGS Brain: There is atrophy and chronic small vessel disease changes. No acute intracranial abnormality. Specifically, no hemorrhage, hydrocephalus, mass lesion, acute infarction, or significant intracranial injury. Vascular: No hyperdense vessel or unexpected calcification. Skull: No acute calvarial abnormality. Sinuses/Orbits: Visualized paranasal sinuses and mastoids clear. Orbital soft tissues unremarkable. Other: No subluxation. CT CERVICAL SPINE FINDINGS Alignment:  Normal. Skull base and vertebrae: No acute fracture. No primary bone lesion or focal pathologic process. Soft tissues and spinal canal: No prevertebral fluid or swelling. No visible canal hematoma. Disc levels:  Diffuse degenerative disc and facet disease. Upper chest: Airspace opacity partially imaged in the right upper lobe. Other: None IMPRESSION: Atrophy, chronic microvascular disease. No acute intracranial abnormality. No acute bony abnormality in the cervical spine. Cervical spondylosis. Airspace disease in the right upper lobe concerning for pneumonia as seen on chest x-ray. Electronically Signed   By: Charlett Nose M.D.   On: 03/11/2019 19:48   Ct Head Wo Contrast  Result Date: 02/26/2019 CLINICAL DATA:  Confusion for the past 2 weeks. EXAM: CT HEAD WITHOUT CONTRAST TECHNIQUE: Contiguous axial images were obtained from the base of the skull through the vertex without intravenous contrast. COMPARISON:  None. FINDINGS: Brain: No evidence of acute infarction,  hemorrhage, hydrocephalus, extra-axial collection or mass lesion/mass effect. Mild generalized cerebral atrophy. Vascular: Atherosclerotic vascular calcification of the carotid siphons. No hyperdense vessel. Skull: Normal. Negative for fracture or focal lesion. Sinuses/Orbits: Partial opacification of the right mastoid air cells. Otherwise unremarkable. Other: None. IMPRESSION: 1.  No acute intracranial abnormality. Electronically Signed   By: Obie DredgeWilliam T Derry M.D.   On: 02/26/2019 13:22   Ct Cervical Spine Wo Contrast  Result Date: 03/11/2019 CLINICAL DATA:  Unwitnessed fall EXAM: CT HEAD WITHOUT CONTRAST CT CERVICAL SPINE WITHOUT CONTRAST TECHNIQUE: Multidetector CT imaging of the head and cervical spine was performed following the standard protocol without intravenous contrast. Multiplanar CT image reconstructions of the cervical spine were also generated. COMPARISON:  06/07/2019 FINDINGS: CT HEAD FINDINGS Brain: There is atrophy and chronic small  vessel disease changes. No acute intracranial abnormality. Specifically, no hemorrhage, hydrocephalus, mass lesion, acute infarction, or significant intracranial injury. Vascular: No hyperdense vessel or unexpected calcification. Skull: No acute calvarial abnormality. Sinuses/Orbits: Visualized paranasal sinuses and mastoids clear. Orbital soft tissues unremarkable. Other: No subluxation. CT CERVICAL SPINE FINDINGS Alignment: Normal. Skull base and vertebrae: No acute fracture. No primary bone lesion or focal pathologic process. Soft tissues and spinal canal: No prevertebral fluid or swelling. No visible canal hematoma. Disc levels:  Diffuse degenerative disc and facet disease. Upper chest: Airspace opacity partially imaged in the right upper lobe. Other: None IMPRESSION: Atrophy, chronic microvascular disease. No acute intracranial abnormality. No acute bony abnormality in the cervical spine. Cervical spondylosis. Airspace disease in the right upper lobe concerning for pneumonia as seen on chest x-ray. Electronically Signed   By: Charlett NoseKevin  Dover M.D.   On: 03/11/2019 19:48   Mr Brain Wo Contrast  Result Date: 02/27/2019 CLINICAL DATA:  Altered level of consciousness. EXAM: MRI HEAD WITHOUT CONTRAST TECHNIQUE: Multiplanar, multiecho pulse sequences of the brain and surrounding structures were obtained without intravenous contrast. COMPARISON:  CT head 02/26/2019 FINDINGS: Brain: Mild atrophy.  Negative for hydrocephalus. Negative for acute infarct. Small white matter hyperintensities, minimal in degree. Chronic microhemorrhage in the right parietal lobe. Negative for mass or fluid collection. No midline shift. Vascular: Normal arterial flow voids. Skull and upper cervical spine: Negative Sinuses/Orbits: Paranasal sinuses clear. Right mastoid effusion. Left mastoid clear. Negative orbit. Other: None IMPRESSION: Negative for acute infarct Generalized atrophy without hydrocephalus. Minimal chronic white matter  changes. Chronic microhemorrhage in the right parietal lobe. Electronically Signed   By: Marlan Palauharles  Clark M.D.   On: 02/27/2019 15:55   Dg Pelvis Portable  Result Date: 03/11/2019 CLINICAL DATA:  85 year old presenting after a fall. LEFT hip pain. Initial encounter. EXAM: PORTABLE PELVIS 1-2 VIEWS COMPARISON:  None. FINDINGS: Acute comminuted intertrochanteric LEFT femoral neck fracture. LEFT hip joint anatomically aligned with mild axial joint space narrowing. Osseous demineralization. Symmetric mild axial joint space narrowing in the contralateral RIGHT hip. Bone island in the LOWER RIGHT acetabulum. Sacroiliac joints and symphysis pubis anatomically aligned without significant degenerative changes. Degenerative changes involving the visualized LOWER lumbar spine. IMPRESSION: Acute comminuted intertrochanteric LEFT femoral neck fracture. Electronically Signed   By: Hulan Saashomas  Lawrence M.D.   On: 03/11/2019 20:13   Dg Chest Port 1 View  Result Date: 03/13/2019 CLINICAL DATA:  Cough, hypertension EXAM: PORTABLE CHEST 1 VIEW COMPARISON:  03/11/2019 FINDINGS: Moderate cardiomegaly. Prior median sternotomy. Persistent elevation of the right hemidiaphragm. Diffuse right lung airspace opacities. Left lung remains clear. No pneumothorax. IMPRESSION: Persistent airspace opacities throughout the right lung. Electronically Signed   By: Vernice JeffersonNicholas  Plundo M.D.  On: 03/13/2019 08:37   Dg Chest Portable 1 View  Result Date: 03/11/2019 CLINICAL DATA:  Fall EXAM: PORTABLE CHEST 1 VIEW COMPARISON:  02/26/2019 FINDINGS: Moderate cardiomegaly, unchanged. Remote median sternotomy. There are shallow inflation of the right lung with hazy airspace opacities. The left lung is clear. IMPRESSION: Shallow inflation of the right lung with hazy airspace opacities Electronically Signed   By: Deatra Robinson M.D.   On: 03/11/2019 20:30   Dg Chest Port 1 View  Result Date: 02/17/2019 CLINICAL DATA:  Per daughter patient has hx of dementia  but has significant altered mental status in past 2 weeks. Patient is having auditory and visual hallucinations. Patient wandering away from home. Patient tried to remove his clothes yesterday. No aggression. EXAM: PORTABLE CHEST 1 VIEW COMPARISON:  12/21/2009 FINDINGS: Stable changes from prior cardiac surgery. Cardiac silhouette mildly enlarged. No mediastinal or hilar masses. Mild opacity at the right lung base is similar to the prior study. There is vascular prominence, but no convincing pulmonary edema. No evidence of pneumonia. No pleural effusion or pneumothorax. Skeletal structures are grossly intact. IMPRESSION: No acute cardiopulmonary disease. Electronically Signed   By: Amie Portland M.D.   On: 02/17/2019 16:14   Dg Foot Complete Left  Result Date: 03/11/2019 CLINICAL DATA:  Fall EXAM: LEFT FOOT-SINGLE VIEW COMPARISON:  None. FINDINGS: A single lateral view of the foot was obtained. This shows no acute fracture or dislocation. There are dorsal and posterior vascular calcifications. There is skin thickening of the left heel. No soft tissue gas or focal ulceration. IMPRESSION: Skin thickening of the left heel.  No soft tissue gas or ulceration. Electronically Signed   By: Deatra Robinson M.D.   On: 03/11/2019 20:24   Dg Hip Unilat W Or Wo Pelvis 2-3 Views Left  Result Date: 03/11/2019 CLINICAL DATA:  Fall EXAM: DG HIP (WITH OR WITHOUT PELVIS) 2-3V LEFT COMPARISON:  None. FINDINGS: Minimally displaced intertrochanteric fracture of the proximal left femur. No femoroacetabular dislocation. Limited visualization of the remainder of the pelvis. IMPRESSION: Minimally displaced intertrochanteric fracture of the left femur. Electronically Signed   By: Deatra Robinson M.D.   On: 03/11/2019 20:22    Catarina Hartshorn, DO  [facility] Pager [phone]  If 7PM-7AM, please contact night-coverage [email or web address] Password [email or web address] 03/14/2019, 10:12 AM   LOS: 3 days

## 2019-03-14 NOTE — Progress Notes (Signed)
Daily Progress Note   Patient Name: Phillip Nolan       Date: 03/14/2019 DOB: Nov 09, 1927  Age: 85 y.o. MRN#: [id number] Attending Physician: Orson Eva, MD Primary Care Physician: Maryruth Hancock, MD Admit Date: 03/11/2019  Reason for Consultation/Follow-up: Establishing goals of care  Subjective: Patient in bed, awake, one leg out. RN reports he is intermittently trying to pull out IV, trying to climb out of bed. Spoke with his daughter earlier and decision has been made to transfer him to [facility] and proceed with hip fracture repair.  Review of Systems  Unable to perform ROS: Dementia  All other systems reviewed and are negative.   Length of Stay: 3  Current Medications: Scheduled Meds:  . acetaminophen  650 mg Oral Q8H  . Chlorhexidine Gluconate Cloth  6 each Topical Daily  . OLANZapine zydis  2.5 mg Oral QHS  . Warfarin - Pharmacist Dosing Inpatient   Does not apply q1800    Continuous Infusions: . ceFEPime (MAXIPIME) IV 2 g (03/14/19 1713)  . heparin 1,200 Units/hr (03/14/19 1534)    PRN Meds: acetaminophen **OR** acetaminophen, HYDROcodone-acetaminophen, ondansetron (ZOFRAN) IV  Physical Exam Vitals signs and nursing note reviewed.  Constitutional:      Appearance: He is ill-appearing.  Cardiovascular:     Rate and Rhythm: Normal rate.  Pulmonary:     Effort: Pulmonary effort is normal.  Neurological:     Mental Status: He is disoriented.     Comments: lethargic  Psychiatric:     Comments: Less verbal today             Vital Signs: BP 108/65   Pulse (!) 58   Temp 98.7 F (37.1 C) (Oral)   Resp (!) 22   Ht 6\' 2"  (1.88 m)   Wt 84.9 kg   SpO2 92%   BMI 24.03 kg/m  SpO2: SpO2: 92 % O2 Device: O2 Device: Nasal Cannula O2 Flow Rate: O2 Flow Rate  (L/min): 2 L/min  Intake/output summary:   Intake/Output Summary (Last 24 hours) at 03/14/2019 1916 Last data filed at 03/14/2019 1534 Gross per 24 hour  Intake 217.56 ml  Output -  Net 217.56 ml   LBM:   Baseline Weight: Weight: 79.4 kg Most recent weight: Weight: 84.9 kg       Palliative  Assessment/Data: PPS: 30%      Patient Active Problem List   Diagnosis Date Noted  . Sepsis due to undetermined organism (HCC) 03/13/2019  . Lobar pneumonia (HCC) 03/13/2019  . Thrombocytopenia (HCC) 03/13/2019  . Lower urinary tract infection   . Pressure injury of skin 03/12/2019  . Closed fracture of left hip (HCC)   . Goals of care, counseling/discussion   . Advanced care planning/counseling discussion   . Palliative care by specialist   . Sepsis (HCC) 03/11/2019  . Acute lower UTI 03/11/2019  . ARF (acute renal failure) (HCC) 03/11/2019  . HCAP (healthcare-associated pneumonia) 03/11/2019  . Acute metabolic encephalopathy 02/27/2019  . Confusion 02/26/2019  . Hematuria 02/26/2019  . Dementia (HCC)   . HOH (hard of hearing)   . Mitral valve disease   . BPH (benign prostatic hyperplasia)   . Bradycardia from beta blocker   . Hallucinations   . Secondary hypercoagulable state (HCC)   . Encounter for therapeutic drug monitoring 08/27/2013  . Long term (current) use of anticoagulants 11/30/2010  . CORONARY ATHEROSCLEROSIS NATIVE CORONARY ARTERY 01/01/2010  . MITRAL VALVE DISORDER 01/01/2010  . Aortic valve disorder 01/01/2010  . Essential hypertension, benign 09/11/2009  . Chronic atrial fibrillation 09/11/2009    Palliative Care Assessment & Plan   Patient Profile: 85 y.o. male  with past medical history of dementia with behavior disturbances, CAD, a fib, AVR and mitral valve repair (on anticoag), has visual and auditory hallucinations, recent admission for staph infection of skin/altered mental status possible related to UTI admitted on 03/11/2019 with pneumonia, urosepsis  and hip fracture. Palliative medicine consulted for GOC.   Assessment/Recommendations/Plan   Will start on low dose zyprexa for agitation- would avoid benzo- will start with 2.5mg  ODT daily Continue scheduled tylenol- can consider low dose morphine- .5mg -1mg  IV q2hr prn if needed- but again would avoid as patient is continued aggressive care and preparing for surgery and rehab   Prognosis:   Unable to determine  Discharge Planning:  Skilled Nursing Facility for rehab with Palliative care service follow-up  Care plan was discussed with patient's daughter, Leandro Reasonerlizabeth Liles.   Thank you for allowing the Palliative Medicine Team to assist in the care of this patient.   Time In: 1400 Time Out: 1425 Total Time 25minutes Prolonged Time Billed no      Greater than 50%  of this time was spent counseling and coordinating care related to the above assessment and plan.  Ocie BobKasie Mahan, AGNP-C Palliative Medicine   Please contact Palliative Medicine Team phone at [phone] for questions and concerns.

## 2019-03-14 NOTE — Progress Notes (Signed)
Report given to carelink. Pt resting comfortably in bed. Last vital signs are quite stable. Pt informed he would be going to [place] to have surgery. Although he is demented he is agreeable, and thanks me for taking care of him. Report called to [facility] at [facility] on [address]. Pts daughter called and given an update on transfer and pt location.  All paperwork and documents given to [facility] transport team.

## 2019-03-14 NOTE — Progress Notes (Addendum)
ANTICOAGULATION CONSULT NOTE -   Pharmacy Consult for: heparin dosing (warfarin on hold due to upcoming surgery) Indication: chronic atrial fibrillation   Patient Measurements: Height: 6\' 2"  (188 cm) Weight: 187 lb 2.7 oz (84.9 kg) IBW/kg (Calculated) : 82.2 Heparin Dosing Weight: HEPARIN DW (KG): 83.6  Vital Signs: Temp: 98.8 F (37.1 C) (08/27 1136) Temp Source: Axillary (08/27 1136) BP: 138/76 (08/27 1000) Pulse Rate: 78 (08/27 1000)  Labs: Recent Labs    03/11/19 1752 03/11/19 2019 03/12/19 0247 03/12/19 0739 03/13/19 0501 03/14/19 1124  HGB 15.2  --  11.8*  --  12.0* 12.0*  HCT 46.7  --  36.5*  --  37.3* 37.8*  PLT 143*  --  101*  --  109* 138*  APTT 40*  --   --   --   --   --   LABPROT 53.5*  --   --  63.9* 38.7* 20.8*  INR 6.1*  --   --  7.7* 4.0* 1.8*  CREATININE 1.52*  --  1.11  --  0.72  --   TROPONINIHS  --  43* 46*  --   --   --     Estimated Creatinine Clearance: 72.8 mL/min (by C-G formula based on SCr of 0.72 mg/dL).   Assessment: Pharmacy consulted to dose heparin for this 85 yo male on chronic warfarin with hip fracture and severe dementia. INR on admission 6.1 and elevated as high as 7.7 likely due to compliance/dietary issues as well as on cipro recently. Current INR is 1.8  Vit K 5mg  sub-q x1 8/25  Home warfarin dosing listed as 7.5 mg daily per anti-coag clinic   Goal of Therapy:  HL 0.3-0.7 INR 2-3 Monitor platelets by anticoagulation protocol: Yes   Plan:    Hold warfarin for upcoming surgery. Initiate heparin infusion at 1200 units/hr Monitor H&H + heparin level in 8 hours and daily.  Margot Ables, PharmD Clinical Pharmacist 03/14/2019 12:12 PM

## 2019-03-14 NOTE — Progress Notes (Addendum)
Palliative-  Received call from [facility]- patient is being transferred to [facility]. She has decided to proceed with surgery. She asked if she was "making right decision". Earnestine Leys support re: any decision she makes with compassion for her father is the "right decision". Continued to support her as she agonized over her emotional choice.   Thank you for involving Palliative medicine in this patient's care.   Mariana Kaufman, AGNP-C Palliative Medicine  Please call Palliative Medicine team phone with any questions [phone]. For individual providers please see AMION.  No charge

## 2019-03-14 NOTE — Progress Notes (Signed)
Pt yells at lab staff when coming in for blood draw. RN tries to explain what is going on and why it is important for his care. Pt is oriented back to baseline of this shift but still refuses for anyone to draw his bloodwork.

## 2019-03-15 ENCOUNTER — Inpatient Hospital Stay (HOSPITAL_COMMUNITY): Payer: Medicare Other | Admitting: Certified Registered Nurse Anesthetist

## 2019-03-15 ENCOUNTER — Inpatient Hospital Stay (HOSPITAL_COMMUNITY): Admission: RE | Admit: 2019-03-15 | Payer: Medicare Other | Source: Home / Self Care | Admitting: Orthopedic Surgery

## 2019-03-15 ENCOUNTER — Inpatient Hospital Stay (HOSPITAL_COMMUNITY): Payer: Medicare Other

## 2019-03-15 ENCOUNTER — Encounter (HOSPITAL_COMMUNITY)
Admission: EM | Disposition: A | Discharge status: Skilled Nursing Facility | Payer: Self-pay | Source: Home / Self Care | Attending: Internal Medicine

## 2019-03-15 ENCOUNTER — Other Ambulatory Visit: Payer: Self-pay

## 2019-03-15 ENCOUNTER — Encounter (HOSPITAL_COMMUNITY): Payer: Self-pay

## 2019-03-15 DIAGNOSIS — S72002K Fracture of unspecified part of neck of left femur, subsequent encounter for closed fracture with nonunion: Secondary | ICD-10-CM

## 2019-03-15 DIAGNOSIS — J181 Lobar pneumonia, unspecified organism: Secondary | ICD-10-CM

## 2019-03-15 HISTORY — PX: INTRAMEDULLARY (IM) NAIL INTERTROCHANTERIC: SHX5875

## 2019-03-15 LAB — CREATININE, SERUM
Creatinine, Ser: 0.84 mg/dL (ref 0.61–1.24)
GFR calc Af Amer: >60 mL/min (ref >60)
GFR calc non Af Amer: >60 mL/min (ref >60)

## 2019-03-15 LAB — CBC
HCT: 36.4 % — ABNORMAL LOW (ref 39.0–52.0)
Hemoglobin: 11.7 g/dL — ABNORMAL LOW (ref 13.0–17.0)
MCH: 30.7 pg (ref 26.0–34.0)
MCHC: 32.1 g/dL (ref 30.0–36.0)
MCV: 95.5 fL (ref 80.0–100.0)
Platelets: 123 10*3/uL — ABNORMAL LOW (ref 150–400)
RBC: 3.81 MIL/uL — ABNORMAL LOW (ref 4.22–5.81)
RDW: 13 % (ref 11.5–15.5)
WBC: 7.9 10*3/uL (ref 4.0–10.5)
nRBC: 0 % (ref 0.0–0.2)

## 2019-03-15 LAB — HEPARIN LEVEL (UNFRACTIONATED)
Heparin Unfractionated: 0.1 IU/mL — ABNORMAL LOW (ref 0.30–0.70)
Heparin Unfractionated: <0.1 IU/mL — ABNORMAL LOW (ref 0.30–0.70)

## 2019-03-15 LAB — SURGICAL PCR SCREEN
MRSA, PCR: NEGATIVE
Staphylococcus aureus: NEGATIVE

## 2019-03-15 SURGERY — FIXATION, FRACTURE, INTERTROCHANTERIC, WITH INTRAMEDULLARY ROD
Anesthesia: Spinal | Laterality: Left

## 2019-03-15 SURGERY — FIXATION, FRACTURE, INTERTROCHANTERIC, WITH INTRAMEDULLARY ROD
Anesthesia: General | Laterality: Left

## 2019-03-15 MED ORDER — ACETAMINOPHEN 325 MG PO TABS
325.0000 mg | ORAL_TABLET | Freq: Four times a day (QID) | ORAL | Status: DC | PRN
  Administered 2019-03-15 – 2019-03-20 (×4): 650 mg via ORAL
  Filled 2019-03-15 (×4): qty 2

## 2019-03-15 MED ORDER — ONDANSETRON HCL 4 MG PO TABS: 4.0000 mg | ORAL_TABLET | Freq: Four times a day (QID) | ORAL | Status: DC | PRN

## 2019-03-15 MED ORDER — ONDANSETRON HCL 4 MG/2ML IJ SOLN: 4.0000 mg | Freq: Once | INTRAMUSCULAR | Status: DC | PRN

## 2019-03-15 MED ORDER — ENSURE ENLIVE PO LIQD
237.0000 mL | Freq: Two times a day (BID) | ORAL | Status: DC
  Administered 2019-03-15 – 2019-03-20 (×10): 237 mL via ORAL

## 2019-03-15 MED ORDER — POVIDONE-IODINE 10 % EX SWAB: 2.0000 "application " | Freq: Once | CUTANEOUS | Status: DC

## 2019-03-15 MED ORDER — CEFAZOLIN SODIUM-DEXTROSE 2-3 GM-%(50ML) IV SOLR: INTRAVENOUS | Status: DC | PRN

## 2019-03-15 MED ORDER — PROPOFOL 10 MG/ML IV BOLUS
INTRAVENOUS | Status: DC | PRN
  Administered 2019-03-15 (×2): 20 mg via INTRAVENOUS

## 2019-03-15 MED ORDER — EPHEDRINE SULFATE-NACL 50-0.9 MG/10ML-% IV SOSY
PREFILLED_SYRINGE | INTRAVENOUS | Status: DC | PRN
  Administered 2019-03-15 (×2): 5 mg via INTRAVENOUS

## 2019-03-15 MED ORDER — SODIUM CHLORIDE 0.9 % IV SOLN
INTRAVENOUS | Status: DC | PRN
  Administered 2019-03-15: 40 ug/min via INTRAVENOUS

## 2019-03-15 MED ORDER — PHENYLEPHRINE 40 MCG/ML (10ML) SYRINGE FOR IV PUSH (FOR BLOOD PRESSURE SUPPORT)
PREFILLED_SYRINGE | INTRAVENOUS | Status: DC | PRN
  Administered 2019-03-15: 120 ug via INTRAVENOUS

## 2019-03-15 MED ORDER — 0.9 % SODIUM CHLORIDE (POUR BTL) OPTIME
TOPICAL | Status: DC | PRN
  Administered 2019-03-15: 10:00:00 1000 mL

## 2019-03-15 MED ORDER — METOCLOPRAMIDE HCL 5 MG PO TABS: 5.0000 mg | ORAL_TABLET | Freq: Three times a day (TID) | ORAL | Status: DC | PRN

## 2019-03-15 MED ORDER — METOCLOPRAMIDE HCL 5 MG/ML IJ SOLN: 5.0000 mg | Freq: Three times a day (TID) | INTRAMUSCULAR | Status: DC | PRN

## 2019-03-15 MED ORDER — HYDROCODONE-ACETAMINOPHEN 5-325 MG PO TABS
1.0000 | ORAL_TABLET | ORAL | Status: DC | PRN
  Administered 2019-03-15: 23:00:00 1 via ORAL
  Filled 2019-03-15: qty 1

## 2019-03-15 MED ORDER — SODIUM CHLORIDE 0.9 % IV SOLN
1.0000 g | INTRAVENOUS | Status: AC
  Administered 2019-03-15 – 2019-03-18 (×4): 1 g via INTRAVENOUS
  Filled 2019-03-15 (×4): qty 10

## 2019-03-15 MED ORDER — PROPOFOL 500 MG/50ML IV EMUL
INTRAVENOUS | Status: DC | PRN
  Administered 2019-03-15: 20 ug/kg/min via INTRAVENOUS

## 2019-03-15 MED ORDER — ENOXAPARIN SODIUM 40 MG/0.4ML ~~LOC~~ SOLN
40.0000 mg | SUBCUTANEOUS | Status: DC
  Administered 2019-03-16 – 2019-03-20 (×5): 40 mg via SUBCUTANEOUS
  Filled 2019-03-15 (×4): qty 0.4

## 2019-03-15 MED ORDER — WARFARIN - PHARMACIST DOSING INPATIENT: Freq: Every day | Status: DC

## 2019-03-15 MED ORDER — SENNA 8.6 MG PO TABS
1.0000 | ORAL_TABLET | Freq: Two times a day (BID) | ORAL | Status: DC
  Administered 2019-03-15 – 2019-03-20 (×8): 8.6 mg via ORAL
  Filled 2019-03-15 (×10): qty 1

## 2019-03-15 MED ORDER — CHLORHEXIDINE GLUCONATE 4 % EX LIQD: 60.0000 mL | Freq: Once | CUTANEOUS | Status: DC

## 2019-03-15 MED ORDER — TRANEXAMIC ACID-NACL 1000-0.7 MG/100ML-% IV SOLN
1000.0000 mg | INTRAVENOUS | Status: AC
  Administered 2019-03-15: 09:00:00 1000 mg via INTRAVENOUS

## 2019-03-15 MED ORDER — MORPHINE SULFATE (PF) 2 MG/ML IV SOLN: 0.5000 mg | INTRAVENOUS | Status: DC | PRN

## 2019-03-15 MED ORDER — DOCUSATE SODIUM 100 MG PO CAPS
100.0000 mg | ORAL_CAPSULE | Freq: Two times a day (BID) | ORAL | Status: DC
  Administered 2019-03-15 – 2019-03-19 (×7): 100 mg via ORAL
  Filled 2019-03-15 (×9): qty 1

## 2019-03-15 MED ORDER — FENTANYL CITRATE (PF) 100 MCG/2ML IJ SOLN: 25.0000 ug | INTRAMUSCULAR | Status: DC | PRN

## 2019-03-15 MED ORDER — ENSURE PRE-SURGERY PO LIQD: 296.0000 mL | Freq: Once | ORAL | Status: DC

## 2019-03-15 MED ORDER — MENTHOL 3 MG MT LOZG: 1.0000 | LOZENGE | OROMUCOSAL | Status: DC | PRN

## 2019-03-15 MED ORDER — VANCOMYCIN HCL IN DEXTROSE 1-5 GM/200ML-% IV SOLN
1000.0000 mg | Freq: Two times a day (BID) | INTRAVENOUS | Status: AC
  Administered 2019-03-15: 21:00:00 1000 mg via INTRAVENOUS
  Filled 2019-03-15: qty 200

## 2019-03-15 MED ORDER — WARFARIN SODIUM 7.5 MG PO TABS
7.5000 mg | ORAL_TABLET | Freq: Once | ORAL | Status: AC
  Administered 2019-03-15: 16:00:00 7.5 mg via ORAL
  Filled 2019-03-15: qty 1

## 2019-03-15 MED ORDER — VANCOMYCIN HCL IN DEXTROSE 1-5 GM/200ML-% IV SOLN
1000.0000 mg | INTRAVENOUS | Status: AC
  Administered 2019-03-15: 1000 mg via INTRAVENOUS

## 2019-03-15 MED ORDER — ONDANSETRON HCL 4 MG/2ML IJ SOLN: 4.0000 mg | Freq: Four times a day (QID) | INTRAMUSCULAR | Status: DC | PRN

## 2019-03-15 MED ORDER — PHENOL 1.4 % MT LIQD: 1.0000 | OROMUCOSAL | Status: DC | PRN

## 2019-03-15 MED ORDER — LACTATED RINGERS IV SOLN
INTRAVENOUS | Status: DC | PRN
  Administered 2019-03-15: 09:00:00 via INTRAVENOUS

## 2019-03-15 MED ORDER — TRANEXAMIC ACID-NACL 1000-0.7 MG/100ML-% IV SOLN
INTRAVENOUS | Status: AC
  Filled 2019-03-15: qty 100

## 2019-03-15 SURGICAL SUPPLY — 52 items
ALCOHOL 70% 16 OZ (MISCELLANEOUS) ×3 IMPLANT
BIT DRILL CANN LG 4.3MM (BIT) IMPLANT
BNDG COHESIVE 4X5 TAN STRL (GAUZE/BANDAGES/DRESSINGS) IMPLANT
CHLORAPREP W/TINT 26 (MISCELLANEOUS) ×3 IMPLANT
COVER PERINEAL POST (MISCELLANEOUS) ×3 IMPLANT
COVER SURGICAL LIGHT HANDLE (MISCELLANEOUS) ×3 IMPLANT
COVER WAND RF STERILE (DRAPES) ×3 IMPLANT
DERMABOND ADVANCED (GAUZE/BANDAGES/DRESSINGS) ×2
DERMABOND ADVANCED .7 DNX12 (GAUZE/BANDAGES/DRESSINGS) ×2 IMPLANT
DRAPE C-ARM 42X72 X-RAY (DRAPES) ×3 IMPLANT
DRAPE C-ARMOR (DRAPES) ×3 IMPLANT
DRAPE HALF SHEET 40X57 (DRAPES) ×3 IMPLANT
DRAPE IMP U-DRAPE 54X76 (DRAPES) ×6 IMPLANT
DRAPE STERI IOBAN 125X83 (DRAPES) ×3 IMPLANT
DRAPE U-SHAPE 47X51 STRL (DRAPES) ×6 IMPLANT
DRILL BIT CANN LG 4.3MM (BIT) ×3
DRSG AQUACEL AG ADV 3.5X 6 (GAUZE/BANDAGES/DRESSINGS) ×4 IMPLANT
DRSG MEPILEX BORDER 4X4 (GAUZE/BANDAGES/DRESSINGS) ×6 IMPLANT
DRSG PAD ABDOMINAL 8X10 ST (GAUZE/BANDAGES/DRESSINGS) IMPLANT
ELECT REM PT RETURN 9FT ADLT (ELECTROSURGICAL) ×3
ELECTRODE REM PT RTRN 9FT ADLT (ELECTROSURGICAL) ×1 IMPLANT
FACESHIELD WRAPAROUND (MASK) ×3 IMPLANT
FACESHIELD WRAPAROUND OR TEAM (MASK) ×1 IMPLANT
GLOVE BIO SURGEON STRL SZ8.5 (GLOVE) ×6 IMPLANT
GLOVE BIOGEL PI IND STRL 7.0 (GLOVE) IMPLANT
GLOVE BIOGEL PI IND STRL 8.5 (GLOVE) ×1 IMPLANT
GLOVE BIOGEL PI INDICATOR 7.0 (GLOVE) ×2
GLOVE BIOGEL PI INDICATOR 8.5 (GLOVE) ×2
GLOVE SURG ORTHO 8.5 STRL (GLOVE) ×4 IMPLANT
GOWN STRL REUS W/ TWL LRG LVL3 (GOWN DISPOSABLE) ×2 IMPLANT
GOWN STRL REUS W/TWL 2XL LVL3 (GOWN DISPOSABLE) ×3 IMPLANT
GOWN STRL REUS W/TWL LRG LVL3 (GOWN DISPOSABLE) ×4
GUIDEPIN 3.2X17.5 THRD DISP (PIN) ×2 IMPLANT
KIT TURNOVER KIT B (KITS) ×3 IMPLANT
MANIFOLD NEPTUNE II (INSTRUMENTS) ×3 IMPLANT
MARKER SKIN DUAL TIP RULER LAB (MISCELLANEOUS) ×3 IMPLANT
NAIL HIP FRACT 130D 11X180 (Screw) ×2 IMPLANT
NS IRRIG 1000ML POUR BTL (IV SOLUTION) ×3 IMPLANT
PACK GENERAL/GYN (CUSTOM PROCEDURE TRAY) ×3 IMPLANT
PACK UNIVERSAL I (CUSTOM PROCEDURE TRAY) ×3 IMPLANT
PAD ARMBOARD 7.5X6 YLW CONV (MISCELLANEOUS) ×6 IMPLANT
PADDING CAST ABS 4INX4YD NS (CAST SUPPLIES)
PADDING CAST ABS COTTON 4X4 ST (CAST SUPPLIES) IMPLANT
SCREW BONE CORTICAL 5.0X40 (Screw) ×2 IMPLANT
SCREW LAG 10.5MMX105MM HFN (Screw) ×2 IMPLANT
SUT MNCRL AB 3-0 PS2 27 (SUTURE) ×3 IMPLANT
SUT MON AB 2-0 CT1 27 (SUTURE) ×3 IMPLANT
SUT MON AB 2-0 CT1 36 (SUTURE) ×2 IMPLANT
SUT VIC AB 1 CT1 27 (SUTURE) ×2
SUT VIC AB 1 CT1 27XBRD ANBCTR (SUTURE) ×1 IMPLANT
TOWEL GREEN STERILE (TOWEL DISPOSABLE) ×3 IMPLANT
TOWEL GREEN STERILE FF (TOWEL DISPOSABLE) ×3 IMPLANT

## 2019-03-15 NOTE — Progress Notes (Addendum)
PROGRESS NOTE    Phillip Nolan  GMW:[id number] DOB: 08-Dec-1927 DOA: 03/11/2019 PCP: Wandra Feinsteinorum, Lisa L, MD    Brief Narrative:  85 year old male who presented after a fall.  He does have significant past medical history of coronary artery disease, heart failure, paroxysmal atrial fibrillation, mitral valve repair and BPH.  Patient sustained a mechanical fall at home and he was unable to stand back up due to pain and weakness, he remained on the floor for a prolonged period time until a caregiver assisted him.  He was evaluated by physical therapy at home, noted severe ambulatory dysfunction, his oximetry was 82% on room air.  On his initial physical examination his blood pressure was 91/60, heart rate 82, temperature 98.4, respiratory rate 22, oxygen saturation 96%.  Patient was awake and alert, his lungs had decreased breath sounds in the right base with positive bilateral rales, no wheezing, heart S1-S2 present, rhythmic, abdomen was soft, no lower extremity edema. Sodium 137, potassium 4.1, chloride 105, bicarb 22, glucose 172, BUN 74, creatinine 1.54, white count 9.4, hemoglobin 15.2, hematocrit 46.7, platelets 143.  SARS COVID-19 was negative.  Urinalysis had more than 50 white cells, more than 50 red cells, 30 protein.  Head and cervical CT with no acute changes.  His a chest x-ray had a large right upper lobe alveolar infiltrate.  Left hip with minimally displaced intertrochanteric fracture of the left femur EKG 85 bpm, right axis deviation, right bundle branch block, sinus rhythm, ST elevation in lead I aVL, ST depressions V1 through V3, T wave inversions lead I aVL.   Patient was admitted to the hospital with a working diagnosis of acute hypoxic respiratory failure due to right upper lobe pneumonia, complicated by sepsis, and a left internal trochanteric femur fracture.  Assessment & Plan:   Principal Problem:   Sepsis (HCC) Active Problems:   Essential hypertension, benign   Chronic  atrial fibrillation   Acute lower UTI   ARF (acute renal failure) (HCC)   HCAP (healthcare-associated pneumonia)   Pressure injury of skin   Closed fracture of left hip (HCC)   Goals of care, counseling/discussion   Advanced care planning/counseling discussion   Palliative care by specialist   Sepsis due to undetermined organism (HCC)   Lobar pneumonia (HCC)   Thrombocytopenia (HCC)   Lower urinary tract infection   1. Sepsis due to pneumonia, complicated with acute hypoxic respiratory failure.  Patient with no dyspnea, oxymetry is 95 % on room air, positive rales at the right side. WBC at 7,9, cultures have bee with no growth, will change antibiotic to ceftriaxone (patient allergic to penicillin). Continue to follow up cell count and temperature curve. Physical therapy evaluation.   2. Left intertrochanteric hip fracture. Patient is sp intramedullary fixation, will continue pain control, dvt prophylaxis and physical therapy.   3. Atrial fibrillation. Continue rate control, continue anticoagulation with warfarin per pharmacy protocol.  INR is 1,8.   4. HTN. Continue blood pressure monitoring.   5. Dementia . Rapid decline in cognitive abilities, will need further assistance at discharge. Her daughter changed code status to full code. Continue with olanzapine at night.   6. Left heal pressure ulcer. Continue with local wound care (present on admission) not able to stage.   No urinary symptoms, ruled out uti  DVT prophylaxis: warfarin   Code Status: full Family Communication: .I spoke with patient's daughter  at the bedside and all questions were addressed.  Disposition Plan/ discharge barriers: pending physical therapy evaluation  Body mass index is 25.47 kg/m. Malnutrition Type:      Malnutrition Characteristics:      Nutrition Interventions:     RN Pressure Injury Documentation: Pressure Injury 03/12/19 Heel Left;Medial Deep Tissue Injury - Purple or maroon  localized area of discolored intact skin or blood-filled blister due to damage of underlying soft tissue from pressure and/or shear. (Active)  03/12/19 0100  Location: Heel  Location Orientation: Left;Medial  Staging: Deep Tissue Injury - Purple or maroon localized area of discolored intact skin or blood-filled blister due to damage of underlying soft tissue from pressure and/or shear.  Wound Description (Comments):   Present on Admission: Yes     Consultants:   orthopedics  Procedures:   Intramedullary left femur fixation   Antimicrobials:       Subjective: Patient continue to have pain on the left side, worse with movement and touch, no nausea or vomiting, no chest pain or dyspnea.   Objective: Vitals:   03/15/19 1100 03/15/19 1115 03/15/19 1130 03/15/19 1144  BP: 140/81 129/80 134/77 127/79  Pulse: 66 81 82 82  Resp: (!) 29 (!) 31 (!) 23   Temp:   98.5 F (36.9 C) (!) 97.4 F (36.3 C)  TempSrc:    Oral  SpO2: 98% 100% 97% 98%  Weight:      Height:        Intake/Output Summary (Last 24 hours) at 03/15/2019 1420 Last data filed at 03/15/2019 1125 Gross per 24 hour  Intake 920.56 ml  Output 1950 ml  Net -1029.44 ml   Filed Weights   03/13/19 0500 03/14/19 0500 03/15/19 0600  Weight: 84.9 kg 84.9 kg 90 kg    Examination:   General: Not in pain or dyspnea, deconditioned  Neurology: Awake and alert, non focal,. Confused but easy to reorient.  E ENT: mild pallor, no icterus, oral mucosa moist Cardiovascular: No JVD. S1-S2 present, rhythmic, no gallops, rubs, or murmurs. No lower extremity edema. Pulmonary: positive breath sounds bilaterally,, no wheezing, right sided rhonchi and rales. Gastrointestinal. Abdomen with, no organomegaly, non tender, no rebound or guarding Skin. No rashes Musculoskeletal: no joint deformities/ tender at the surgical site.      Data Reviewed: I have personally reviewed following labs and imaging studies  CBC: Recent Labs   Lab 03/11/19 1752 03/12/19 0247 03/13/19 0501 03/14/19 1124 03/15/19 1229  WBC 9.4 7.5 7.0 7.5 7.9  NEUTROABS 8.0*  --   --   --   --   HGB 15.2 11.8* 12.0* 12.0* 11.7*  HCT 46.7 36.5* 37.3* 37.8* 36.4*  MCV 93.8 93.6 94.2 95.9 95.5  PLT 143* 101* 109* 138* 616*   Basic Metabolic Panel: Recent Labs  Lab 03/11/19 1752 03/12/19 0247 03/13/19 0501 03/14/19 1124 03/15/19 1229  NA 137 140 143 141  --   K 4.1 4.0 4.1 4.1  --   CL 105 116* 116* 113*  --   CO2 22 21* 21* 23  --   GLUCOSE 172* 139* 104* 127*  --   BUN 74* 69* 45* 33*  --   CREATININE 1.52* 1.11 0.72 0.70 0.84  CALCIUM 9.0 8.1* 8.5* 8.6*  --   MG  --   --  2.4 2.4  --    GFR: Estimated Creatinine Clearance: 69.3 mL/min (by C-G formula based on SCr of 0.84 mg/dL). Liver Function Tests: Recent Labs  Lab 03/11/19 1752 03/12/19 0247 03/13/19 0501 03/14/19 1124  AST 38 27 21 24   ALT 24 21 21  26  ALKPHOS 60 45 48 73  BILITOT 3.0* 1.8* 1.9* 1.9*  PROT 6.8 5.2* 5.5* 5.8*  ALBUMIN 3.2* 2.4* 2.5* 2.6*   No results for input(s): LIPASE, AMYLASE in the last 168 hours. No results for input(s): AMMONIA in the last 168 hours. Coagulation Profile: Recent Labs  Lab 03/11/19 1752 03/12/19 0739 03/13/19 0501 03/14/19 1124  INR 6.1* 7.7* 4.0* 1.8*   Cardiac Enzymes: No results for input(s): CKTOTAL, CKMB, CKMBINDEX, TROPONINI in the last 168 hours. BNP (last 3 results) No results for input(s): PROBNP in the last 8760 hours. HbA1C: No results for input(s): HGBA1C in the last 72 hours. CBG: No results for input(s): GLUCAP in the last 168 hours. Lipid Profile: No results for input(s): CHOL, HDL, LDLCALC, TRIG, CHOLHDL, LDLDIRECT in the last 72 hours. Thyroid Function Tests: No results for input(s): TSH, T4TOTAL, FREET4, T3FREE, THYROIDAB in the last 72 hours. Anemia Panel: No results for input(s): VITAMINB12, FOLATE, FERRITIN, TIBC, IRON, RETICCTPCT in the last 72 hours.    Radiology Studies: I have  reviewed all of the imaging during this hospital visit personally     Scheduled Meds: . docusate sodium  100 mg Oral BID  . [START ON 03/16/2019] enoxaparin (LOVENOX) injection  40 mg Subcutaneous Q24H  . OLANZapine zydis  2.5 mg Oral QHS  . senna  1 tablet Oral BID  . warfarin  7.5 mg Oral ONCE-1800  . Warfarin - Pharmacist Dosing Inpatient   Does not apply q1800   Continuous Infusions: . ceFEPime (MAXIPIME) IV 2 g (03/14/19 2356)  . vancomycin       LOS: 4 days         Annett Gula, MD

## 2019-03-15 NOTE — Op Note (Signed)
OPERATIVE REPORT  SURGEON: Rod Can, MD   ASSISTANT: Staff.  PREOPERATIVE DIAGNOSIS: Left intertrochanteric femur fracture.   POSTOPERATIVE DIAGNOSIS: Left intertrochanteric femur fracture.   PROCEDURE: Intramedullary fixation, Left femur.   IMPLANTS: Biomet Affixus Hip Fracture Nail, 11 by 180 mm, 130 degrees. 10.5 x 105 mm Hip Fracture Nail Lag Screw. 5 x 40 mm distal interlocking screw 1.  ANESTHESIA:  Spinal  ESTIMATED BLOOD LOSS:-50 mL    ANTIBIOTICS: 2 g Ancef. 1 g Vancomycin (h/o MRSA).  DRAINS: None.  COMPLICATIONS: None.   CONDITION: PACU - hemodynamically stable.   BRIEF CLINICAL NOTE: Phillip Nolan is a 85 y.o. male who presented with an intertrochanteric femur fracture. The patient was admitted to the hospitalist service and underwent perioperative risk stratification and medical optimization. The risks, benefits, and alternatives to the procedure were explained, and the patient elected to proceed.  PROCEDURE IN DETAIL: Surgical site was marked by myself. The patient was taken to the operating room and anesthesia was induced on the bed. The patient was then transferred to the [facility] table and the nonoperative lower extremity was scissored underneath the operative side. The fracture was reduced with traction, internal rotation, and adduction. The hip was prepped and draped in the normal sterile surgical fashion. Timeout was called verifying side and site of surgery. Preop antibiotics were given with 60 minutes of beginning the procedure.  Fluoroscopy was used to define the patient's anatomy. A 4 cm incision was made just proximal to the tip of the greater trochanter. The awl was used to obtain the standard starting point for a trochanteric entry nail under fluoroscopic control. The guidepin was placed. The entry reamer was used to open the proximal femur.  On the back table, the nail was assembled onto the jig. The nail was placed into the femur without any  difficulty. Through a separate stab incision, the cannula was placed down to the bone in preparation for the cephalomedullary device. A guidepin was placed into the femoral head using AP and lateral fluoroscopy views. The pin was measured, and then reaming was performed to the appropriate depth. The lag screw was inserted to the appropriate depth. The fracture was compressed through the jig. The setscrew was tightened and then loosened one quarter turn. A separate stab incision was created, and the distal interlocking screw was placed using standard AO technique. The jig was removed. Final AP and lateral fluoroscopy views were obtained to confirm fracture reduction and hardware placement. Tip apex distance was appropriate. There was no chondral penetration.  The wounds were copiously irrigated with saline. The wound was closed in layers with #1 Vicryl for the fascia, 2-0 Monocryl for the deep dermal layer, and 3-0 Monocryl subcuticular stitch. Glue was applied to the skin. Once the glue was fully hardened, sterile dressing was applied. The patient was then awakened from anesthesia and taken to the PACU in stable condition. Sponge needle and instrument counts were correct at the end of the case 2. There were no known complications.   We will readmit the patient to the hospitalist. Weightbearing status will be weightbearing as tolerated with a walker. We will resume coumadin tonight with lovenox bridge. The patient will work with physical therapy and undergo disposition planning.  Wound care to left foot.

## 2019-03-15 NOTE — Consult Note (Signed)
   [facility] CM Inpatient Consult   03/15/2019  Phillip Nolan 08-20-1927 [id number]  Patient screened for high risk score  [23%] for unplanned readmission score  And for less than 30 day readmission/hospitalizations to check if potential [place] [facility] services are needed.   Primary Care Provider is Benny Lennert, MD of [place].  Following progress notes from Palliative consult.  Please place a [facility] Care Management consult as appropriate and for questions contact:   Natividad Brood, RN BSN [facility] Liaison  [phone] business mobile phone Toll free office [phone]  Fax number: [phone] [email or web address]

## 2019-03-15 NOTE — Progress Notes (Signed)
Patient coughs when fluids given.  High risk for aspiration.  NPO at present for possible surgery.

## 2019-03-15 NOTE — Discharge Instructions (Signed)
 Dr. Marybella Ethier Adult Hip & Knee Specialist [facility] [address] [place], [place] [address] [phone]   POSTOPERATIVE DIRECTIONS    Hip Rehabilitation, Guidelines Following Surgery   WEIGHT BEARING Weight bearing as tolerated with assist device (walker, cane, etc) as directed, use it as long as suggested by your surgeon or therapist, typically at least 4-6 weeks.   HOME CARE INSTRUCTIONS  Remove items at home which could result in a fall. This includes throw rugs or furniture in walking pathways.  Continue medications as instructed at time of discharge.  You may have some home medications which will be placed on hold until you complete the course of blood thinner medication.  4 days after discharge, you may start showering. No tub baths or soaking your incisions. Do not put on socks or shoes without following the instructions of your caregivers.   Sit on chairs with arms. Use the chair arms to help push yourself up when arising.  Arrange for the use of a toilet seat elevator so you are not sitting low.   Walk with walker as instructed.  You may resume a sexual relationship in one month or when given the OK by your caregiver.  Use walker as long as suggested by your caregivers.  Avoid periods of inactivity such as sitting longer than an hour when not asleep. This helps prevent blood clots.  You may return to work once you are cleared by your surgeon.  Do not drive a car for 6 weeks or until released by your surgeon.  Do not drive while taking narcotics.  Wear elastic stockings for two weeks following surgery during the day but you may remove then at night.  Make sure you keep all of your appointments after your operation with all of your doctors and caregivers. You should call the office at the above phone number and make an appointment for approximately two weeks after the date of your surgery. Please pick up a stool softener and laxative  for home use as long as you are requiring pain medications.  ICE to the affected hip every three hours for 30 minutes at a time and then as needed for pain and swelling. Continue to use ice on the hip for pain and swelling from surgery. You may notice swelling that will progress down to the foot and ankle.  This is normal after surgery.  Elevate the leg when you are not up walking on it.   It is important for you to complete the blood thinner medication as prescribed by your doctor.  Continue to use the breathing machine which will help keep your temperature down.  It is common for your temperature to cycle up and down following surgery, especially at night when you are not up moving around and exerting yourself.  The breathing machine keeps your lungs expanded and your temperature down.  RANGE OF MOTION AND STRENGTHENING EXERCISES  These exercises are designed to help you keep full movement of your hip joint. Follow your caregiver's or physical therapist's instructions. Perform all exercises about fifteen times, three times per day or as directed. Exercise both hips, even if you have had only one joint replacement. These exercises can be done on a training (exercise) mat, on the floor, on a table or on a bed. Use whatever works the best and is most comfortable for you. Use music or television while you are exercising so that the exercises are a pleasant break in your day. This   will make your life better with the exercises acting as a break in routine you can look forward to.  Lying on your back, slowly slide your foot toward your buttocks, raising your knee up off the floor. Then slowly slide your foot back down until your leg is straight again.  Lying on your back spread your legs as far apart as you can without causing discomfort.  Lying on your side, raise your upper leg and foot straight up from the floor as far as is comfortable. Slowly lower the leg and repeat.  Lying on your back, tighten up the  muscle in the front of your thigh (quadriceps muscles). You can do this by keeping your leg straight and trying to raise your heel off the floor. This helps strengthen the largest muscle supporting your knee.  Lying on your back, tighten up the muscles of your buttocks both with the legs straight and with the knee bent at a comfortable angle while keeping your heel on the floor.   SKILLED REHAB INSTRUCTIONS: If the patient is transferred to a skilled rehab facility following release from the hospital, a list of the current medications will be sent to the facility for the patient to continue.  When discharged from the skilled rehab facility, please have the facility set up the patient's Home Health Physical Therapy prior to being released. Also, the skilled facility will be responsible for providing the patient with their medications at time of release from the facility to include their pain medication and their blood thinner medication. If the patient is still at the rehab facility at time of the two week follow up appointment, the skilled rehab facility will also need to assist the patient in arranging follow up appointment in our office and any transportation needs.  MAKE SURE YOU:  Understand these instructions.  Will watch your condition.  Will get help right away if you are not doing well or get worse.  Pick up stool softner and laxative for home use following surgery while on pain medications. Daily dry dressing changes as needed. In 4 days, you may remove your dressings and begin taking showers - no tub baths or soaking the incisions. Continue to use ice for pain and swelling after surgery. Do not use any lotions or creams on the incision until instructed by your surgeon.   

## 2019-03-15 NOTE — Anesthesia Preprocedure Evaluation (Signed)
Anesthesia Evaluation  Patient identified by MRN, date of birth, ID band Patient awake    Reviewed: Allergy & Precautions, NPO status , Patient's Chart, lab work & pertinent test results  Airway Mallampati: II   Neck ROM: Limited  Mouth opening: Limited Mouth Opening  Dental   Pulmonary former smoker,     + decreased breath sounds      Cardiovascular hypertension,  Rhythm:Irregular Rate:Normal     Neuro/Psych    GI/Hepatic   Endo/Other    Renal/GU      Musculoskeletal   Abdominal   Peds  Hematology   Anesthesia Other Findings   Reproductive/Obstetrics                             Anesthesia Physical Anesthesia Plan  ASA: III  Anesthesia Plan: Spinal   Post-op Pain Management:    Induction: Intravenous  PONV Risk Score and Plan: Ondansetron  Airway Management Planned: Natural Airway and Simple Face Mask  Additional Equipment:   Intra-op Plan:   Post-operative Plan:   Informed Consent: I have reviewed the patients History and Physical, chart, labs and discussed the procedure including the risks, benefits and alternatives for the proposed anesthesia with the patient or authorized representative who has indicated his/her understanding and acceptance.       Plan Discussed with: CRNA and Anesthesiologist  Anesthesia Plan Comments: (TEG global hemostasis shows normal coagulation with no evidence of residual warfarin or heparin effect. Will proceed with spinal anesthetic.  Roberts Gaudy )        Anesthesia Quick Evaluation

## 2019-03-15 NOTE — Progress Notes (Signed)
Orthopedic Tech Progress Note Patient Details:  Phillip Nolan 08/16/1927 [id number]  Patient ID: Phillip Nolan, male   DOB: February 18, 1928, 85 y.o.   MRN: [id number]  I talked to rn before applying ohf. She said pt has ams and it wouldn't be safe to put on the ohf.  Karolee Stamps 03/15/2019, 3:25 PM

## 2019-03-15 NOTE — Progress Notes (Signed)
Patient sent to OR with daughter walking down with patient.  Heparin drip stopped at 0650, OR and pharmacy notified

## 2019-03-15 NOTE — Progress Notes (Signed)
Left heel deep tissue pressure injury with skin intact.  To order wound care consult.  Heel protectors on both heels.  Float heels at all times.  Turn every 2 hours.

## 2019-03-15 NOTE — Progress Notes (Signed)
ANTICOAGULATION CONSULT NOTE   Pharmacy Consult for: Warfarin Indication: chronic atrial fibrillation   Patient Measurements: Height: 6\' 2"  (188 cm) Weight: 198 lb 6.6 oz (90 kg) IBW/kg (Calculated) : 82.2 Heparin Dosing Weight: HEPARIN DW (KG): 90  Vital Signs: Temp: 97.4 F (36.3 C) (08/28 1144) Temp Source: Oral (08/28 1144) BP: 127/79 (08/28 1144) Pulse Rate: 82 (08/28 1144)  Labs: Recent Labs    03/13/19 0501 03/14/19 1124 03/14/19 2326 03/15/19 1058  HGB 12.0* 12.0*  --   --   HCT 37.3* 37.8*  --   --   PLT 109* 138*  --   --   LABPROT 38.7* 20.8*  --   --   INR 4.0* 1.8*  --   --   HEPARINUNFRC  --   --  0.10* <0.10*  CREATININE 0.72 0.70  --   --     Estimated Creatinine Clearance: 72.8 mL/min (by C-G formula based on SCr of 0.7 mg/dL).  Assessment: 85 year old male now s/p hip surgery to resume warfarin as prior to admission for Afib  Vit K 5mg  sub-q x1 8/25  Home warfarin dosing listed as 7.5 mg daily per anti-coag clinic  Goal of Therapy:  INR 2 to 3 Monitor platelets by anticoagulation protocol: Yes   Plan:    Warfarin 7.5 mg po x 1 dose tonight at 1800 pm Daily INR  Thank you Anette Guarneri, PharmD

## 2019-03-15 NOTE — Plan of Care (Signed)
  Problem: Education: Goal: Knowledge of General Education information will improve Description: Including pain rating scale, medication(s)/side effects and non-pharmacologic comfort measures Outcome: Progressing   Problem: Clinical Measurements: Goal: Respiratory complications will improve Outcome: Progressing   Problem: Coping: Goal: Level of anxiety will decrease Outcome: Progressing   Problem: Pain Managment: Goal: General experience of comfort will improve Outcome: Progressing   Problem: Safety: Goal: Ability to remain free from injury will improve Outcome: Progressing   

## 2019-03-15 NOTE — Anesthesia Procedure Notes (Signed)
Procedure Name: MAC Date/Time: 03/15/2019 8:33 AM Performed by: Leonor Liv, CRNA Pre-anesthesia Checklist: Patient identified, Emergency Drugs available, Suction available, Patient being monitored and Timeout performed Oxygen Delivery Method: Simple face mask Placement Confirmation: positive ETCO2 Dental Injury: Teeth and Oropharynx as per pre-operative assessment

## 2019-03-15 NOTE — Progress Notes (Signed)
Initial Nutrition Assessment  DOCUMENTATION CODES:   Not applicable  INTERVENTION:  Provide Ensure Enlive po BID, each supplement provides 350 kcal and 20 grams of protein  Encourage adequate PO intake.   NUTRITION DIAGNOSIS:   Increased nutrient needs related to post-op healing as evidenced by estimated needs.  GOAL:   Patient will meet greater than or equal to 90% of their needs  MONITOR:   PO intake, Supplement acceptance, Skin, Weight trends, Labs, I & O's  REASON FOR ASSESSMENT:   Malnutrition Screening Tool    ASSESSMENT:   85 y.o. male with a history of dementia, bioprosthetic AVR 2011, nonobstructive CAD, hypertension, atrial fibrillation who had a ground-level fall. X-rays revealed a displaced left intertrochanteric femur fracture. Noted to have sepsis secondary to pneumonia   PROCEDURE (8/28): Intramedullary fixation, Left femur.   Pt unavailable during time of visit. RD unable to obtain pt nutrition history at this time. RD to order nutritional supplements to aid in caloric and protein needs as well as in post op healing.   Unable to complete Nutrition-Focused physical exam at this time.   Labs and medications reviewed.   Diet Order:   Diet Order            Diet Heart Room service appropriate? Yes; Fluid consistency: Thin  Diet effective now              EDUCATION NEEDS:   Not appropriate for education at this time  Skin:  Skin Assessment: Skin Integrity Issues: Skin Integrity Issues:: DTI, Incisions DTI: L heel Incisions: L hip  Last BM:  8/27  Height:   Ht Readings from Last 1 Encounters:  03/15/19 6\' 2"  (1.88 m)    Weight:   Wt Readings from Last 1 Encounters:  03/15/19 90 kg    Ideal Body Weight:  86.36 kg  BMI:  Body mass index is 25.47 kg/m.  Estimated Nutritional Needs:   Kcal:  2000-2200  Protein:  90-100 grams  Fluid:  >/= 2 L/day    Corrin Parker, MS, RD, LDN Pager # [phone] After hours/ weekend pager #  [phone]

## 2019-03-15 NOTE — Consult Note (Signed)
ORTHOPAEDIC CONSULTATION  REQUESTING PHYSICIAN: Arrien, Jimmy Picket,*  PCP:  Maryruth Hancock, MD  Chief Complaint: Left intertrochanteric femur fracture  HPI: Phillip Nolan is a 85 y.o. male with a history of dementia, bioprosthetic AVR 2011, nonobstructive CAD, hypertension, atrial fibrillation who had a ground-level fall on 03/09/2019.  He was unable to ambulate due to left hip pain for a few days.  On 03/11/2019 he was evaluated by home health physical therapy who recommended that he go to the emergency department.  He then went to [facility] emergency department, where x-rays revealed a displaced left intertrochanteric femur fracture.  In the emergency department, he was febrile and hypotensive.  He was noted to have sepsis secondary to pneumonia.  He was admitted to the hospitalist service and started on IV antibiotics.  He was seen by cardiology.  His sepsis cleared and his medical status improved.  Dr. Aline Brochure was the orthopedic surgeon who evaluated him at [facility]  He recommended transfer to [facility] for definitive treatment because of the patient's elevated risk due to his medical comorbidities.  I was asked to assume care by my partner Dr. Stann Mainland.  Past Medical History:  Diagnosis Date  . Aortic valve disease    Status post AVR 2011  . Atrial fibrillation ([place])   . BPH (benign prostatic hyperplasia)   . CHF (congestive heart failure) ([place])   . Coronary atherosclerosis of native coronary artery    Nonobstructive  . Dementia ([place])   . Essential hypertension   . History of bacteremia    Streptococcus gordonii bacteremia 2/11 - no clear vegetation by TEE  . HOH (hard of hearing)   . Mitral valve disease    Status post annular repair 2011   Past Surgical History:  Procedure Laterality Date  . Median sterntomy  4/11   Dr. Roxy Manns - AVR (27 mm Edwards pericardial) and MV repair  . MULTIPLE TOOTH EXTRACTIONS  4/11   (pre-op above)   Social History    Socioeconomic History  . Marital status: Married    Spouse name: Not on file  . Number of children: Not on file  . Years of education: Not on file  . Highest education level: Not on file  Occupational History  . Not on file  Social Needs  . Financial resource strain: Not on file  . Food insecurity    Worry: Not on file    Inability: Not on file  . Transportation needs    Medical: Not on file    Non-medical: Not on file  Tobacco Use  . Smoking status: Former Smoker    Types: Pipe, Cigars    Quit date: 07/18/1978    Years since quitting: 40.6  . Smokeless tobacco: Never Used  Substance and Sexual Activity  . Alcohol use: No    Alcohol/week: 0.0 standard drinks  . Drug use: No  . Sexual activity: Not on file  Lifestyle  . Physical activity    Days per week: Not on file    Minutes per session: Not on file  . Stress: Not on file  Relationships  . Social Herbalist on phone: Not on file    Gets together: Not on file    Attends religious service: Not on file    Active member of club or organization: Not on file    Attends meetings of clubs or organizations: Not on file    Relationship status: Not on file  Other Topics Concern  . Not on file  Social History Narrative   Married, [place]    Family History  Family history unknown: Yes   Allergies  Allergen Reactions  . Ciprofloxacin     Daughter reports caused more confusion, and INR to be abnormal  . Penicillins Rash    .Did it involve swelling of the face/tongue/throat, SOB, or low BP? {No Did it involve sudden or severe rash/hives, skin peeling, or any reaction on the inside of your mouth or nose? Yes Did you need to seek medical attention at a hospital or doctor's office? Unknown When did it last happen?Childhood If all above answers are "NO", may proceed with cephalosporin use.     Prior to Admission medications   Medication Sig Start Date End Date Taking? Authorizing Provider   acetaminophen (TYLENOL) 325 MG tablet Take 2 tablets (650 mg total) by mouth every 4 (four) hours as needed for headache or mild pain. 02/28/19  Yes Emokpae, Courage, MD  lisinopril (ZESTRIL) 2.5 MG tablet Take 1 tablet daily Patient taking differently: Take 2.5 mg by mouth daily.  02/28/19  Yes Shon HaleEmokpae, Courage, MD  warfarin (COUMADIN) 5 MG tablet Take 1.5 tablets (7.5 mg total) by mouth every morning. TAKE 1&1/2 TABLETS DAILY or as directed Patient taking differently: Take 7.5 mg by mouth every evening.  03/01/19  Yes Shon HaleEmokpae, Courage, MD   No results found.  Positive ROS: All other systems have been reviewed and were otherwise negative with the exception of those mentioned in the HPI and as above.  Physical Exam: General: Alert, no acute distress Cardiovascular: No pedal edema Respiratory: No cyanosis, no use of accessory musculature GI: No organomegaly, abdomen is soft and non-tender Skin: No lesions in the area of chief complaint Neurologic: Sensation intact distally Psychiatric: Patient is competent for consent with normal mood and affect Lymphatic: No axillary or cervical lymphadenopathy  MUSCULOSKELETAL: Examination of the left hip reveals no skin wounds or lesions.  He is shortened and externally rotated.  He has pain with logrolling.  He has palpable pulses.  He reports intact sensation.  He has positive motor function dorsiflexion, plantarflexion, and great toe extension.  He has an unstageable pressure sore over the left heel with hyperemia.  There is no skin breakdown currently.  Assessment: Displaced left intertrochanteric femur fracture. Multiple medical problems. Lower extremity pressure sore.  Plan: I discussed the findings with the patient and his daughter.  This is obviously a very difficult situation due to the patient's recent illness and his multiple medical problems.  He has a displaced intertrochanteric femur fracture that is not going to heal on its own.  We  discussed that in general, patients benefit from operative treatment.  We discussed that stabilization of the fracture provides pain control and allows for mobilization out of bed.  Additionally, feeding, toileting, and getting him dressed will be easier after surgery.  We discussed that nonoperative treatment is reserved for patients deemed so unstable that they would not survive a relatively quick operation.  Nonoperative treatment would almost certainly lead to his demise due to prolonged immobility.  There is risk with surgery, however we discussed that it is worth giving him a chance.  We did discuss the risk, benefits, and alternatives to intramedullary fixation of the left femur.  They understand that his 90-day mortality is roughly 30%.  They wish to proceed.  All questions were solicited and answered.  The risks, benefits, and alternatives were discussed  with the patient. There are risks associated with the surgery including, but not limited to, problems with anesthesia (death), infection, differences in leg length/angulation/rotation, fracture of bones, loosening or failure of implants, malunion, nonunion, hematoma (blood accumulation) which may require surgical drainage, blood clots, pulmonary embolism, nerve injury (foot drop), and blood vessel injury. The patient understands these risks and elects to proceed.   Jonette Pesa, MD Cell [phone]    03/15/2019 7:49 AM

## 2019-03-15 NOTE — Plan of Care (Signed)
°  Problem: Coping: °Goal: Level of anxiety will decrease °Outcome: Progressing °  °

## 2019-03-15 NOTE — Progress Notes (Signed)
Placed call to patient's daughter, Darden Palmer, to let her know that her father has arrived to our unit and plan is to go to surgery early in am.  Daughter from the onset was very anxious and implied to me that she is unaware that her father is having surgery.  Daughter seemed confused and incoherent to me regarding her father's treatment plan here at [facility], and reason for transfer from [facility].  Daughter seemed to have an anxiety/panic attack while on the phone with me, and Was screaming and crying.  This anxious behavior from Ms. Gillian Scarce is what was reported to me from [facility] transport team.  To notify Dr. Lyla Glassing early in the morning of daughter refusing to give consent for surgery and need to talk with family.Marland Kitchen

## 2019-03-15 NOTE — Anesthesia Procedure Notes (Signed)
Spinal  Patient location during procedure: OR Start time: 03/15/2019 8:40 AM End time: 03/15/2019 8:50 AM Staffing Anesthesiologist: Roberts Gaudy, MD Performed: anesthesiologist  Preanesthetic Checklist Completed: patient identified, site marked, surgical consent, pre-op evaluation, timeout performed, IV checked, risks and benefits discussed and monitors and equipment checked Spinal Block Patient position: left lateral decubitus Prep: DuraPrep Patient monitoring: heart rate, cardiac monitor, continuous pulse ox and blood pressure Approach: midline Location: L3-4 Injection technique: single-shot Needle Needle type: Tuohy  Needle gauge: 22 G Needle length: 9 cm Assessment Sensory level: T4 Additional Notes Clear CSF 1.6 cc 0.75% Bupivacaine injected easily

## 2019-03-15 NOTE — Progress Notes (Signed)
CSW acknowledges consult for SNF. The patient had surgery today and will require PT/OT evaluations. CSW will assist with disposition planning once the evaluations have been completed.   CSW will continue to follow.   Valisha Heslin, MSW, LCSW-A Clinical Social Worker [facility]  [phone] 

## 2019-03-15 NOTE — Plan of Care (Signed)
  Problem: Education: Goal: Knowledge of General Education information will improve Description: Including pain rating scale, medication(s)/side effects and non-pharmacologic comfort measures Outcome: Progressing   Problem: Clinical Measurements: Goal: Respiratory complications will improve Outcome: Progressing   Problem: Activity: Goal: Risk for activity intolerance will decrease Outcome: Progressing   Problem: Nutrition: Goal: Adequate nutrition will be maintained Outcome: Progressing   Problem: Coping: Goal: Level of anxiety will decrease Outcome: Progressing   Problem: Elimination: Goal: Will not experience complications related to urinary retention Outcome: Progressing   Problem: Pain Managment: Goal: General experience of comfort will improve Outcome: Progressing   Problem: Safety: Goal: Ability to remain free from injury will improve Outcome: Progressing   Problem: Skin Integrity: Goal: Risk for impaired skin integrity will decrease Outcome: Progressing   

## 2019-03-15 NOTE — Progress Notes (Signed)
Dr. Stann Mainland notified of patient's family refusing surgery this am.  OR is aware.  To follow.

## 2019-03-15 NOTE — Progress Notes (Addendum)
ANTICOAGULATION CONSULT NOTE   Pharmacy Consult for: Heparin dosing (warfarin on hold due to upcoming surgery) Indication: chronic atrial fibrillation   Patient Measurements: Height: 6\' 2"  (188 cm) Weight: 187 lb 2.7 oz (84.9 kg) IBW/kg (Calculated) : 82.2 Heparin Dosing Weight: HEPARIN DW (KG): 83.6  Vital Signs: Temp: 98.5 F (36.9 C) (08/27 2223) Temp Source: Oral (08/27 2223) BP: 143/71 (08/27 2223) Pulse Rate: 57 (08/27 2223)  Labs: Recent Labs    03/12/19 0247 03/12/19 0739 03/13/19 0501 03/14/19 1124 03/14/19 2326  HGB 11.8*  --  12.0* 12.0*  --   HCT 36.5*  --  37.3* 37.8*  --   PLT 101*  --  109* 138*  --   LABPROT  --  63.9* 38.7* 20.8*  --   INR  --  7.7* 4.0* 1.8*  --   HEPARINUNFRC  --   --   --   --  0.10*  CREATININE 1.11  --  0.72 0.70  --   TROPONINIHS 46*  --   --   --   --     Estimated Creatinine Clearance: 72.8 mL/min (by C-G formula based on SCr of 0.7 mg/dL).   Assessment: Pharmacy consulted to dose heparin for this 85 yo male on chronic warfarin with hip fracture and severe dementia. INR on admission 6.1 and elevated as high as 7.7 likely due to compliance/dietary issues as well as on cipro recently. Current INR is 1.8  Vit K 5mg  sub-q x1 8/25  Home warfarin dosing listed as 7.5 mg daily per anti-coag clinic  8/28 AM update: Heparin level sub-therapeutic No issues per RN  Goal of Therapy:  Heparin level 0.3-0.7 units/mL Monitor platelets by anticoagulation protocol: Yes   Plan:    Holding warfarin for upcoming surgery Increase heparin infusion to 1350 units/hr Re-check heparin level in 8 hours  Narda Bonds, PharmD, [place] Pharmacist Phone: [phone]

## 2019-03-15 NOTE — Progress Notes (Signed)
Followed up with Tiney Rouge who went to patient's room, patient was sleeping.    Tiney Rouge gave nurse his pager number for any follow-up care.

## 2019-03-15 NOTE — Transfer of Care (Signed)
Immediate Anesthesia Transfer of Care Note  Patient: Phillip Nolan  Procedure(s) Performed: INTRAMEDULLARY (IM) NAIL INTERTROCHANTRIC (Left )  Patient Location: PACU  Anesthesia Type:MAC and Spinal  Level of Consciousness: awake, alert  and patient cooperative  Airway & Oxygen Therapy: Patient Spontanous Breathing and Patient connected to nasal cannula oxygen  Post-op Assessment: Report given to RN and Post -op Vital signs reviewed and stable  Post vital signs: Reviewed and stable  Last Vitals:  Vitals Value Taken Time  BP 112/63 03/15/19 1032  Temp    Pulse 80 03/15/19 1039  Resp    SpO2 99 % 03/15/19 1039  Vitals shown include unvalidated device data.  Last Pain:  Vitals:   03/15/19 0259  TempSrc: Oral  PainSc:          Complications: No apparent anesthesia complications

## 2019-03-15 NOTE — Anesthesia Postprocedure Evaluation (Signed)
Anesthesia Post Note  Patient: Phillip Nolan  Procedure(s) Performed: INTRAMEDULLARY (IM) NAIL INTERTROCHANTRIC (Left )     Patient location during evaluation: PACU Anesthesia Type: Spinal Level of consciousness: oriented, patient cooperative and lethargic Pain management: pain level controlled Vital Signs Assessment: post-procedure vital signs reviewed and stable Respiratory status: spontaneous breathing, respiratory function stable and patient connected to nasal cannula oxygen Cardiovascular status: blood pressure returned to baseline and stable Postop Assessment: no headache, no backache and no apparent nausea or vomiting Anesthetic complications: no    Last Vitals:  Vitals:   03/15/19 1144 03/15/19 1428  BP: 127/79 (!) 153/71  Pulse: 82 (!) 49  Resp:    Temp: (!) 36.3 C 36.6 C  SpO2: 98% 95%    Last Pain:  Vitals:   03/15/19 1428  TempSrc: Oral  PainSc:                  Rhylen Pulido COKER

## 2019-03-16 DIAGNOSIS — I482 Chronic atrial fibrillation, unspecified: Secondary | ICD-10-CM

## 2019-03-16 LAB — CULTURE, BLOOD (ROUTINE X 2)
Culture: NO GROWTH
Culture: NO GROWTH
Special Requests: ADEQUATE

## 2019-03-16 LAB — BASIC METABOLIC PANEL
Anion gap: 8 (ref 5–15)
BUN: 21 mg/dL (ref 8–23)
CO2: 22 mmol/L (ref 22–32)
Calcium: 8.5 mg/dL — ABNORMAL LOW (ref 8.9–10.3)
Chloride: 108 mmol/L (ref 98–111)
Creatinine, Ser: 0.69 mg/dL (ref 0.61–1.24)
GFR calc Af Amer: >60 mL/min (ref >60)
GFR calc non Af Amer: >60 mL/min (ref >60)
Glucose, Bld: 98 mg/dL (ref 70–99)
Potassium: 4 mmol/L (ref 3.5–5.1)
Sodium: 138 mmol/L (ref 135–145)

## 2019-03-16 LAB — PROTIME-INR
INR: 1.3 — ABNORMAL HIGH (ref 0.8–1.2)
Prothrombin Time: 16.4 seconds — ABNORMAL HIGH (ref 11.4–15.2)

## 2019-03-16 LAB — CBC
HCT: 35.3 % — ABNORMAL LOW (ref 39.0–52.0)
Hemoglobin: 11.2 g/dL — ABNORMAL LOW (ref 13.0–17.0)
MCH: 30.4 pg (ref 26.0–34.0)
MCHC: 31.7 g/dL (ref 30.0–36.0)
MCV: 95.9 fL (ref 80.0–100.0)
Platelets: 131 10*3/uL — ABNORMAL LOW (ref 150–400)
RBC: 3.68 MIL/uL — ABNORMAL LOW (ref 4.22–5.81)
RDW: 13 % (ref 11.5–15.5)
WBC: 7 10*3/uL (ref 4.0–10.5)
nRBC: 0 % (ref 0.0–0.2)

## 2019-03-16 MED ORDER — ACETAMINOPHEN 325 MG PO TABS
650.0000 mg | ORAL_TABLET | Freq: Four times a day (QID) | ORAL | Status: DC
  Administered 2019-03-16 – 2019-03-19 (×9): 650 mg via ORAL
  Filled 2019-03-16 (×11): qty 2

## 2019-03-16 MED ORDER — IBUPROFEN 200 MG PO TABS: 200.0000 mg | ORAL_TABLET | ORAL | Status: DC | PRN

## 2019-03-16 MED ORDER — WARFARIN SODIUM 7.5 MG PO TABS
7.5000 mg | ORAL_TABLET | Freq: Once | ORAL | Status: AC
  Administered 2019-03-16: 17:00:00 7.5 mg via ORAL
  Filled 2019-03-16: qty 1

## 2019-03-16 NOTE — Progress Notes (Signed)
Pt has had multiple pvc's on tele monitor throughout the day. Pt asymptomatic. CCMD and Dr. Cathlean Sauer are aware.

## 2019-03-16 NOTE — Plan of Care (Signed)
  Problem: Education: Goal: Knowledge of General Education information will improve Description: Including pain rating scale, medication(s)/side effects and non-pharmacologic comfort measures 03/16/2019 0144 by Junius Roads, RN Outcome: Progressing 03/15/2019 2323 by Junius Roads, RN Outcome: Progressing   Problem: Clinical Measurements: Goal: Respiratory complications will improve Outcome: Progressing   Problem: Activity: Goal: Risk for activity intolerance will decrease 03/16/2019 0144 by Junius Roads, RN Outcome: Progressing 03/15/2019 2323 by Junius Roads, RN Outcome: Progressing   Problem: Nutrition: Goal: Adequate nutrition will be maintained 03/16/2019 0144 by Junius Roads, RN Outcome: Progressing 03/15/2019 2323 by Junius Roads, RN Outcome: Progressing

## 2019-03-16 NOTE — Plan of Care (Signed)

## 2019-03-16 NOTE — Plan of Care (Signed)
  Problem: Nutrition: Goal: Adequate nutrition will be maintained Outcome: Progressing   

## 2019-03-16 NOTE — Progress Notes (Signed)
ANTICOAGULATION CONSULT NOTE   Pharmacy Consult for: Warfarin Indication: atrial fibrillation   Patient Measurements: Height: 6\' 2"  (188 cm) Weight: 198 lb 6.6 oz (90 kg) IBW/kg (Calculated) : 82.2  Vital Signs: Temp: 98.3 F (36.8 C) (08/29 0732) Temp Source: Oral (08/29 0732) BP: 130/64 (08/29 0732) Pulse Rate: 85 (08/29 0732)  Labs: Recent Labs    03/14/19 1124 03/14/19 2326 03/15/19 1058 03/15/19 1229 03/16/19 0409  HGB 12.0*  --   --  11.7* 11.2*  HCT 37.8*  --   --  36.4* 35.3*  PLT 138*  --   --  123* 131*  LABPROT 20.8*  --   --   --  16.4*  INR 1.8*  --   --   --  1.3*  HEPARINUNFRC  --  0.10* <0.10*  --   --   CREATININE 0.70  --   --  0.84 0.69    Estimated Creatinine Clearance: 72.8 mL/min (by C-G formula based on SCr of 0.69 mg/dL).  Assessment: 89 y/oM on warfarin PTA for hx of atrial fibrillation. Pharmacy asked to resume warfarin s/p hip surgery on 8/28. INR was elevated on admission, Vitamin K 5mg  SQ x 1 given on 8/25.   PTA warfarin regimen: 7.5mg  PO daily per anti-coag clinic notes-LD PTA 8/23 at 1800  Today, 03/16/19: INR = 1.3  CBC: Hgb stable at 11.2, Pltc low but stable/improved to 131K  Goal of Therapy:  INR 2-3 Monitor platelets by anticoagulation protocol: Yes   Plan:    Warfarin 7.5mg  PO x 1  Enoxaparin 40mg  SQ q24h per ortho until INR => 2 Daily PT/INR Monitor closely for s/sx of bleeding   Lindell Spar, PharmD, BCPS Clinical Pharmacist  03/16/2019 10:34 AM

## 2019-03-16 NOTE — Evaluation (Signed)
Clinical/Bedside Swallow Evaluation Patient Details  Name: Jaxyn Rout MRN: [id number] Date of Birth: [birth year]  Today's Date: 03/16/2019 Time: SLP Start Time (ACUTE ONLY): 1430 SLP Stop Time (ACUTE ONLY): 1500 SLP Time Calculation (min) (ACUTE ONLY): 30 min  Past Medical History:  Past Medical History:  Diagnosis Date  . Aortic valve disease    Status post AVR 2011  . Atrial fibrillation ([place])   . BPH (benign prostatic hyperplasia)   . CHF (congestive heart failure) ([place])   . Coronary atherosclerosis of native coronary artery    Nonobstructive  . Dementia ([place])   . Essential hypertension   . History of bacteremia    Streptococcus gordonii bacteremia 2/11 - no clear vegetation by TEE  . HOH (hard of hearing)   . Mitral valve disease    Status post annular repair 2011   Past Surgical History:  Past Surgical History:  Procedure Laterality Date  . Median sterntomy  4/11   Dr. Roxy Manns - AVR (27 mm Edwards pericardial) and MV repair  . MULTIPLE TOOTH EXTRACTIONS  4/11   (pre-op above)   HPI:  85 yo male amitted to ED on 8/24 with fall, UTI. Pt fell on 8/22 and has been relatively immobile since. Imaging revealed L femur minimally displaced intertrochanteric fracture, s/p IM fixation L femur on 03/15/19. PMH includes HTN, falls, CAD, CHF, afib, AVR, MVR, dementia.   Assessment / Plan / Recommendation Clinical Impression  Patient presents with a mild-moderate oropharyngeal dysphagia with most significant findings being discoordinated pharyngeal swallow with inconsistent and at times incomplete laryngeal elevation, which led to coughing with thin liquids. Patient is edentulous but his daughter is present and is ordering foods that are soft and that he can tolerate. Secondary to h/o poor PO intake, advancing dementia (per daughter report), will not make any diet consistency changes but will perform MBS to r/o aspiration. SLP Visit Diagnosis: Dysphagia, unspecified (R13.10)     Aspiration Risk  Mild aspiration risk;Moderate aspiration risk    Diet Recommendation Regular;Thin liquid   Liquid Administration via: Cup;Straw Medication Administration: Whole meds with puree Supervision: Full supervision/cueing for compensatory strategies;Staff to assist with self feeding Compensations: Minimize environmental distractions;Slow rate;Small sips/bites Postural Changes: Seated upright at 90 degrees    Other  Recommendations Oral Care Recommendations: Oral care BID   Follow up Recommendations Skilled Nursing facility;24 hour supervision/assistance      Frequency and Duration min 2x/week  1 week       Prognosis Prognosis for Safe Diet Advancement: Good Barriers to Reach Goals: Cognitive deficits      Swallow Study   General Date of Onset: 03/16/19 HPI: 85 yo male amitted to ED on 8/24 with fall, UTI. Pt fell on 8/22 and has been relatively immobile since. Imaging revealed L femur minimally displaced intertrochanteric fracture, s/p IM fixation L femur on 03/15/19. PMH includes HTN, falls, CAD, CHF, afib, AVR, MVR, dementia. Type of Study: Bedside Swallow Evaluation Previous Swallow Assessment: N/A Diet Prior to this Study: Regular;Thin liquids Temperature Spikes Noted: No Respiratory Status: Room air History of Recent Intubation: No Behavior/Cognition: Alert;Cooperative;Pleasant mood;Confused Oral Cavity Assessment: Within Functional Limits Oral Care Completed by SLP: Yes Oral Cavity - Dentition: Edentulous Self-Feeding Abilities: Needs assist;Needs set up Patient Positioning: Upright in bed Baseline Vocal Quality: Normal Volitional Cough: Cognitively unable to elicit Volitional Swallow: Unable to elicit    Oral/Motor/Sensory Function Overall Oral Motor/Sensory Function: Within functional limits   Ice Chips     Thin Liquid Thin Liquid: Impaired  Presentation: Straw Pharyngeal  Phase Impairments: Suspected delayed Swallow;Other (comments)(discoordinated  laryngeal elevation and intermittent coughing)    Nectar Thick     Honey Thick     Puree Puree: Impaired Pharyngeal Phase Impairments: Suspected delayed Swallow   Solid     Solid: Not tested      Pablo LawrencePreston, John Tarrell 03/16/2019,5:55 PM  Angela NevinJohn T. Preston, MA, CCC-SLP Speech Therapy [facility]

## 2019-03-16 NOTE — Progress Notes (Signed)
Subjective: 1 Day Post-Op Procedure(s) (LRB): INTRAMEDULLARY (IM) NAIL INTERTROCHANTRIC (Left) Patient reports pain as mild.  Reports soreness into thigh and knee. No other c/o.  Objective: Vital signs in last 24 hours: Temp:  [97.4 F (36.3 C)-98.9 F (37.2 C)] 98.3 F (36.8 C) (08/29 0732) Pulse Rate:  [49-85] 85 (08/29 0732) Resp:  [17-31] 17 (08/29 0446) BP: (107-153)/(57-81) 130/64 (08/29 0732) SpO2:  [92 %-100 %] 92 % (08/29 0732)  Intake/Output from previous day: 08/28 0701 - 08/29 0700 In: 940 [P.O.:240; I.V.:700] Out: 3550 [Urine:3500; Blood:50] Intake/Output this shift: No intake/output data recorded.  Recent Labs    03/14/19 1124 03/15/19 1229 03/16/19 0409  HGB 12.0* 11.7* 11.2*   Recent Labs    03/15/19 1229 03/16/19 0409  WBC 7.9 7.0  RBC 3.81* 3.68*  HCT 36.4* 35.3*  PLT 123* 131*   Recent Labs    03/14/19 1124 03/15/19 1229 03/16/19 0409  NA 141  --  138  K 4.1  --  4.0  CL 113*  --  108  CO2 23  --  22  BUN 33*  --  21  CREATININE 0.70 0.84 0.69  GLUCOSE 127*  --  98  CALCIUM 8.6*  --  8.5*   Recent Labs    03/14/19 1124 03/16/19 0409  INR 1.8* 1.3*    Neurologically intact ABD soft Neurovascular intact Sensation intact distally Intact pulses distally Dorsiflexion/Plantar flexion intact Incision: dressing C/D/I and no drainage No cellulitis present Compartment soft no calf pain or sign of DVT L knee nontender, no effusion  Assessment/Plan: 1 Day Post-Op Procedure(s) (LRB): INTRAMEDULLARY (IM) NAIL INTERTROCHANTRIC (Left) Advance diet Up with therapy D/C IV fluids WBAT with walker DVT ppx: Coumadin with Lovenox bridge   Phillip Nolan 03/16/2019, 8:35 AM

## 2019-03-16 NOTE — Progress Notes (Addendum)
PROGRESS NOTE    Phillip Nolan  ACZ:[id number] DOB: 08/26/1927 DOA: 03/11/2019 PCP: Wandra Feinstein, MD    Brief Narrative:  85 year old male who presented after a fall.  He does have significant past medical history of coronary artery disease, heart failure, paroxysmal atrial fibrillation, mitral valve repair and BPH.  Patient sustained a mechanical fall at home and he was unable to stand back up due to pain and weakness, he remained on the floor for a prolonged period time until a caregiver assisted him.  He was evaluated by physical therapy at home, noted severe ambulatory dysfunction, his oximetry was 82% on room air.  On his initial physical examination his blood pressure was 91/60, heart rate 82, temperature 98.4, respiratory rate 22, oxygen saturation 96%.  Patient was awake and alert, his lungs had decreased breath sounds in the right base with positive bilateral rales, no wheezing, heart S1-S2 present, rhythmic, abdomen was soft, no lower extremity edema. Sodium 137, potassium 4.1, chloride 105, bicarb 22, glucose 172, BUN 74, creatinine 1.54, white count 9.4, hemoglobin 15.2, hematocrit 46.7, platelets 143.  SARS COVID-19 was negative.  Urinalysis had more than 50 white cells, more than 50 red cells, 30 protein.  Head and cervical CT with no acute changes.  His a chest x-ray had a large right upper lobe alveolar infiltrate.  Left hip with minimally displaced intertrochanteric fracture of the left femur EKG 85 bpm, right axis deviation, right bundle branch block, sinus rhythm, ST elevation in lead I aVL, ST depressions V1 through V3, T wave inversions lead I aVL.   Patient was admitted to the hospital with a working diagnosis of acute hypoxic respiratory failure due to right upper lobe pneumonia, complicated by sepsis, and a left internal trochanteric femur fracture.   Assessment & Plan:   Principal Problem:   Sepsis (HCC) Active Problems:   Essential hypertension, benign  Chronic atrial fibrillation   Acute lower UTI   ARF (acute renal failure) (HCC)   HCAP (healthcare-associated pneumonia)   Pressure injury of skin   Closed fracture of left hip (HCC)   Goals of care, counseling/discussion   Advanced care planning/counseling discussion   Palliative care by specialist   Sepsis due to undetermined organism (HCC)   Lobar pneumonia (HCC)   Thrombocytopenia (HCC)   Lower urinary tract infection    1. Sepsis due to pneumonia, complicated with acute hypoxic respiratory failure.  Stable WBC at 7,0, patient with no fever or dyspnea, oxymetry 97% on room air. Continue antibiotic therapy with ceftriaxone for 8 days total. Continue oxymetry monitoring. Will request a swallow evaluation and continue with aspiration precautions.   2. Left intertrochanteric hip fracture.  sp intramedullary fixation. Will continue pain control with acetaminophen, will avoid narcotics. Physical therapy evaluation.   3. Atrial fibrillation. Rate controlled, warfarin for anticoagulation. INR at 1.3 today. Continue to follow pharmacy protocol.   4. HTN. Stable blood pressure, systolic 115 to 130 mmHg.    5. Dementia with rapid decline in cognitive abilities. Will continue neuro checks per unit protocol, avoid narcotics and benzodiazepines, physical therapy evaluation. His daughter is at the bedside. Continue with olanzapine at night.   6. Left heal pressure ulcer. Local wound care (present on admission) not able to stage.   No urinary symptoms, ruled out uti  DVT prophylaxis: warfarin   Code Status: full Family Communication: .I spoke with patient's daughter  at the bedside and all questions were addressed.    Body mass index is 25.47  kg/m. Malnutrition Type:  Nutrition Problem: Increased nutrient needs Etiology: post-op healing   Malnutrition Characteristics:  Signs/Symptoms: estimated needs   Nutrition Interventions:  Interventions: Ensure Enlive (each  supplement provides 350kcal and 20 grams of protein)  RN Pressure Injury Documentation: Pressure Injury 03/12/19 Heel Left;Medial Deep Tissue Injury - Purple or maroon localized area of discolored intact skin or blood-filled blister due to damage of underlying soft tissue from pressure and/or shear. (Active)  03/12/19 0100  Location: Heel  Location Orientation: Left;Medial  Staging: Deep Tissue Injury - Purple or maroon localized area of discolored intact skin or blood-filled blister due to damage of underlying soft tissue from pressure and/or shear.  Wound Description (Comments):   Present on Admission: Yes     Consultants:   Orthopedics   Procedures:   Right hip internal fixation   Antimicrobials:   IV ceftriaxone     Subjective: Patient was noted to have difficulty eating, no agitation, but continue to have confusion. No nausea or vomiting, no chest pain or dyspnea. His daughter is at the bedside, requesting to avoid narcotics and benzodiazepines.   Objective: Vitals:   03/15/19 2012 03/15/19 2353 03/16/19 0446 03/16/19 0732  BP: 107/68 111/70 (!) 115/57 130/64  Pulse: 66 67 66 85  Resp: 20 17 17    Temp: 98.9 F (37.2 C) 98.7 F (37.1 C) 98.7 F (37.1 C) 98.3 F (36.8 C)  TempSrc: Oral Oral Oral Oral  SpO2: 95% 97% 97% 92%  Weight:      Height:        Intake/Output Summary (Last 24 hours) at 03/16/2019 1243 Last data filed at 03/16/2019 4193 Gross per 24 hour  Intake 290 ml  Output 1600 ml  Net -1310 ml   Filed Weights   03/13/19 0500 03/14/19 0500 03/15/19 0600  Weight: 84.9 kg 84.9 kg 90 kg    Examination:   General: Not in pain or dyspnea, deconditioned  Neurology: Awake and alert, non focal  E ENT: mild pallor, no icterus, oral mucosa moist Cardiovascular: No JVD. S1-S2 present, rhythmic, no gallops, rubs, or murmurs. No lower extremity edema. Pulmonary:  Positive breath sounds bilaterally, adequate air movement, no wheezing, no rhonchi, scattered  mild rales on the right side. Gastrointestinal. Abdomen with no organomegaly, non tender, no rebound or guarding Skin. No rashes Musculoskeletal: no joint deformities     Data Reviewed: I have personally reviewed following labs and imaging studies  CBC: Recent Labs  Lab 03/11/19 1752 03/12/19 0247 03/13/19 0501 03/14/19 1124 03/15/19 1229 03/16/19 0409  WBC 9.4 7.5 7.0 7.5 7.9 7.0  NEUTROABS 8.0*  --   --   --   --   --   HGB 15.2 11.8* 12.0* 12.0* 11.7* 11.2*  HCT 46.7 36.5* 37.3* 37.8* 36.4* 35.3*  MCV 93.8 93.6 94.2 95.9 95.5 95.9  PLT 143* 101* 109* 138* 123* 790*   Basic Metabolic Panel: Recent Labs  Lab 03/11/19 1752 03/12/19 0247 03/13/19 0501 03/14/19 1124 03/15/19 1229 03/16/19 0409  NA 137 140 143 141  --  138  K 4.1 4.0 4.1 4.1  --  4.0  CL 105 116* 116* 113*  --  108  CO2 22 21* 21* 23  --  22  GLUCOSE 172* 139* 104* 127*  --  98  BUN 74* 69* 45* 33*  --  21  CREATININE 1.52* 1.11 0.72 0.70 0.84 0.69  CALCIUM 9.0 8.1* 8.5* 8.6*  --  8.5*  MG  --   --  2.4  2.4  --   --    GFR: Estimated Creatinine Clearance: 72.8 mL/min (by C-G formula based on SCr of 0.69 mg/dL). Liver Function Tests: Recent Labs  Lab 03/11/19 1752 03/12/19 0247 03/13/19 0501 03/14/19 1124  AST 38 27 21 24   ALT 24 21 21 26   ALKPHOS 60 45 48 73  BILITOT 3.0* 1.8* 1.9* 1.9*  PROT 6.8 5.2* 5.5* 5.8*  ALBUMIN 3.2* 2.4* 2.5* 2.6*   No results for input(s): LIPASE, AMYLASE in the last 168 hours. No results for input(s): AMMONIA in the last 168 hours. Coagulation Profile: Recent Labs  Lab 03/11/19 1752 03/12/19 0739 03/13/19 0501 03/14/19 1124 03/16/19 0409  INR 6.1* 7.7* 4.0* 1.8* 1.3*   Cardiac Enzymes: No results for input(s): CKTOTAL, CKMB, CKMBINDEX, TROPONINI in the last 168 hours. BNP (last 3 results) No results for input(s): PROBNP in the last 8760 hours. HbA1C: No results for input(s): HGBA1C in the last 72 hours. CBG: No results for input(s): GLUCAP in the  last 168 hours. Lipid Profile: No results for input(s): CHOL, HDL, LDLCALC, TRIG, CHOLHDL, LDLDIRECT in the last 72 hours. Thyroid Function Tests: No results for input(s): TSH, T4TOTAL, FREET4, T3FREE, THYROIDAB in the last 72 hours. Anemia Panel: No results for input(s): VITAMINB12, FOLATE, FERRITIN, TIBC, IRON, RETICCTPCT in the last 72 hours.    Radiology Studies: I have reviewed all of the imaging during this hospital visit personally     Scheduled Meds: . docusate sodium  100 mg Oral BID  . enoxaparin (LOVENOX) injection  40 mg Subcutaneous Q24H  . feeding supplement (ENSURE ENLIVE)  237 mL Oral BID BM  . OLANZapine zydis  2.5 mg Oral QHS  . senna  1 tablet Oral BID  . warfarin  7.5 mg Oral ONCE-1800  . Warfarin - Pharmacist Dosing Inpatient   Does not apply q1800   Continuous Infusions: . cefTRIAXone (ROCEPHIN)  IV 1 g (03/15/19 1637)     LOS: 5 days        Mauricio Annett Gulaaniel Arrien, MD

## 2019-03-16 NOTE — Evaluation (Signed)
Physical Therapy Evaluation Patient Details Name: Phillip Nolan MRN: [id number] DOB: 08-03-1927 Today's Date: 03/16/2019   History of Present Illness  85 yo male amitted to ED on 8/24 with fall, UTI. Pt fell on 8/22 and has been relatively immobile since. Imaging revealed L femur minimally displaced intertrochanteric fracture, s/p IM fixation L femur on 03/15/19. PMH includes HTN, falls, CAD, CHF, afib, AVR, MVR, dementia.  Clinical Impression   Pt presents with generalized weakness, L hip and thigh pain, extreme difficulty performing mobility tasks, inability to ambulate due to weakness/fatigue/fear, and decreased activity tolerance. Pt to benefit from acute PT to address deficits. Pt required mod-max assist for all mobility, and pt was very fearful of falling during eval. Pt with DOE 3/4 upon return to bed, suspect due to fatigue and anxiety about mobility. HR max during session was 100 bpm per monitor. Pt to benefit from +2 assist from now on for safety and pt comfort. PT recommending SNF, pt's daughter wishes pt to d/c to SNF in [place] to be closer to her and to avoid pt having to move to another SNF in the future. PT to progress mobility as tolerated, and will continue to follow acutely.      Follow Up Recommendations SNF;Supervision/Assistance - 24 hour    Equipment Recommendations  None recommended by PT    Recommendations for Other Services       Precautions / Restrictions Precautions Precautions: Fall Restrictions Weight Bearing Restrictions: No LLE Weight Bearing: Weight bearing as tolerated      Mobility  Bed Mobility Overal bed mobility: Needs Assistance Bed Mobility: Supine to Sit;Sit to Supine     Supine to sit: Mod assist;HOB elevated Sit to supine: Max assist;HOB elevated   General bed mobility comments: Mod assist for supine to sit for bilateral LE managment, trunk elevation, and scooting to EOB. Max assist for sit to supine for LE lifting, trunk  lowering, and scooting pt up in bed with use of boost function and bed pads.  Transfers Overall transfer level: Needs assistance Equipment used: Rolling walker (2 wheeled) Transfers: Sit to/from [facility] Sit to Stand: Max assist Stand pivot transfers: Mod assist       General transfer comment: Max assist for sit to stand for power up, hip extension, steadying. Pt also with max assist to sit on [facility] for hip hinging and slow eccentric lowering to toilet. Mod assist for stand pivot to and from [facility] for BM for steadying, VC for sequencing LEs, and steadying RW.  Ambulation/Gait Ambulation/Gait assistance: (NT)              Stairs            Wheelchair Mobility    Modified Rankin (Stroke Patients Only)       Balance Overall balance assessment: Needs assistance Sitting-balance support: Feet supported;No upper extremity supported Sitting balance-Leahy Scale: Fair Sitting balance - Comments: able to sit unsupported once pt has achieved sitting   Standing balance support: During functional activity;Bilateral upper extremity supported Standing balance-Leahy Scale: Poor Standing balance comment: requires RW and PT assist for standing balance                             Pertinent Vitals/Pain Pain Assessment: Faces Faces Pain Scale: Hurts even more Pain Location: L hip and thigh Pain Descriptors / Indicators: Discomfort;Grimacing Pain Intervention(s): Limited activity within patient's tolerance;Monitored during session;Repositioned    Home Living Family/patient  expects to be discharged to:: Skilled nursing facility(pt's daughter at bedside states "the only option is rehab")               Home Equipment: Dan HumphreysWalker - 2 wheels;Shower seat      Prior Function Level of Independence: Needs assistance   Gait / Transfers Assistance Needed: pt ambulated with RW PTA, but has not been walking since fall on 8/22.  ADL's / Homemaking Assistance  Needed: Pt was living with son and daughter in law, who were assisting with ADLs. Pt also getting HHPT prior to admission.        Hand Dominance   Dominant Hand: Right    Extremity/Trunk Assessment   Upper Extremity Assessment Upper Extremity Assessment: Generalized weakness    Lower Extremity Assessment Lower Extremity Assessment: Generalized weakness;LLE deficits/detail LLE Deficits / Details: able to perform AA heel slide, quad set, ankle pumps LLE Sensation: WNL    Cervical / Trunk Assessment Cervical / Trunk Assessment: Normal  Communication   Communication: HOH  Cognition Arousal/Alertness: Awake/alert Behavior During Therapy: WFL for tasks assessed/performed Overall Cognitive Status: History of cognitive impairments - at baseline Area of Impairment: Attention;Safety/judgement;Memory;Following commands;Problem solving                   Current Attention Level: Selective Memory: Decreased short-term memory Following Commands: Follows one step commands consistently Safety/Judgement: Decreased awareness of safety   Problem Solving: Decreased initiation;Difficulty sequencing General Comments: pleasant with PT, baseline of dementia      General Comments      Exercises General Exercises - Lower Extremity Heel Slides: AAROM;Left;5 reps;Supine   Assessment/Plan    PT Assessment Patient needs continued PT services  PT Problem List Decreased strength;Decreased mobility;Decreased safety awareness;Decreased range of motion;Decreased activity tolerance;Decreased balance;Decreased knowledge of use of DME;Pain;Decreased cognition       PT Treatment Interventions DME instruction;Therapeutic activities;Gait training;Therapeutic exercise;Patient/family education;Balance training;Functional mobility training    PT Goals (Current goals can be found in the Care Plan section)  Acute Rehab PT Goals PT Goal Formulation: With patient/family Time For Goal Achievement:  03/23/19 Potential to Achieve Goals: Good    Frequency Min 2X/week   Barriers to discharge        Co-evaluation               AM-PAC PT "6 Clicks" Mobility  Outcome Measure Help needed turning from your back to your side while in a flat bed without using bedrails?: A Lot Help needed moving from lying on your back to sitting on the side of a flat bed without using bedrails?: A Lot Help needed moving to and from a bed to a chair (including a wheelchair)?: A Lot Help needed standing up from a chair using your arms (e.g., wheelchair or bedside chair)?: A Lot Help needed to walk in hospital room?: Total Help needed climbing 3-5 steps with a railing? : Total 6 Click Score: 10    End of Session Equipment Utilized During Treatment: Gait belt Activity Tolerance: Patient limited by fatigue;Patient limited by pain Patient left: with family/visitor present;in chair;with call bell/phone within reach;with chair alarm set Nurse Communication: Mobility status PT Visit Diagnosis: Unsteadiness on feet (R26.81);Other abnormalities of gait and mobility (R26.89);Muscle weakness (generalized) (M62.81)    Time: [id number] PT Time Calculation (min) (ACUTE ONLY): 39 min   Charges:   PT Evaluation $PT Eval Low Complexity: 1 Low PT Treatments $Therapeutic Activity: 23-37 mins       Elchonon Maxson D Valton Schwartz, PT Acute Rehabilitation  Services Pager [phone]  Office [phone]   Tyrone Apple D Despina Hidden 03/16/2019, 5:05 PM

## 2019-03-17 ENCOUNTER — Inpatient Hospital Stay (HOSPITAL_COMMUNITY): Payer: Medicare Other

## 2019-03-17 LAB — PROTIME-INR
INR: 1.8 — ABNORMAL HIGH (ref 0.8–1.2)
Prothrombin Time: 20.2 seconds — ABNORMAL HIGH (ref 11.4–15.2)

## 2019-03-17 LAB — CBC
HCT: 35 % — ABNORMAL LOW (ref 39.0–52.0)
Hemoglobin: 11.3 g/dL — ABNORMAL LOW (ref 13.0–17.0)
MCH: 30.3 pg (ref 26.0–34.0)
MCHC: 32.3 g/dL (ref 30.0–36.0)
MCV: 93.8 fL (ref 80.0–100.0)
Platelets: 138 10*3/uL — ABNORMAL LOW (ref 150–400)
RBC: 3.73 MIL/uL — ABNORMAL LOW (ref 4.22–5.81)
RDW: 13.2 % (ref 11.5–15.5)
WBC: 8.3 10*3/uL (ref 4.0–10.5)
nRBC: 0 % (ref 0.0–0.2)

## 2019-03-17 LAB — BASIC METABOLIC PANEL
Anion gap: 7 (ref 5–15)
BUN: 15 mg/dL (ref 8–23)
CO2: 24 mmol/L (ref 22–32)
Calcium: 8.5 mg/dL — ABNORMAL LOW (ref 8.9–10.3)
Chloride: 107 mmol/L (ref 98–111)
Creatinine, Ser: 0.75 mg/dL (ref 0.61–1.24)
GFR calc Af Amer: >60 mL/min (ref >60)
GFR calc non Af Amer: >60 mL/min (ref >60)
Glucose, Bld: 116 mg/dL — ABNORMAL HIGH (ref 70–99)
Potassium: 3.5 mmol/L (ref 3.5–5.1)
Sodium: 138 mmol/L (ref 135–145)

## 2019-03-17 MED ORDER — WARFARIN SODIUM 5 MG PO TABS
5.0000 mg | ORAL_TABLET | Freq: Once | ORAL | Status: AC
  Administered 2019-03-17: 19:00:00 5 mg via ORAL
  Filled 2019-03-17: qty 1

## 2019-03-17 MED ORDER — RESOURCE THICKENUP CLEAR PO POWD
ORAL | Status: DC | PRN
  Filled 2019-03-17: qty 125

## 2019-03-17 NOTE — TOC Progression Note (Signed)
Transition of Care [facility]) - Progression Note    Patient Details  Name: Phillip Nolan MRN: [id number] Date of Birth: 1927-12-20  Transition [facility] Care [facility]) CM/SW [place], [place] Phone Number: [phone] 03/17/2019, 3:31 PM  Clinical Narrative:     CSW met with patient's daughter to inform [facility] and daughter selected facility. CSW attempted to call Gerald Stabs at the [facility] and had to leave a message to find out about bed availability.  A CSW will continue to follow for discharge planning.  Expected Discharge Plan: Skilled Nursing Facility Barriers to Discharge: Family Issues, Continued Medical Work up  Expected Discharge Plan and Services Expected Discharge Plan: [place] In-house Referral: Clinical Social Work                                             Social Determinants of Health (SDOH) Interventions    Readmission Risk Interventions No flowsheet data found.

## 2019-03-17 NOTE — NC FL2 (Signed)
[place] LEVEL OF CARE SCREENING TOOL     IDENTIFICATION  Patient Name: Nyaire Denbleyker Birthdate: 13-Feb-1928 Sex: male Admission Date (Current Location): 03/11/2019  [facility] [place] Number:  [employer] and Address:         Provider Number: [id number]  Attending Physician Name and Address:  Little Ishikawa, MD  Relative Name and Phone Number:  Benjamine Mola [phone]    Current Level of Care: Hospital Recommended Level of Care: [place] Prior Approval Number:    Date Approved/Denied:   PASRR Number: [id number]  Discharge Plan: SNF    Current Diagnoses: Patient Active Problem List   Diagnosis Date Noted  . Sepsis due to undetermined organism ([place]) 03/13/2019  . Lobar pneumonia ([place]) 03/13/2019  . Thrombocytopenia ([place]) 03/13/2019  . Lower urinary tract infection   . Pressure injury of skin 03/12/2019  . Closed fracture of left hip ([place])   . Goals of care, counseling/discussion   . Advanced care planning/counseling discussion   . Palliative care by specialist   . Sepsis ([place]) 03/11/2019  . Acute lower UTI 03/11/2019  . ARF (acute renal failure) ([place]) 03/11/2019  . HCAP (healthcare-associated pneumonia) 03/11/2019  . Acute metabolic encephalopathy 69/67/8938  . Confusion 02/26/2019  . Hematuria 02/26/2019  . Dementia ([place])   . HOH (hard of hearing)   . Mitral valve disease   . BPH (benign prostatic hyperplasia)   . Bradycardia from beta blocker   . Hallucinations   . Secondary hypercoagulable state ([place])   . Encounter for therapeutic drug monitoring 08/27/2013  . Long term (current) use of anticoagulants 11/30/2010  . CORONARY ATHEROSCLEROSIS NATIVE CORONARY ARTERY 01/01/2010  . MITRAL VALVE DISORDER 01/01/2010  . Aortic valve disorder 01/01/2010  . Essential hypertension, benign 09/11/2009  . Chronic atrial fibrillation 09/11/2009    Orientation RESPIRATION BLADDER Height & Weight     Self  O2(2l) Incontinent Weight: 198 lb 6.6 oz (90 kg) Height:  6\' 2"  (188 cm)  BEHAVIORAL SYMPTOMS/MOOD NEUROLOGICAL BOWEL NUTRITION STATUS      Incontinent Diet(Heart Healthy)  AMBULATORY STATUS COMMUNICATION OF NEEDS Skin   Extensive Assist Verbally Bruising, Skin abrasions(elbows, knees)                       Personal Care Assistance Level of Assistance  Bathing, Feeding, Dressing Bathing Assistance: Limited assistance Feeding assistance: Independent Dressing Assistance: Limited assistance     Functional Limitations Info  Sight, Hearing, Speech Sight Info: Adequate Hearing Info: Impaired Speech Info: Impaired    SPECIAL CARE FACTORS FREQUENCY  PT (By licensed PT)     PT Frequency: 5 times a week              Contractures Contractures Info: Not present    Additional Factors Info  Code Status Code Status Info: Full Allergies Info: Cipro, Penicillin           Current Medications (03/17/2019):  This is the current hospital active medication list Current Facility-Administered Medications  Medication Dose Route Frequency Provider Last Rate Last Dose  . acetaminophen (TYLENOL) tablet 325-650 mg  325-650 mg Oral Q6H PRN Rod Can, MD   650 mg at 03/16/19 0942  . acetaminophen (TYLENOL) tablet 650 mg  650 mg Oral Q6H Arrien, Jimmy Picket, MD   650 mg at 03/17/19 0602  . cefTRIAXone (ROCEPHIN) 1 g in sodium chloride 0.9 % 100 mL IVPB  1 g Intravenous Q24H Arrien, Jimmy Picket, MD 200 mL/hr  at 03/16/19 1639 1 g at 03/16/19 1639  . docusate sodium (COLACE) capsule 100 mg  100 mg Oral BID Samson FredericSwinteck, Brian, MD   100 mg at 03/16/19 2148  . enoxaparin (LOVENOX) injection 40 mg  40 mg Subcutaneous Q24H Samson FredericSwinteck, Brian, MD   40 mg at 03/16/19 0942  . feeding supplement (ENSURE ENLIVE) (ENSURE ENLIVE) liquid 237 mL  237 mL Oral BID BM Arrien, York RamMauricio Daniel, MD   237 mL at 03/16/19 1400  . menthol-cetylpyridinium (CEPACOL) lozenge 3 mg  1 lozenge Oral PRN Swinteck,  Arlys JohnBrian, MD       Or  . phenol (CHLORASEPTIC) mouth spray 1 spray  1 spray Mouth/Throat PRN Swinteck, Arlys JohnBrian, MD      . metoCLOPramide (REGLAN) tablet 5-10 mg  5-10 mg Oral Q8H PRN Swinteck, Arlys JohnBrian, MD       Or  . metoCLOPramide (REGLAN) injection 5-10 mg  5-10 mg Intravenous Q8H PRN Swinteck, Arlys JohnBrian, MD      . OLANZapine zydis (ZYPREXA) disintegrating tablet 2.5 mg  2.5 mg Oral QHS Samson FredericSwinteck, Brian, MD   2.5 mg at 03/16/19 2148  . ondansetron (ZOFRAN) injection 4 mg  4 mg Intravenous Q6H PRN Samson FredericSwinteck, Brian, MD      . Resource ThickenUp Clear   Oral PRN Azucena FallenLancaster, William C, MD      . senna Mancel Parsons(SENOKOT) tablet 8.6 mg  1 tablet Oral BID Samson FredericSwinteck, Brian, MD   8.6 mg at 03/16/19 2148  . warfarin (COUMADIN) tablet 5 mg  5 mg Oral ONCE-1800 Jamse MeadGadhia, Jigna M, [facility] TenayaRPH      . Warfarin - Pharmacist Dosing Inpatient   Does not apply q1800 Arrien, York RamMauricio Daniel, MD         Discharge Medications: Please see discharge summary for a list of discharge medications.  Relevant Imaging Results:  Relevant Lab Results:   Additional Information ss#[id number]  Patrice ParadiseLisa E Omni Dunsworth, LCSW

## 2019-03-17 NOTE — Progress Notes (Signed)
Subjective: 2 Days Post-Op Procedure(s) (LRB): INTRAMEDULLARY (IM) NAIL INTERTROCHANTRIC (Left) Patient reports pain as mild.   Daughter is present today at the time of exam. She provides the information for me. He is doing okay today. More agitated than normal. Not eating. She tells me that he is not complaining of any hip and or leg pain today.  Objective: Vital signs in last 24 hours: Temp:  [99.3 F (37.4 C)-99.4 F (37.4 C)] 99.4 F (37.4 C) (08/30 0346) Pulse Rate:  [64-80] 80 (08/30 0346) Resp:  [20] 20 (08/30 0346) BP: (127-145)/(75-84) 127/75 (08/30 0346) SpO2:  [92 %] 92 % (08/30 0346)  Intake/Output from previous day: 08/29 0701 - 08/30 0700 In: 610 [P.O.:610] Out: -  Intake/Output this shift: No intake/output data recorded.  Recent Labs    03/15/19 1229 03/16/19 0409 03/17/19 0423  HGB 11.7* 11.2* 11.3*   Recent Labs    03/16/19 0409 03/17/19 0423  WBC 7.0 8.3  RBC 3.68* 3.73*  HCT 35.3* 35.0*  PLT 131* 138*   Recent Labs    03/16/19 0409 03/17/19 0423  NA 138 138  K 4.0 3.5  CL 108 107  CO2 22 24  BUN 21 15  CREATININE 0.69 0.75  GLUCOSE 98 116*  CALCIUM 8.5* 8.5*   Recent Labs    03/16/19 0409 03/17/19 0423  INR 1.3* 1.8*    Neurovascular intact Sensation intact distally Intact pulses distally Dorsiflexion/Plantar flexion intact Incision: dressing C/D/I I am able to flex, abduct, adduct patients hip with no pain. He is able to flex and extend lefft knee with no pain.    Assessment/Plan: 2 Days Post-Op Procedure(s) (LRB): INTRAMEDULLARY (IM) NAIL INTERTROCHANTRIC (Left) Advance diet Up with therapy, WBAT with walker DVT ppx: Coumadin with Lovenox bridge They are working on getting a placement for patient at this time.     [place] 03/17/2019, 3:07 PM

## 2019-03-17 NOTE — Progress Notes (Signed)
Modified Barium Swallow Progress Note  Patient Details  Name: Phillip Nolan MRN: [id number] Date of Birth: 07/08/1928  Today's Date: 03/17/2019  Modified Barium Swallow completed.  Full report located under Chart Review in the Imaging Section.  Brief recommendations include the following:  Clinical Impression Pt presents with moderate oropharyngeal dysphagia, characterized orally by extended oral prep (likely due to absence of dentition), poor bolus formation, and premature spillage.   Pharyngeally, pt exhibits delayed swallow reflex with trigger noted at the level of the pyriform sinus on thin and nectar thick consistencies and at the vallecular sinus on puree and solid consistencies.   Penetration and aspiration of thin liquids was seen during the swallow, with inconsistent cough response. Nectar thick liquids are tolerated in small individual sips, however, large and consecutive boluses are penetrated and/or aspirated with inconsistent cough response.   Puree and solid consistencies were tolerated without penetration, but with post-swallow vallecular and pyriform sinus residue. Pt has difficulty swallowing a second time to clear residue.   Pt was unable to demonstrate ability to swallow a barium tablet whole, so crushed meds will be recommended.  At this time, recommend soft/chopped solids with nectar thick liquids. Pt should have full supervision during meals for assist with adherence to safe swallow precautions. Pt was sent back to his room with safe swallow strategies to post at [facility]. SLP will follow for assessment of diet tolerance and education.   Swallow Evaluation Recommendations  SLP Diet Recommendations: Dysphagia 3 (Mech soft) solids;Nectar thick liquid   Liquid Administration via: Cup;Straw(small individual sips)   Medication Administration: Crushed with puree   Supervision: Patient able to self feed;Staff to assist with self feeding;Full supervision/cueing for  compensatory strategies   Compensations: Minimize environmental distractions;Slow rate;Small sips/bites;Clear throat intermittently   Postural Changes: Remain semi-upright after after feeds/meals (Comment);Seated upright at 90 degrees   Oral Care Recommendations: Oral care QID   Other Recommendations: Order thickener from [place]. Quentin Ore, [facility], [place] Speech Language Pathologist [phone]  Shonna Chock 03/17/2019,10:36 AM

## 2019-03-17 NOTE — TOC Progression Note (Signed)
Transition of Care [facility]) - Progression Note    Patient Details  Name: Phillip Nolan MRN: [id number] Date of Birth: 1928/04/30  [facility] CM/SW [place], LCSW Phone Number: [phone] 03/17/2019, 11:23 AM  Clinical Narrative:     CSW spoke with patient's daughter Benjamine Mola in regards of the OT recommendation for a SNF. Benjamine Mola stated that she was aware of the recommendation and would like the patient to go to the [facility] of [place] due to wanting him to transfer into their LTC. CSW received permission to fax out to the [facility].  Benjamine Mola informed CSW that she was on her way up to the patient's room. CSW informed her that she would follow up with her later in the day.  CCW will continue to follow for discharge planning.  Expected Discharge Plan: Skilled Nursing Facility Barriers to Discharge: Family Issues, Continued Medical Work up  Expected Discharge Plan and Services Expected Discharge Plan: [place] In-house Referral: Clinical Social Work                                             Social Determinants of Health (SDOH) Interventions    Readmission Risk Interventions No flowsheet data found.

## 2019-03-17 NOTE — Progress Notes (Addendum)
PROGRESS NOTE    Phillip Nolan  KMQ:[id number] DOB: 29-Sep-1927 DOA: 03/11/2019 PCP: Wandra Feinstein, MD    Brief Narrative:   85 year old male who presented after a fall.  He does have significant past medical history of coronary artery disease, heart failure, paroxysmal atrial fibrillation, mitral valve repair and BPH.  Patient sustained a mechanical fall at home and he was unable to stand back up due to pain and weakness, he remained on the floor for a prolonged period time until a caregiver assisted him.  He was evaluated by physical therapy at home, noted severe ambulatory dysfunction, his oximetry was 82% on room air.   On his initial physical examination his blood pressure was 91/60, heart rate 82, temperature 98.4, respiratory rate 22, oxygen saturation 96%.  Patient was awake and alert, his lungs had decreased breath sounds in the right base with positive bilateral rales, no wheezing, heart S1-S2 present, rhythmic, abdomen was soft, no lower extremity edema. Sodium 137, potassium 4.1, chloride 105, bicarb 22, glucose 172, BUN 74, creatinine 1.54, white count 9.4, hemoglobin 15.2, hematocrit 46.7, platelets 143.  SARS COVID-19 was negative.  Urinalysis had more than 50 white cells, more than 50 red cells, 30 protein.  Head and cervical CT with no acute changes.  His a chest x-ray had a large right upper lobe alveolar infiltrate.  Left hip with minimally displaced intertrochanteric fracture of the left femur. EKG 85 bpm, right axis deviation, right bundle branch block, sinus rhythm, ST elevation in lead I aVL, ST depressions V1 through V3, T wave inversions lead I aVL.   Patient was admitted to the hospital with a working diagnosis of acute hypoxic respiratory failure due to right upper lobe pneumonia, complicated by sepsis, and a left internal trochanteric femur fracture.   Assessment & Plan:   Principal Problem:   Sepsis (HCC) Active Problems:   Essential hypertension, benign  Chronic atrial fibrillation   Acute lower UTI   ARF (acute renal failure) (HCC)   HCAP (healthcare-associated pneumonia)   Pressure injury of skin   Closed fracture of left hip (HCC)   Goals of care, counseling/discussion   Advanced care planning/counseling discussion   Palliative care by specialist   Sepsis due to undetermined organism (HCC)   Lobar pneumonia (HCC)   Thrombocytopenia (HCC)   Lower urinary tract infection    Sepsis due to community acquired pneumonia, complicated with acute hypoxic respiratory failure.   TMax 102 on 8/24 - afebrile since Without leukocytosis Vanc/flagyl/cefepime at admission - de-escalated to ceftriaxone Abx start day 8/24 - continue for 8 days total - stop date 03/18/19 OFS today - follow speech therapy: Dysphagia 3 (Mech soft) solids;Nectar thick liquid UTI ruled out as below  Left intertrochanteric hip fracture.  sp intramedullary fixation. Pain controlled, continue PT Avoid narcotics  Atrial fibrillation, rate controlled on warfarin Pharmacy following for warfarin dosing INR 1.8  HTN, essential Well controlled  Dementia with rapid decline in cognitive abilities.  Avoid narcotics and benzos given high risk for delirium and sundowning  Continue olanzapine at night.   Left heal pressure ulcer.  Local wound care (present on admission) not able to stage.   No urinary symptoms, ruled out uti  DVT prophylaxis: warfarin   Code Status: full Family Communication: .Daughter at bedside  Body mass index is 25.47 kg/m. Malnutrition Type:  Nutrition Problem: Increased nutrient needs Etiology: post-op healing   Malnutrition Characteristics:  Signs/Symptoms: estimated needs   Nutrition Interventions:  Interventions: Ensure Enlive (each  supplement provides 350kcal and 20 grams of protein)  RN Pressure Injury Documentation: Pressure Injury 03/12/19 Heel Left;Medial Deep Tissue Injury - Purple or maroon localized area of  discolored intact skin or blood-filled blister due to damage of underlying soft tissue from pressure and/or shear. (Active)  03/12/19 0100  Location: Heel  Location Orientation: Left;Medial  Staging: Deep Tissue Injury - Purple or maroon localized area of discolored intact skin or blood-filled blister due to damage of underlying soft tissue from pressure and/or shear.  Wound Description (Comments):   Present on Admission: Yes     Consultants:   Orthopedics   Procedures:   Right hip internal fixation   Antimicrobials:   IV ceftriaxone - stop date 8/31   Subjective: No acute issues or events overnight, denies chest pain, shortness of breath, nausea, vomiting, diarrhea, constipation, headache, fever, chills.  Objective: Vitals:   03/16/19 0446 03/16/19 0732 03/16/19 1942 03/17/19 0346  BP: (!) 115/57 130/64 (!) 145/84 127/75  Pulse: 66 85 64 80  Resp: 17  20 20   Temp: 98.7 F (37.1 C) 98.3 F (36.8 C) 99.3 F (37.4 C) 99.4 F (37.4 C)  TempSrc: Oral Oral Oral Oral  SpO2: 97% 92% 92% 92%  Weight:      Height:        Intake/Output Summary (Last 24 hours) at 03/17/2019 0726 Last data filed at 03/16/2019 1422 Gross per 24 hour  Intake 560 ml  Output --  Net 560 ml   Filed Weights   03/13/19 0500 03/14/19 0500 03/15/19 0600  Weight: 84.9 kg 84.9 kg 90 kg    Examination:   General: Not in pain or dyspnea, deconditioned  Neurology: Awake and alert, non focal  E ENT: mild pallor, no icterus, oral mucosa moist Cardiovascular: No JVD. S1-S2 present, rhythmic, no gallops, rubs, or murmurs. No lower extremity edema. Pulmonary:  Positive breath sounds bilaterally, adequate air movement, no wheezing, no rhonchi, scattered mild rales on the right side. Gastrointestinal. Abdomen with no organomegaly, non tender, no rebound or guarding Skin. No rashes Musculoskeletal: no joint deformities     Data Reviewed: I have personally reviewed following labs and imaging  studies  CBC: Recent Labs  Lab 03/11/19 1752  03/13/19 0501 03/14/19 1124 03/15/19 1229 03/16/19 0409 03/17/19 0423  WBC 9.4   < > 7.0 7.5 7.9 7.0 8.3  NEUTROABS 8.0*  --   --   --   --   --   --   HGB 15.2   < > 12.0* 12.0* 11.7* 11.2* 11.3*  HCT 46.7   < > 37.3* 37.8* 36.4* 35.3* 35.0*  MCV 93.8   < > 94.2 95.9 95.5 95.9 93.8  PLT 143*   < > 109* 138* 123* 131* 138*   < > = values in this interval not displayed.   Basic Metabolic Panel: Recent Labs  Lab 03/12/19 0247 03/13/19 0501 03/14/19 1124 03/15/19 1229 03/16/19 0409 03/17/19 0423  NA 140 143 141  --  138 138  K 4.0 4.1 4.1  --  4.0 3.5  CL 116* 116* 113*  --  108 107  CO2 21* 21* 23  --  22 24  GLUCOSE 139* 104* 127*  --  98 116*  BUN 69* 45* 33*  --  21 15  CREATININE 1.11 0.72 0.70 0.84 0.69 0.75  CALCIUM 8.1* 8.5* 8.6*  --  8.5* 8.5*  MG  --  2.4 2.4  --   --   --  GFR: Estimated Creatinine Clearance: 72.8 mL/min (by C-G formula based on SCr of 0.75 mg/dL). Liver Function Tests: Recent Labs  Lab 03/11/19 1752 03/12/19 0247 03/13/19 0501 03/14/19 1124  AST 38 27 21 24   ALT 24 21 21 26   ALKPHOS 60 45 48 73  BILITOT 3.0* 1.8* 1.9* 1.9*  PROT 6.8 5.2* 5.5* 5.8*  ALBUMIN 3.2* 2.4* 2.5* 2.6*   Coagulation Profile: Recent Labs  Lab 03/12/19 0739 03/13/19 0501 03/14/19 1124 03/16/19 0409 03/17/19 0423  INR 7.7* 4.0* 1.8* 1.3* 1.8*    Radiology Studies: I have reviewed all of the imaging during this hospital visit personally  Scheduled Meds:  acetaminophen  650 mg Oral Q6H   docusate sodium  100 mg Oral BID   enoxaparin (LOVENOX) injection  40 mg Subcutaneous Q24H   feeding supplement (ENSURE ENLIVE)  237 mL Oral BID BM   OLANZapine zydis  2.5 mg Oral QHS   senna  1 tablet Oral BID   Warfarin - Pharmacist Dosing Inpatient   Does not apply q1800   Continuous Infusions:  cefTRIAXone (ROCEPHIN)  IV 1 g (03/16/19 1639)     LOS: 6 days   Azucena FallenWilliam C Jayin Derousse, MD

## 2019-03-17 NOTE — Progress Notes (Signed)
ANTICOAGULATION CONSULT NOTE   Pharmacy Consult for: Warfarin Indication: atrial fibrillation   Patient Measurements: Height: 6\' 2"  (188 cm) Weight: 198 lb 6.6 oz (90 kg) IBW/kg (Calculated) : 82.2  Vital Signs: Temp: 99.4 F (37.4 C) (08/30 0346) Temp Source: Oral (08/30 0346) BP: 127/75 (08/30 0346) Pulse Rate: 80 (08/30 0346)  Labs: Recent Labs    03/14/19 1124 03/14/19 2326 03/15/19 1058 03/15/19 1229 03/16/19 0409 03/17/19 0423  HGB 12.0*  --   --  11.7* 11.2* 11.3*  HCT 37.8*  --   --  36.4* 35.3* 35.0*  PLT 138*  --   --  123* 131* 138*  LABPROT 20.8*  --   --   --  16.4* 20.2*  INR 1.8*  --   --   --  1.3* 1.8*  HEPARINUNFRC  --  0.10* <0.10*  --   --   --   CREATININE 0.70  --   --  0.84 0.69 0.75    Estimated Creatinine Clearance: 72.8 mL/min (by C-G formula based on SCr of 0.75 mg/dL).  Assessment: 89 y/oM on warfarin PTA for hx of atrial fibrillation. Pharmacy asked to resume warfarin s/p hip surgery on 8/28. INR was elevated on admission, Vitamin K 5mg  SQ x 1 given on 8/25.   PTA warfarin regimen: 7.5mg  PO daily per anti-coag clinic notes-LD PTA 8/23 at 1800  Today, 03/17/19: INR increased to 1.8  CBC: Hgb stable at 11.3, Pltc low but stable/improved to 138K  Goal of Therapy:  INR 2-3 Monitor platelets by anticoagulation protocol: Yes   Plan:    Warfarin 5mg  PO x 1  Enoxaparin 40mg  SQ q24h per ortho until INR => 2 Daily PT/INR Monitor closely for s/sx of bleeding   Lindell Spar, PharmD, BCPS Clinical Pharmacist  03/17/2019 8:46 AM

## 2019-03-18 ENCOUNTER — Encounter (HOSPITAL_COMMUNITY): Payer: Self-pay | Admitting: Orthopedic Surgery

## 2019-03-18 DIAGNOSIS — L89159 Pressure ulcer of sacral region, unspecified stage: Secondary | ICD-10-CM

## 2019-03-18 LAB — CBC
HCT: 33.5 % — ABNORMAL LOW (ref 39.0–52.0)
Hemoglobin: 10.8 g/dL — ABNORMAL LOW (ref 13.0–17.0)
MCH: 30.8 pg (ref 26.0–34.0)
MCHC: 32.2 g/dL (ref 30.0–36.0)
MCV: 95.4 fL (ref 80.0–100.0)
Platelets: 154 10*3/uL (ref 150–400)
RBC: 3.51 MIL/uL — ABNORMAL LOW (ref 4.22–5.81)
RDW: 13.5 % (ref 11.5–15.5)
WBC: 8.3 10*3/uL (ref 4.0–10.5)
nRBC: 0 % (ref 0.0–0.2)

## 2019-03-18 LAB — PROTIME-INR
INR: 1.7 — ABNORMAL HIGH (ref 0.8–1.2)
Prothrombin Time: 19.9 seconds — ABNORMAL HIGH (ref 11.4–15.2)

## 2019-03-18 MED ORDER — WARFARIN SODIUM 7.5 MG PO TABS
7.5000 mg | ORAL_TABLET | Freq: Once | ORAL | Status: AC
  Administered 2019-03-18: 18:00:00 7.5 mg via ORAL
  Filled 2019-03-18: qty 1

## 2019-03-18 MED ORDER — OLANZAPINE 5 MG PO TBDP
5.0000 mg | ORAL_TABLET | Freq: Every day | ORAL | Status: DC
  Filled 2019-03-18 (×2): qty 1

## 2019-03-18 NOTE — Progress Notes (Signed)
PROGRESS NOTE    Phillip Nolan  IHK:[id number] DOB: Jan 03, 1928 DOA: 03/11/2019 PCP: Maryruth Hancock, MD    Brief Narrative:   85 year old male who presented after a fall.  He does have significant past medical history of coronary artery disease, heart failure, paroxysmal atrial fibrillation, mitral valve repair and BPH.  Patient sustained a mechanical fall at home and he was unable to stand back up due to pain and weakness, he remained on the floor for a prolonged period time until a caregiver assisted him.  He was evaluated by physical therapy at home, noted severe ambulatory dysfunction, his oximetry was 82% on room air.   On his initial physical examination his blood pressure was 91/60, heart rate 82, temperature 98.4, respiratory rate 22, oxygen saturation 96%.  Patient was awake and alert, his lungs had decreased breath sounds in the right base with positive bilateral rales, no wheezing, heart S1-S2 present, rhythmic, abdomen was soft, no lower extremity edema. Sodium 137, potassium 4.1, chloride 105, bicarb 22, glucose 172, BUN 74, creatinine 1.54, white count 9.4, hemoglobin 15.2, hematocrit 46.7, platelets 143.  SARS COVID-19 was negative.  Urinalysis had more than 50 white cells, more than 50 red cells, 30 protein.  Head and cervical CT with no acute changes.  His a chest x-ray had a large right upper lobe alveolar infiltrate.  Left hip with minimally displaced intertrochanteric fracture of the left femur. EKG 85 bpm, right axis deviation, right bundle branch block, sinus rhythm, ST elevation in lead I aVL, ST depressions V1 through V3, T wave inversions lead I aVL.   Patient was admitted to the hospital with a working diagnosis of acute hypoxic respiratory failure due to right upper lobe pneumonia, complicated by sepsis, and a left internal trochanteric femur fracture.   Assessment & Plan:   Principal Problem:   Sepsis ([place]) Active Problems:   Essential hypertension, benign  Chronic atrial fibrillation   Acute lower UTI   ARF (acute renal failure) (HCC)   HCAP (healthcare-associated pneumonia)   Pressure injury of skin   Closed fracture of left hip (HCC)   Goals of care, counseling/discussion   Advanced care planning/counseling discussion   Palliative care by specialist   Sepsis due to undetermined organism ([place])   Lobar pneumonia ([place])   Thrombocytopenia ([place])   Lower urinary tract infection  Sepsis due to community acquired pneumonia, complicated with acute hypoxic respiratory failure, resolved - T-Max 102 on 03/11/19 - afebrile since - Without leukocytosis - Vanc/flagyl/cefepime at admission - de-escalated to ceftriaxone - Abx stop date 03/18/19 - OFS 03/17/19: Dysphagia 3 (Mech soft) solids;Nectar thick liquid - UTI ruled out as below  Left intertrochanteric hip fracture.  sp intramedullary fixation. - Pain controlled, continue PT - Avoid narcotics  Atrial fibrillation, rate controlled on warfarin - Pharmacy following for warfarin dosing - INR 1.7  HTN, essential - Well controlled  Dementia with rapid decline in cognitive abilities.  - Avoid narcotics and benzos given high risk for delirium and sundowning  - Continue olanzapine at night - increasing dose to 5mg  given events overnight  Left heal pressure ulcer.  - Local wound care (present on admission) not able to stage.   No urinary symptoms, ruled out UTI  DVT prophylaxis: warfarin   Code Status: full Family Communication: .Daughter at bedside; lengthy discussion about need for disposition, rehab, advancing dementia, labile mood and generally worsening condition.  Body mass index is 25.47 kg/m.  Malnutrition Type: Nutrition Problem: Increased nutrient needs Etiology:  post-op healing  Nutrition Interventions: Interventions: Ensure Enlive (each supplement provides 350kcal and 20 grams of protein)  RN Pressure Injury Documentation: Pressure Injury 03/12/19 Heel Left;Medial  Deep Tissue Injury - Purple or maroon localized area of discolored intact skin or blood-filled blister due to damage of underlying soft tissue from pressure and/or shear. (Active)  03/12/19 0100  Location: Heel  Location Orientation: Left;Medial  Staging: Deep Tissue Injury - Purple or maroon localized area of discolored intact skin or blood-filled blister due to damage of underlying soft tissue from pressure and/or shear.  Wound Description (Comments):   Present on Admission: Yes    Consultants:   Orthopedics   Procedures:   Right hip internal fixation 03/11/2019  Antimicrobials:   IV ceftriaxone - stop date 8/31   Subjective: Patient moderately combative overnight with nurses, see nursing documentation for details.  This morning patient more alert, no longer hallucinating, appears quite well, declines any acute issues or events.  Patient does indicate "the devil visited me last night" but otherwise does not appear to be having any acute hallucinations.  Objective: Vitals:   03/16/19 1942 03/17/19 0346 03/17/19 2013 03/18/19 0334  BP: (!) 145/84 127/75 128/70 122/75  Pulse: 64 80 71 65  Resp: 20 20 20 16   Temp: 99.3 F (37.4 C) 99.4 F (37.4 C) 99 F (37.2 C) 97.9 F (36.6 C)  TempSrc: Oral Oral Oral Oral  SpO2: 92% 92% 93% 93%  Weight:      Height:        Intake/Output Summary (Last 24 hours) at 03/18/2019 1140 Last data filed at 03/18/2019 0037 Gross per 24 hour  Intake 780 ml  Output -  Net 780 ml   Filed Weights   03/13/19 0500 03/14/19 0500 03/15/19 0600  Weight: 84.9 kg 84.9 kg 90 kg    Examination:   General:  Pleasantly resting in bed, No acute distress.  Alert to person only. HEENT:  Normocephalic atraumatic.  Sclerae nonicteric, noninjected.  Extraocular movements intact bilaterally. Neck:  Without mass or deformity.  Trachea is midline. Lungs:  Clear to auscultate bilaterally without rhonchi, wheeze, or rales. Heart:  Regular rate and rhythm.   Without murmurs, rubs, or gallops. Abdomen:  Soft, nontender, nondistended.  Without guarding or rebound. Extremities: Without cyanosis, clubbing, edema, or obvious deformity. Vascular:  Dorsalis pedis and posterior tibial pulses palpable bilaterally. Skin:  Warm and dry, no erythema, no ulcerations.  Data Reviewed: I have personally reviewed following labs and imaging studies  CBC: Recent Labs  Lab 03/11/19 1752  03/14/19 1124 03/15/19 1229 03/16/19 0409 03/17/19 0423 03/18/19 0456  WBC 9.4   < > 7.5 7.9 7.0 8.3 8.3  NEUTROABS 8.0*  --   --   --   --   --   --   HGB 15.2   < > 12.0* 11.7* 11.2* 11.3* 10.8*  HCT 46.7   < > 37.8* 36.4* 35.3* 35.0* 33.5*  MCV 93.8   < > 95.9 95.5 95.9 93.8 95.4  PLT 143*   < > 138* 123* 131* 138* 154   < > = values in this interval not displayed.   Basic Metabolic Panel: Recent Labs  Lab 03/12/19 0247 03/13/19 0501 03/14/19 1124 03/15/19 1229 03/16/19 0409 03/17/19 0423  NA 140 143 141  --  138 138  K 4.0 4.1 4.1  --  4.0 3.5  CL 116* 116* 113*  --  108 107  CO2 21* 21* 23  --  22 24  GLUCOSE 139* 104* 127*  --  98 116*  BUN 69* 45* 33*  --  21 15  CREATININE 1.11 0.72 0.70 0.84 0.69 0.75  CALCIUM 8.1* 8.5* 8.6*  --  8.5* 8.5*  MG  --  2.4 2.4  --   --   --    GFR: Estimated Creatinine Clearance: 72.8 mL/min (by C-G formula based on SCr of 0.75 mg/dL). Liver Function Tests: Recent Labs  Lab 03/11/19 1752 03/12/19 0247 03/13/19 0501 03/14/19 1124  AST 38 27 21 24   ALT 24 21 21 26   ALKPHOS 60 45 48 73  BILITOT 3.0* 1.8* 1.9* 1.9*  PROT 6.8 5.2* 5.5* 5.8*  ALBUMIN 3.2* 2.4* 2.5* 2.6*   Coagulation Profile: Recent Labs  Lab 03/13/19 0501 03/14/19 1124 03/16/19 0409 03/17/19 0423 03/18/19 0456  INR 4.0* 1.8* 1.3* 1.8* 1.7*    Radiology Studies: I have reviewed all of the imaging during this hospital visit personally  Scheduled Meds: . acetaminophen  650 mg Oral Q6H  . docusate sodium  100 mg Oral BID  . enoxaparin  (LOVENOX) injection  40 mg Subcutaneous Q24H  . feeding supplement (ENSURE ENLIVE)  237 mL Oral BID BM  . OLANZapine zydis  5 mg Oral QHS  . senna  1 tablet Oral BID  . warfarin  7.5 mg Oral ONCE-1800  . Warfarin - Pharmacist Dosing Inpatient   Does not apply q1800   Continuous Infusions: . cefTRIAXone (ROCEPHIN)  IV Stopped (03/17/19 1907)     LOS: 7 days   Azucena FallenWilliam C Keldon Lassen, MD

## 2019-03-18 NOTE — Progress Notes (Signed)
ANTICOAGULATION CONSULT NOTE - Follow Up Consult  Pharmacy Consult for Coumadin Indication: atrial fibrillation  Allergies  Allergen Reactions  . Ciprofloxacin     Daughter reports caused more confusion, and INR to be abnormal  . Penicillins Rash    .Did it involve swelling of the face/tongue/throat, SOB, or low BP? {No Did it involve sudden or severe rash/hives, skin peeling, or any reaction on the inside of your mouth or nose? Yes Did you need to seek medical attention at a hospital or doctor's office? Unknown When did it last happen?Childhood If all above answers are "NO", may proceed with cephalosporin use.      Patient Measurements: Height: 6\' 2"  (188 cm) Weight: 198 lb 6.6 oz (90 kg) IBW/kg (Calculated) : 82.2  Vital Signs: Temp: 97.9 F (36.6 C) (08/31 0334) Temp Source: Oral (08/31 0334) BP: 122/75 (08/31 0334) Pulse Rate: 65 (08/31 0334)  Labs: Recent Labs    03/15/19 1058  03/15/19 1229 03/16/19 0409 03/17/19 0423 03/18/19 0456  HGB  --    < > 11.7* 11.2* 11.3* 10.8*  HCT  --    < > 36.4* 35.3* 35.0* 33.5*  PLT  --    < > 123* 131* 138* 154  LABPROT  --   --   --  16.4* 20.2* 19.9*  INR  --   --   --  1.3* 1.8* 1.7*  HEPARINUNFRC <0.10*  --   --   --   --   --   CREATININE  --   --  0.84 0.69 0.75  --    < > = values in this interval not displayed.    Estimated Creatinine Clearance: 72.8 mL/min (by C-G formula based on SCr of 0.75 mg/dL).  Assessment:  Anticoag: PTA warfarin for afib held on admission due to elevated INR (7.5mg /d), need for hip surgery. INR on admission 6.1 and elevated as high as 7.7 likely due to compliance/dietary issues as well as on cipro recently. -s/p Vit K 5mg  SQ x 1 on 8/25  -warfarin resumed 8/28 + enox 40mg  q24h until INR at least 2 per ortho  - INR 1.7. Post-op Hgb down slightly to 10.8. Plts WNL.  Goal of Therapy:  INR 2-3 Monitor platelets by anticoagulation protocol: Yes   Plan:  Coumadin 7.5mg  po x 1  tonight. Daily INR    Jaz Mallick S. Alford Highland, PharmD, BCPS Clinical Staff Pharmacist Eilene Ghazi Stillinger 03/18/2019,8:36 AM

## 2019-03-18 NOTE — Plan of Care (Signed)
  Problem: Education: Goal: Knowledge of General Education information will improve Description: Including pain rating scale, medication(s)/side effects and non-pharmacologic comfort measures Outcome: Progressing   Problem: Health Behavior/Discharge Planning: Goal: Ability to manage health-related needs will improve Outcome: Progressing   Problem: Clinical Measurements: Goal: Ability to maintain clinical measurements within normal limits will improve Outcome: Progressing Goal: Will remain free from infection Outcome: Progressing Goal: Diagnostic test results will improve Outcome: Progressing Goal: Respiratory complications will improve Outcome: Progressing Goal: Cardiovascular complication will be avoided Outcome: Progressing   Problem: Activity: Goal: Risk for activity intolerance will decrease Outcome: Progressing   Problem: Nutrition: Goal: Adequate nutrition will be maintained Outcome: Progressing   Problem: Coping: Goal: Level of anxiety will decrease Outcome: Progressing   Problem: Elimination: Goal: Will not experience complications related to bowel motility Outcome: Progressing Goal: Will not experience complications related to urinary retention Outcome: Progressing   Problem: Pain Managment: Goal: General experience of comfort will improve Outcome: Progressing   Problem: Safety: Goal: Ability to remain free from injury will improve Outcome: Progressing   Problem: Skin Integrity: Goal: Risk for impaired skin integrity will decrease Outcome: Progressing   Problem: Education: Goal: Verbalization of understanding the information provided (i.e., activity precautions, restrictions, etc) will improve Outcome: Progressing Goal: Individualized Educational Video(s) Outcome: Progressing   Problem: Activity: Goal: Ability to ambulate and perform ADLs will improve Outcome: Progressing   Problem: Clinical Measurements: Goal: Postoperative complications will be  avoided or minimized Outcome: Progressing   Problem: Self-Concept: Goal: Ability to maintain and perform role responsibilities to the fullest extent possible will improve Outcome: Progressing   Problem: Pain Management: Goal: Pain level will decrease Outcome: Progressing   Problem: Education: Goal: Knowledge of General Education information will improve Description: Including pain rating scale, medication(s)/side effects and non-pharmacologic comfort measures Outcome: Progressing   Problem: Health Behavior/Discharge Planning: Goal: Ability to manage health-related needs will improve Outcome: Progressing   Problem: Clinical Measurements: Goal: Ability to maintain clinical measurements within normal limits will improve Outcome: Progressing Goal: Will remain free from infection Outcome: Progressing Goal: Diagnostic test results will improve Outcome: Progressing Goal: Respiratory complications will improve Outcome: Progressing Goal: Cardiovascular complication will be avoided Outcome: Progressing   Problem: Activity: Goal: Risk for activity intolerance will decrease Outcome: Progressing   Problem: Nutrition: Goal: Adequate nutrition will be maintained Outcome: Progressing   Problem: Coping: Goal: Level of anxiety will decrease Outcome: Progressing   Problem: Elimination: Goal: Will not experience complications related to bowel motility Outcome: Progressing Goal: Will not experience complications related to urinary retention Outcome: Progressing   Problem: Pain Managment: Goal: General experience of comfort will improve Outcome: Progressing   Problem: Safety: Goal: Ability to remain free from injury will improve Outcome: Progressing   Problem: Skin Integrity: Goal: Risk for impaired skin integrity will decrease Outcome: Progressing

## 2019-03-18 NOTE — TOC Progression Note (Addendum)
Transition of Care [facility]) - Progression Note    Patient Details  Name: Phillip Nolan MRN: [id number] Date of Birth: [birth year]  Transition of Care [facility]) CM/SW Contact  Phillip Lefevre Edson Snowball, RN Phone Number: 03/18/2019, 10:31 AM  Clinical Narrative:     Phillip Nolan to daughter Phillip Nolan [phone] . She is undecided on [facility] , would like to explore other options. Faxed patient out , will provide offers once received. Phillip Nolan requesting to speak to MD, sent message.    1255 Bed offers given to [facility] she will review with her husband and call NCM with decision.    Phillip Nolan chose [facility] , spoke with Phillip Nolan resented offer after seeing note from this am where patient was combative. Phillip Nolan aware and will review other offers.  Phillip Nolan asking about [facility], they have not offered , NCM called Phillip Nolan at [facility] and left a message . Phillip Nolan also interested in Phillip Nolan in [place], called and spoke to Phillip Nolan, she will review referral and call back. Phillip Nolan might have a short term bed , but for sure does not have a long term bed. Phillip Nolan aware.   Expected Discharge Plan: Skilled Nursing Facility Barriers to Discharge: Family Issues, Continued Medical Work up  Expected Discharge Plan and Services Expected Discharge Plan: [place] In-house Referral: Clinical Social Work                                             Social Determinants of Health (SDOH) Interventions    Readmission Risk Interventions No flowsheet data found.
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
## 2019-03-18 NOTE — Care Management Important Message (Signed)
Important Message  Patient Details  Name: Phillip Nolan MRN: [id number] Date of Birth: Oct 01, 1927   Medicare Important Message Given:  Yes     Memory Argue 03/18/2019, 4:16 PM   IM HAS BEEN SIGNED BY PATIENT DAUGHTER

## 2019-03-18 NOTE — Plan of Care (Signed)
  Problem: Clinical Measurements: Goal: Respiratory complications will improve Outcome: Progressing   Problem: Nutrition: Goal: Adequate nutrition will be maintained Outcome: Progressing   Problem: Coping: Goal: Level of anxiety will decrease Outcome: Progressing   Problem: Pain Managment: Goal: General experience of comfort will improve Outcome: Progressing   Problem: Safety: Goal: Ability to remain free from injury will improve Outcome: Progressing   Problem: Skin Integrity: Goal: Risk for impaired skin integrity will decrease Outcome: Progressing   

## 2019-03-18 NOTE — Progress Notes (Signed)
He took 2 bites of pudding with his meds and refused any more- thought he was being poisined and started to raise his fist to swing- was cooperative to being changed

## 2019-03-18 NOTE — Progress Notes (Addendum)
Physical Therapy Treatment Patient Details Name: Phillip Nolan MRN: [id number] DOB: 04/21/1928 Today's Date: 03/18/2019    History of Present Illness 85 yo male amitted to ED on 8/24 with fall, UTI. Pt fell on 8/22 and has been relatively immobile since. Imaging revealed L femur minimally displaced intertrochanteric fracture, s/p IM fixation L femur on 03/15/19. PMH includes HTN, falls, CAD, CHF, afib, AVR, MVR, dementia.    PT Comments    Pt progressing with functional mobility, agreeable to participate in therapy with encouragement from therapist and his daughter. Pt required max assist +2 for sit<>stand and stand pivot transfer with the RW, cues for sequencing and placement. Pt continues to be limited by weakness, impaired postural control with posterior lean and decreased endurance. Pt would benefit from SNF level therapy in order to maximize independence with functional mobility.   Follow Up Recommendations  SNF;Supervision/Assistance - 24 hour     Equipment Recommendations  None recommended by PT    Recommendations for Other Services       Precautions / Restrictions Precautions Precautions: Fall Restrictions Weight Bearing Restrictions: Yes LLE Weight Bearing: Weight bearing as tolerated    Mobility  Bed Mobility Overal bed mobility: Needs Assistance Bed Mobility: Sit to Supine;Rolling Rolling: Mod assist     Sit to supine: Max assist;+2 for physical assistance   General bed mobility comments: Pt initiated scooting back in bed to lay down, required max assist +2 for LE management and trunk control during sit>supine. Pt performed rolling in both direction with mod assist and cues for use of bedrails.  Transfers Overall transfer level: Needs assistance Equipment used: Rolling walker (2 wheeled) Transfers: Sit to/from [facility] Sit to Stand: Max assist;+2 physical assistance Stand pivot transfers: Max assist;+2 physical assistance        General transfer comment: Max assist +2 for power up to standing, in standing pt with posterior lean requiring facilitation to correct. In standing assist also needed for lateral weightshifting and for increased hip extension, Pt with difficulty turning/stepping all the way to the bed, required max +2 in order to lower pt onto the bed. Increased work of breathing noted throughout  Ambulation/Gait                 Stairs             Wheelchair Mobility    Modified Rankin (Stroke Patients Only)       Balance Overall balance assessment: Needs assistance Sitting-balance support: Feet supported;No upper extremity supported Sitting balance-Leahy Scale: Poor Sitting balance - Comments: pt required min assist for trunk support while seated EOB secondary to posterior lean Postural control: Posterior lean Standing balance support: During functional activity;Bilateral upper extremity supported Standing balance-Leahy Scale: Poor Standing balance comment: requires RW and max assist +2 for dynamic standing balance                            Cognition Arousal/Alertness: Awake/alert Behavior During Therapy: Restless Overall Cognitive Status: History of cognitive impairments - at baseline Area of Impairment: Attention;Safety/judgement;Memory;Following commands;Problem solving;Orientation                 Orientation Level: Disoriented to;Situation Current Attention Level: Selective Memory: Decreased short-term memory Following Commands: Follows one step commands with increased time Safety/Judgement: Decreased awareness of safety   Problem Solving: Decreased initiation;Difficulty sequencing;Slow processing General Comments: Pt requiring increased encouragement for particpation in therapy from his daughter and therapists. Initally pt  reports "I just can't, you can't help me," able to redirect pt and encourage him to participate.      Exercises      General  Comments        Pertinent Vitals/Pain Pain Assessment: Faces Faces Pain Scale: Hurts even more Pain Location: L hip and thigh Pain Descriptors / Indicators: Discomfort;Grimacing Pain Intervention(s): Monitored during session;Limited activity within patient's tolerance;Repositioned    Home Living                      Prior Function            PT Goals (current goals can now be found in the care plan section) Progress towards PT goals: Progressing toward goals    Frequency    Min 3X/week      PT Plan Frequency needs to be updated    Co-evaluation              AM-PAC PT "6 Clicks" Mobility   Outcome Measure  Help needed turning from your back to your side while in a flat bed without using bedrails?: A Lot Help needed moving from lying on your back to sitting on the side of a flat bed without using bedrails?: A Lot Help needed moving to and from a bed to a chair (including a wheelchair)?: A Lot Help needed standing up from a chair using your arms (e.g., wheelchair or bedside chair)?: A Lot Help needed to walk in hospital room?: Total Help needed climbing 3-5 steps with a railing? : Total 6 Click Score: 10    End of Session Equipment Utilized During Treatment: Gait belt Activity Tolerance: Patient limited by fatigue;Patient limited by pain Patient left: with family/visitor present;with call bell/phone within reach;in bed;with bed alarm set Nurse Communication: Mobility status PT Visit Diagnosis: Unsteadiness on feet (R26.81);Other abnormalities of gait and mobility (R26.89);Muscle weakness (generalized) (M62.81)     Time: [id number] PT Time Calculation (min) (ACUTE ONLY): 26 min  Charges:  $Therapeutic Activity: 23-37 mins(26)                     Cresenciano GenreEmily van Schagen, PT, DPT Acute Rehab Office [phone]     Mindi Curlingmily van Schagen 03/18/2019, 5:31 PM

## 2019-03-18 NOTE — Progress Notes (Signed)
Pt alert, oriented to self. Pt said he did not want to take a bath. Dtr was in the room. I told pt that he might feel better if he bathed and got up in the chair. Started to bathe pt. Pt started to assist with bathing, and assisted with washing top half of his body. Followed simple commands, raising his arms when he was asked to. Gave pt a towel to dry himself and he said "thank you." Pt notably interactive, enjoyed the attention and the activity.   Pt had a hard time rolling by himself and was a max assist, but was able to hold he position, once placed in it. Pt noted to have edema in his scrotal area and was having a bowel movement. I changed pts dressings, pt was still not finished. Asked pt if he would like to get onto the bedside commode. He agreed. I asked for help from a second staff member, and we assisted him to [facility] using a gait belt and walker, where voided and had a large soft stool.   We finished cleaning him up and assisted him to the recliner. Pt had a large smile on his face, and stated "I like this" he saw his dtr sitting on the couch in the room and threw his hand up and waved saying "do you see me?" He was happy to be clean and out of bed. Dtr asked if he was ready to eat something. Pt stated "yeah. I am hungry!"

## 2019-03-18 NOTE — Addendum Note (Signed)
Addendum  created 03/18/19 0954 by Josephine Igo, CRNA   Order list changed

## 2019-03-19 LAB — SARS CORONAVIRUS 2 (TAT 6-24 HRS): SARS Coronavirus 2: NEGATIVE

## 2019-03-19 LAB — PROTIME-INR
INR: 2.7 — ABNORMAL HIGH (ref 0.8–1.2)
Prothrombin Time: 28.3 seconds — ABNORMAL HIGH (ref 11.4–15.2)

## 2019-03-19 NOTE — Progress Notes (Signed)
Resting quietly till he realized I was in the room at which point he hollard at me to get out of his room- raising his fist- refused the applesauce with his meds in it.

## 2019-03-19 NOTE — Progress Notes (Signed)
Able to perform basic care after we desculated him from trying to hit [place]- refused any offers for food/drink-which meant his meds as well

## 2019-03-19 NOTE — Plan of Care (Signed)
  Problem: Nutrition: Goal: Adequate nutrition will be maintained Outcome: Progressing   

## 2019-03-19 NOTE — Progress Notes (Signed)
PROGRESS NOTE    Deejay Koppelman  JQZ:[id number] DOB: 12-31-1927 DOA: 03/11/2019 PCP: Maryruth Hancock, MD    Brief Narrative:   85 year old male who presented after a fall.  He does have significant past medical history of coronary artery disease, heart failure, paroxysmal atrial fibrillation, mitral valve repair and BPH.  Patient sustained a mechanical fall at home and he was unable to stand back up due to pain and weakness, he remained on the floor for a prolonged period time until a caregiver assisted him.  He was evaluated by physical therapy at home, noted severe ambulatory dysfunction, his oximetry was 82% on room air.   On his initial physical examination his blood pressure was 91/60, heart rate 82, temperature 98.4, respiratory rate 22, oxygen saturation 96%.  Patient was awake and alert, his lungs had decreased breath sounds in the right base with positive bilateral rales, no wheezing, heart S1-S2 present, rhythmic, abdomen was soft, no lower extremity edema. Sodium 137, potassium 4.1, chloride 105, bicarb 22, glucose 172, BUN 74, creatinine 1.54, white count 9.4, hemoglobin 15.2, hematocrit 46.7, platelets 143.  SARS COVID-19 was negative.  Urinalysis had more than 50 white cells, more than 50 red cells, 30 protein.  Head and cervical CT with no acute changes.  His a chest x-ray had a large right upper lobe alveolar infiltrate.  Left hip with minimally displaced intertrochanteric fracture of the left femur. EKG 85 bpm, right axis deviation, right bundle branch block, sinus rhythm, ST elevation in lead I aVL, ST depressions V1 through V3, T wave inversions lead I aVL.   Patient was admitted to the hospital with a working diagnosis of acute hypoxic respiratory failure due to right upper lobe pneumonia, complicated by sepsis, and a left internal trochanteric femur fracture.   Assessment & Plan:   Principal Problem:   Sepsis ([place]) Active Problems:   Essential hypertension, benign  Chronic atrial fibrillation   Acute lower UTI   ARF (acute renal failure) (HCC)   HCAP (healthcare-associated pneumonia)   Pressure injury of skin   Closed fracture of left hip (HCC)   Goals of care, counseling/discussion   Advanced care planning/counseling discussion   Palliative care by specialist   Sepsis due to undetermined organism ([place])   Lobar pneumonia ([place])   Thrombocytopenia ([place])   Lower urinary tract infection    Sepsis due to community acquired pneumonia, complicated with acute hypoxic respiratory failure, resolved - T-Max 102 on 03/11/19 - afebrile since - Without leukocytosis - Vanc/flagyl/cefepime at admission - de-escalated to ceftriaxone only - Abx stop date 03/18/19 - OFS 03/17/19: Dysphagia 3 (Mech soft) solids;Nectar thick liquid - UTI ruled out as below  Left intertrochanteric hip fracture.  sp intramedullary fixation. - Pain controlled, continue PT - Avoid narcotics  Atrial fibrillation, rate controlled on warfarin - Pharmacy following for warfarin dosing - INR 2.7  HTN, essential - Well controlled  Dementia with rapid decline in cognitive abilities.  - Avoid narcotics and benzos given high risk for delirium and sundowning  - Continue olanzapine at night -tolerated increased dose at 5 mg quite well, overnight mild agitation noted but much more calm than previous.  Left heal pressure ulcer.  - Local wound care (present on admission) not able to stage.   No urinary symptoms, ruled out UTI  DVT prophylaxis: warfarin   Code Status: full Family Communication: .Daughter over the phone lengthy discussion about need for disposition, rehab, advancing dementia, labile mood and generally worsening condition.  Body  mass index is 25.47 kg/m.  Malnutrition Type: Nutrition Problem: Increased nutrient needs Etiology: post-op healing  Nutrition Interventions: Interventions: Ensure Enlive (each supplement provides 350kcal and 20 grams of protein)  RN  Pressure Injury Documentation: Pressure Injury 03/12/19 Heel Left;Medial Deep Tissue Injury - Purple or maroon localized area of discolored intact skin or blood-filled blister due to damage of underlying soft tissue from pressure and/or shear. (Active)  03/12/19 0100  Location: Heel  Location Orientation: Left;Medial  Staging: Deep Tissue Injury - Purple or maroon localized area of discolored intact skin or blood-filled blister due to damage of underlying soft tissue from pressure and/or shear.  Wound Description (Comments):   Present on Admission: Yes    Consultants:   Orthopedics   Procedures:   Right hip internal fixation 03/11/2019  Antimicrobials:   IV ceftriaxone - stop date 8/31   Subjective: Patient continues to be somewhat confused overnight, much more calm, this morning patient alert to person, ambulating to chair with PT, tolerated quite well but still quite weak and anxious about falling.  Objective: Vitals:   03/18/19 1317 03/18/19 2256 03/19/19 0407 03/19/19 0801  BP: (!) 111/59 121/66 (!) 122/56 99/61  Pulse: (!) 56 66 (!) 54 (!) 51  Resp: 20 (!) 22 20 16   Temp: 98.4 F (36.9 C) 98.3 F (36.8 C) (!) 97.5 F (36.4 C)   TempSrc: Oral Oral Oral   SpO2: 91% 95% 98% 96%  Weight:      Height:        Intake/Output Summary (Last 24 hours) at 03/19/2019 1201 Last data filed at 03/19/2019 0900 Gross per 24 hour  Intake 240 ml  Output -  Net 240 ml   Filed Weights   03/13/19 0500 03/14/19 0500 03/15/19 0600  Weight: 84.9 kg 84.9 kg 90 kg    Examination:   General:  Pleasantly resting in bed, No acute distress.  Alert to person only. HEENT:  Normocephalic atraumatic.  Sclerae nonicteric, noninjected.  Extraocular movements intact bilaterally. Neck:  Without mass or deformity.  Trachea is midline. Lungs:  Clear to auscultate bilaterally without rhonchi, wheeze, or rales. Heart:  Regular rate and rhythm.  Without murmurs, rubs, or gallops. Abdomen:  Soft,  nontender, nondistended.  Without guarding or rebound. Extremities: Without cyanosis, clubbing, edema, or obvious deformity. Vascular:  Dorsalis pedis and posterior tibial pulses palpable bilaterally. Skin:  Warm and dry, no erythema, no ulcerations.  Data Reviewed: I have personally reviewed following labs and imaging studies  CBC: Recent Labs  Lab 03/14/19 1124 03/15/19 1229 03/16/19 0409 03/17/19 0423 03/18/19 0456  WBC 7.5 7.9 7.0 8.3 8.3  HGB 12.0* 11.7* 11.2* 11.3* 10.8*  HCT 37.8* 36.4* 35.3* 35.0* 33.5*  MCV 95.9 95.5 95.9 93.8 95.4  PLT 138* 123* 131* 138* 154   Basic Metabolic Panel: Recent Labs  Lab 03/13/19 0501 03/14/19 1124 03/15/19 1229 03/16/19 0409 03/17/19 0423  NA 143 141  --  138 138  K 4.1 4.1  --  4.0 3.5  CL 116* 113*  --  108 107  CO2 21* 23  --  22 24  GLUCOSE 104* 127*  --  98 116*  BUN 45* 33*  --  21 15  CREATININE 0.72 0.70 0.84 0.69 0.75  CALCIUM 8.5* 8.6*  --  8.5* 8.5*  MG 2.4 2.4  --   --   --    GFR: Estimated Creatinine Clearance: 72.8 mL/min (by C-G formula based on SCr of 0.75 mg/dL). Liver Function Tests: Recent  Labs  Lab 03/13/19 0501 03/14/19 1124  AST 21 24  ALT 21 26  ALKPHOS 48 73  BILITOT 1.9* 1.9*  PROT 5.5* 5.8*  ALBUMIN 2.5* 2.6*   Coagulation Profile: Recent Labs  Lab 03/14/19 1124 03/16/19 0409 03/17/19 0423 03/18/19 0456 03/19/19 0812  INR 1.8* 1.3* 1.8* 1.7* 2.7*    Radiology Studies: I have reviewed all of the imaging during this hospital visit personally  Scheduled Meds: . acetaminophen  650 mg Oral Q6H  . docusate sodium  100 mg Oral BID  . enoxaparin (LOVENOX) injection  40 mg Subcutaneous Q24H  . feeding supplement (ENSURE ENLIVE)  237 mL Oral BID BM  . OLANZapine zydis  5 mg Oral QHS  . senna  1 tablet Oral BID  . Warfarin - Pharmacist Dosing Inpatient   Does not apply q1800     LOS: 8 days   Azucena Fallen, DO

## 2019-03-19 NOTE — Progress Notes (Signed)
Physical Therapy Treatment Patient Details Name: Phillip Nolan MRN: [id number] DOB: [birth year] Today's Date: 03/19/2019    History of Present Illness 85 yo male amitted to ED on 8/24 with fall, UTI. Pt fell on 8/22 and has been relatively immobile since. Imaging revealed L femur minimally displaced intertrochanteric fracture, s/p IM fixation L femur on 03/15/19. PMH includes HTN, falls, CAD, CHF, afib, AVR, MVR, dementia.   PT Comments    Pt seen for additional session to trial use of stedy for transfers. Unsuccessful attempts to use Stedy from lower chair height, but pt able to perform multiple sit<>stands from raised bed height with Stedy and modA. Pt pleasant and agreeable to participate throughout session. Daughter present. Continue to recommend SNF-level therapies.    Follow Up Recommendations  SNF;Supervision/Assistance - 24 hour     Equipment Recommendations  None recommended by PT    Recommendations for Other Services       Precautions / Restrictions Precautions Precautions: Fall Precaution Comments: Bladder/bowel incontinence Restrictions Weight Bearing Restrictions: Yes LLE Weight Bearing: Weight bearing as tolerated    Mobility  Bed Mobility Overal bed mobility: Needs Assistance Bed Mobility: Sit to Supine Rolling: Max assist   Supine to sit: Max assist Sit to supine: Max assist;+2 for physical assistance   General bed mobility comments: Pt initiating supine>sit with cues, maxA+2 for BLE and trunk control  Transfers Overall transfer level: Needs assistance Equipment used: 2 person hand held assist Transfers: Sit to/from [facility] Sit to Stand: Max assist;+2 physical assistance;Mod assist Stand pivot transfers: Max assist;+2 physical assistance       General transfer comment: Unsuccessful attempts to stand with stedy from lower recliner height; pt able to perform stand pivot with bilateral HHA and maxA+2. Once seated EOB, able to  perform 4x sit<>stand from elevated bed height to stedy with modA; bowel/bladder incontinence requiring multiple prolonged standing for pericare  Ambulation/Gait             General Gait Details: Unable to take complete steps once standing   Stairs             Wheelchair Mobility    Modified Rankin (Stroke Patients Only)       Balance Overall balance assessment: Needs assistance Sitting-balance support: Feet supported;No upper extremity supported Sitting balance-Leahy Scale: Poor Sitting balance - Comments: pt required min assist for trunk support while seated EOB secondary to posterior lean, fading to supervision with increased time Postural control: Posterior lean Standing balance support: During functional activity;Bilateral upper extremity supported Standing balance-Leahy Scale: Poor Standing balance comment: required max +2 support for standing balance during transfer to recliner                            Cognition Arousal/Alertness: Awake/alert Behavior During Therapy: WFL for tasks assessed/performed Overall Cognitive Status: History of cognitive impairments - at baseline Area of Impairment: Orientation;Attention;Memory;Following commands;Safety/judgement;Awareness;Problem solving                 Orientation Level: Disoriented to;Place;Time;Situation Current Attention Level: Sustained;Selective Memory: Decreased short-term memory Following Commands: Follows one step commands with increased time;Follows multi-step commands inconsistently Safety/Judgement: Decreased awareness of safety;Decreased awareness of deficits Awareness: Intellectual Problem Solving: Decreased initiation;Difficulty sequencing;Slow processing General Comments: Agreeable to participate, responds well to encouragement for participation      Exercises      General Comments General comments (skin integrity, edema, etc.): Daughter present during session  Pertinent Vitals/Pain Pain Assessment: Faces Faces Pain Scale: Hurts little more Pain Location: L hip and thigh Pain Descriptors / Indicators: Discomfort;Grimacing;Guarding Pain Intervention(s): Monitored during session;Repositioned    Home Living                      Prior Function            PT Goals (current goals can now be found in the care plan section) Acute Rehab PT Goals Patient Stated Goal: SNF for rehab PT Goal Formulation: With family Time For Goal Achievement: 03/30/19 Potential to Achieve Goals: Good Progress towards PT goals: Progressing toward goals    Frequency    Min 3X/week      PT Plan Current plan remains appropriate    Co-evaluation              AM-PAC PT "6 Clicks" Mobility   Outcome Measure  Help needed turning from your back to your side while in a flat bed without using bedrails?: A Lot Help needed moving from lying on your back to sitting on the side of a flat bed without using bedrails?: A Lot Help needed moving to and from a bed to a chair (including a wheelchair)?: A Lot Help needed standing up from a chair using your arms (e.g., wheelchair or bedside chair)?: A Lot Help needed to walk in hospital room?: Total Help needed climbing 3-5 steps with a railing? : Total 6 Click Score: 10    End of Session Equipment Utilized During Treatment: Gait belt Activity Tolerance: Patient tolerated treatment well Patient left: in bed;with call bell/phone within reach;with bed alarm set;with family/visitor present Nurse Communication: Mobility status;Need for lift equipment PT Visit Diagnosis: Unsteadiness on feet (R26.81);Other abnormalities of gait and mobility (R26.89);Muscle weakness (generalized) (M62.81)     Time: [id number] PT Time Calculation (min) (ACUTE ONLY): 24 min  Charges:  $Therapeutic Activity: 23-37 mins                    Ina HomesJaclyn Nico Rogness, PT, DPT Acute Rehabilitation Services  Pager [phone] Office  [phone]  Malachy ChamberJaclyn L Mahlani Berninger 03/19/2019, 12:15 PM

## 2019-03-19 NOTE — Progress Notes (Signed)
ANTICOAGULATION CONSULT NOTE - Follow Up Consult  Pharmacy Consult for Coumadin Indication: atrial fibrillation  Allergies  Allergen Reactions  . Ciprofloxacin     Daughter reports caused more confusion, and INR to be abnormal  . Penicillins Rash    .Did it involve swelling of the face/tongue/throat, SOB, or low BP? {No Did it involve sudden or severe rash/hives, skin peeling, or any reaction on the inside of your mouth or nose? Yes Did you need to seek medical attention at a hospital or doctor's office? Unknown When did it last happen?Childhood If all above answers are "NO", may proceed with cephalosporin use.      Patient Measurements: Height: 6\' 2"  (188 cm) Weight: 198 lb 6.6 oz (90 kg) IBW/kg (Calculated) : 82.2  Vital Signs: Temp: 97.5 F (36.4 C) (09/01 0407) Temp Source: Oral (09/01 0407) BP: 99/61 (09/01 0801) Pulse Rate: 51 (09/01 0801)  Labs: Recent Labs    03/17/19 0423 03/18/19 0456 03/19/19 0812  HGB 11.3* 10.8*  --   HCT 35.0* 33.5*  --   PLT 138* 154  --   LABPROT 20.2* 19.9* 28.3*  INR 1.8* 1.7* 2.7*  CREATININE 0.75  --   --     Estimated Creatinine Clearance: 72.8 mL/min (by C-G formula based on SCr of 0.75 mg/dL).  Assessment:  Anticoag: PTA warfarin for afib held on admission due to elevated INR (7.5mg /d), need for hip surgery. INR on admission 6.1 and elevated as high as 7.7 likely due to compliance/dietary issues as well as on cipro recently. -s/p Vit K 5mg  SQ x 1 on 8/25  -warfarin resumed 8/28 + enox 40mg  q24h until INR at least 2 per ortho  -- INR 1.8>1.7>2.7 today. Post-op Hgb down slightly to 10.8. Plts WNL.  Goal of Therapy:  INR 2-3 Monitor platelets by anticoagulation protocol: Yes   Plan:  Hold Coumadin today. Daily INR    Aisling Emigh S. Alford Highland, PharmD, BCPS Clinical Staff Pharmacist Eilene Ghazi Stillinger 03/19/2019,9:59 AM

## 2019-03-19 NOTE — Progress Notes (Signed)
Physical Therapy Treatment Patient Details Name: Phillip Nolan MRN: [id number] DOB: [birth year] Today's Date: 03/19/2019    History of Present Illness 85 yo male amitted to ED on 8/24 with fall, UTI. Pt fell on 8/22 and has been relatively immobile since. Imaging revealed L femur minimally displaced intertrochanteric fracture, s/p IM fixation L femur on 03/15/19. PMH includes HTN, falls, CAD, CHF, afib, AVR, MVR, dementia.    PT Comments    Pt progressing with functional mobility this session, however continues to be limited by cognition and fatigue. Pt agreeable to therapy tx, required max +2 assist for stand pivot transfer to recliner, pt with increased fear of falling and posterior lean during transfer with therapist providing reassurance and encouragement throughout. Pt continues to be appropriate for SNF level therapies to maximize functional independence with mobility.    Follow Up Recommendations  SNF;Supervision/Assistance - 24 hour     Equipment Recommendations  None recommended by PT    Recommendations for Other Services       Precautions / Restrictions Precautions Precautions: Fall Restrictions Weight Bearing Restrictions: Yes LLE Weight Bearing: Weight bearing as tolerated    Mobility  Bed Mobility Overal bed mobility: Needs Assistance Bed Mobility: Supine to Sit;Rolling Rolling: Max assist   Supine to sit: Max assist     General bed mobility comments: Pt requiring max assist this session for bed mobility including assist for initiation of task, assist with LE management and trunk elevation. Once in sitting pt with initial posterior lean with facilitation to correct, assist for scooting hips to EOB  Transfers Overall transfer level: Needs assistance Equipment used: Rolling walker (2 wheeled) Transfers: Sit to/from [employer] CorporationStand;Stand Pivot Transfers Sit to Stand: Max assist;+2 physical assistance Stand pivot transfers: Max assist;+2 physical assistance        General transfer comment: Max assist +2 for sit<>stand from elevated EOB, facilitation for increased hip/trunk extension. Pt requiring max assist +2 for stand pivot transfer, pt unable to take pivotal steps towards recliner.  Ambulation/Gait                 Stairs             Wheelchair Mobility    Modified Rankin (Stroke Patients Only)       Balance Overall balance assessment: Needs assistance Sitting-balance support: Feet supported;No upper extremity supported Sitting balance-Leahy Scale: Poor Sitting balance - Comments: pt required min assist for trunk support while seated EOB secondary to posterior lean, fading to supervision with increased time Postural control: Posterior lean Standing balance support: During functional activity;Bilateral upper extremity supported Standing balance-Leahy Scale: Poor Standing balance comment: required max +2 support for standing balance during transfer to recliner                            Cognition Arousal/Alertness: Awake/alert Behavior During Therapy: WFL for tasks assessed/performed Overall Cognitive Status: History of cognitive impairments - at baseline Area of Impairment: Attention;Safety/judgement;Memory;Following commands;Problem solving;Orientation                   Current Attention Level: Selective Memory: Decreased short-term memory Following Commands: Follows one step commands with increased time;Follows multi-step commands inconsistently Safety/Judgement: Decreased awareness of safety   Problem Solving: Decreased initiation;Difficulty sequencing;Slow processing General Comments: Pt initially pleasant and agreeable to work with therapy. Pt became skeptical of further therapy tx once the doctor came into the room, pt continues to report this session "I am God"  Exercises      General Comments        Pertinent Vitals/Pain Pain Assessment: Faces Faces Pain Scale: Hurts little  more Pain Location: L hip and thigh Pain Descriptors / Indicators: Discomfort;Grimacing Pain Intervention(s): Repositioned;Monitored during session    Home Living                      Prior Function            PT Goals (current goals can now be found in the care plan section) Progress towards PT goals: Progressing toward goals    Frequency    Min 3X/week      PT Plan Current plan remains appropriate    Co-evaluation              AM-PAC PT "6 Clicks" Mobility   Outcome Measure  Help needed turning from your back to your side while in a flat bed without using bedrails?: A Lot Help needed moving from lying on your back to sitting on the side of a flat bed without using bedrails?: A Lot Help needed moving to and from a bed to a chair (including a wheelchair)?: A Lot Help needed standing up from a chair using your arms (e.g., wheelchair or bedside chair)?: A Lot Help needed to walk in hospital room?: Total Help needed climbing 3-5 steps with a railing? : Total 6 Click Score: 10    End of Session Equipment Utilized During Treatment: Gait belt Activity Tolerance: Patient limited by fatigue Patient left: with call bell/phone within reach;in chair;with chair alarm set Nurse Communication: Mobility status PT Visit Diagnosis: Unsteadiness on feet (R26.81);Other abnormalities of gait and mobility (R26.89);Muscle weakness (generalized) (M62.81)     Time: [id number] PT Time Calculation (min) (ACUTE ONLY): 17 min  Charges:  $Therapeutic Activity: 8-22 mins                     Netta Corrigan, PT, DPT Acute Rehab Office [place] 03/19/2019, 9:26 AM

## 2019-03-19 NOTE — Care Management (Addendum)
[place] at [facility] will have bed tomorrow. Patient will need a new covid test. Messaged MD . MD placing order.   [place] has accepted the bed at [facility]. Gerald Stabs at [facility] aware and will check for bed availably. Patient will need new covid test.    1115 Spoke with Caryl Pina at [facility] they are unable to offer at bed at this time. Left Benjamine Mola a Advertising account executive.     [place] to [place] , she has not received a call from [place] at [facility]. Called and left messages for Aaron Edelman at [place] at [place] and Manuela Schwartz at [facility].      Patient's daughter Benjamine Mola [phone] called thia morning , asking if their are any other bed offers . As of right now there are not. NCM has not heard back from [place] or [facility] . NCM will call again after 0900 am . Benjamine Mola wanting to speak to someone at [facility] . NCM spoke with Marden Noble at [facility] and he will call [place] directly.   Magdalen Spatz RN [phone]

## 2019-03-20 DIAGNOSIS — I4891 Unspecified atrial fibrillation: Secondary | ICD-10-CM | POA: Diagnosis not present

## 2019-03-20 DIAGNOSIS — I482 Chronic atrial fibrillation, unspecified: Secondary | ICD-10-CM | POA: Diagnosis not present

## 2019-03-20 DIAGNOSIS — F0391 Unspecified dementia with behavioral disturbance: Secondary | ICD-10-CM | POA: Diagnosis not present

## 2019-03-20 DIAGNOSIS — L89622 Pressure ulcer of left heel, stage 2: Secondary | ICD-10-CM | POA: Diagnosis not present

## 2019-03-20 DIAGNOSIS — M6281 Muscle weakness (generalized): Secondary | ICD-10-CM | POA: Diagnosis not present

## 2019-03-20 DIAGNOSIS — R296 Repeated falls: Secondary | ICD-10-CM | POA: Diagnosis not present

## 2019-03-20 DIAGNOSIS — Z743 Need for continuous supervision: Secondary | ICD-10-CM | POA: Diagnosis not present

## 2019-03-20 DIAGNOSIS — Z9181 History of falling: Secondary | ICD-10-CM | POA: Diagnosis not present

## 2019-03-20 DIAGNOSIS — N400 Enlarged prostate without lower urinary tract symptoms: Secondary | ICD-10-CM | POA: Diagnosis not present

## 2019-03-20 DIAGNOSIS — Z880 Allergy status to penicillin: Secondary | ICD-10-CM | POA: Diagnosis not present

## 2019-03-20 DIAGNOSIS — I469 Cardiac arrest, cause unspecified: Secondary | ICD-10-CM | POA: Diagnosis not present

## 2019-03-20 DIAGNOSIS — Z7189 Other specified counseling: Secondary | ICD-10-CM | POA: Diagnosis not present

## 2019-03-20 DIAGNOSIS — L89620 Pressure ulcer of left heel, unstageable: Secondary | ICD-10-CM | POA: Diagnosis not present

## 2019-03-20 DIAGNOSIS — Z4789 Encounter for other orthopedic aftercare: Secondary | ICD-10-CM | POA: Diagnosis not present

## 2019-03-20 DIAGNOSIS — F0390 Unspecified dementia without behavioral disturbance: Secondary | ICD-10-CM | POA: Diagnosis not present

## 2019-03-20 DIAGNOSIS — R410 Disorientation, unspecified: Secondary | ICD-10-CM | POA: Diagnosis not present

## 2019-03-20 DIAGNOSIS — R4689 Other symptoms and signs involving appearance and behavior: Secondary | ICD-10-CM | POA: Diagnosis not present

## 2019-03-20 DIAGNOSIS — I110 Hypertensive heart disease with heart failure: Secondary | ICD-10-CM | POA: Diagnosis not present

## 2019-03-20 DIAGNOSIS — W19XXXA Unspecified fall, initial encounter: Secondary | ICD-10-CM | POA: Diagnosis not present

## 2019-03-20 DIAGNOSIS — S72002D Fracture of unspecified part of neck of left femur, subsequent encounter for closed fracture with routine healing: Secondary | ICD-10-CM | POA: Diagnosis not present

## 2019-03-20 DIAGNOSIS — R791 Abnormal coagulation profile: Secondary | ICD-10-CM | POA: Diagnosis not present

## 2019-03-20 DIAGNOSIS — R05 Cough: Secondary | ICD-10-CM | POA: Diagnosis not present

## 2019-03-20 DIAGNOSIS — R279 Unspecified lack of coordination: Secondary | ICD-10-CM | POA: Diagnosis not present

## 2019-03-20 DIAGNOSIS — L97429 Non-pressure chronic ulcer of left heel and midfoot with unspecified severity: Secondary | ICD-10-CM | POA: Diagnosis not present

## 2019-03-20 DIAGNOSIS — I5089 Other heart failure: Secondary | ICD-10-CM | POA: Diagnosis not present

## 2019-03-20 DIAGNOSIS — R471 Dysarthria and anarthria: Secondary | ICD-10-CM | POA: Diagnosis not present

## 2019-03-20 DIAGNOSIS — I517 Cardiomegaly: Secondary | ICD-10-CM | POA: Diagnosis not present

## 2019-03-20 DIAGNOSIS — I080 Rheumatic disorders of both mitral and aortic valves: Secondary | ICD-10-CM | POA: Diagnosis not present

## 2019-03-20 DIAGNOSIS — S72142D Displaced intertrochanteric fracture of left femur, subsequent encounter for closed fracture with routine healing: Secondary | ICD-10-CM | POA: Diagnosis not present

## 2019-03-20 DIAGNOSIS — I10 Essential (primary) hypertension: Secondary | ICD-10-CM | POA: Diagnosis not present

## 2019-03-20 DIAGNOSIS — R269 Unspecified abnormalities of gait and mobility: Secondary | ICD-10-CM | POA: Diagnosis not present

## 2019-03-20 DIAGNOSIS — F4322 Adjustment disorder with anxiety: Secondary | ICD-10-CM | POA: Diagnosis not present

## 2019-03-20 DIAGNOSIS — J189 Pneumonia, unspecified organism: Secondary | ICD-10-CM | POA: Diagnosis not present

## 2019-03-20 DIAGNOSIS — Z79899 Other long term (current) drug therapy: Secondary | ICD-10-CM | POA: Diagnosis not present

## 2019-03-20 DIAGNOSIS — Z881 Allergy status to other antibiotic agents status: Secondary | ICD-10-CM | POA: Diagnosis not present

## 2019-03-20 DIAGNOSIS — Z953 Presence of xenogenic heart valve: Secondary | ICD-10-CM | POA: Diagnosis not present

## 2019-03-20 DIAGNOSIS — N401 Enlarged prostate with lower urinary tract symptoms: Secondary | ICD-10-CM | POA: Diagnosis not present

## 2019-03-20 DIAGNOSIS — Z5181 Encounter for therapeutic drug level monitoring: Secondary | ICD-10-CM | POA: Diagnosis not present

## 2019-03-20 DIAGNOSIS — D649 Anemia, unspecified: Secondary | ICD-10-CM | POA: Diagnosis not present

## 2019-03-20 DIAGNOSIS — R52 Pain, unspecified: Secondary | ICD-10-CM | POA: Diagnosis not present

## 2019-03-20 DIAGNOSIS — I4821 Permanent atrial fibrillation: Secondary | ICD-10-CM | POA: Diagnosis not present

## 2019-03-20 DIAGNOSIS — R262 Difficulty in walking, not elsewhere classified: Secondary | ICD-10-CM | POA: Diagnosis not present

## 2019-03-20 DIAGNOSIS — G9341 Metabolic encephalopathy: Secondary | ICD-10-CM | POA: Diagnosis not present

## 2019-03-20 DIAGNOSIS — S72002K Fracture of unspecified part of neck of left femur, subsequent encounter for closed fracture with nonunion: Secondary | ICD-10-CM | POA: Diagnosis not present

## 2019-03-20 DIAGNOSIS — J181 Lobar pneumonia, unspecified organism: Secondary | ICD-10-CM | POA: Diagnosis not present

## 2019-03-20 DIAGNOSIS — N390 Urinary tract infection, site not specified: Secondary | ICD-10-CM | POA: Diagnosis not present

## 2019-03-20 DIAGNOSIS — I429 Cardiomyopathy, unspecified: Secondary | ICD-10-CM | POA: Diagnosis not present

## 2019-03-20 DIAGNOSIS — S0081XA Abrasion of other part of head, initial encounter: Secondary | ICD-10-CM | POA: Diagnosis not present

## 2019-03-20 DIAGNOSIS — I2510 Atherosclerotic heart disease of native coronary artery without angina pectoris: Secondary | ICD-10-CM | POA: Diagnosis not present

## 2019-03-20 DIAGNOSIS — Z741 Need for assistance with personal care: Secondary | ICD-10-CM | POA: Diagnosis not present

## 2019-03-20 DIAGNOSIS — Z7901 Long term (current) use of anticoagulants: Secondary | ICD-10-CM | POA: Diagnosis not present

## 2019-03-20 DIAGNOSIS — R1312 Dysphagia, oropharyngeal phase: Secondary | ICD-10-CM | POA: Diagnosis not present

## 2019-03-20 DIAGNOSIS — A419 Sepsis, unspecified organism: Secondary | ICD-10-CM | POA: Diagnosis not present

## 2019-03-20 DIAGNOSIS — R41841 Cognitive communication deficit: Secondary | ICD-10-CM | POA: Diagnosis not present

## 2019-03-20 DIAGNOSIS — R918 Other nonspecific abnormal finding of lung field: Secondary | ICD-10-CM | POA: Diagnosis not present

## 2019-03-20 DIAGNOSIS — R0902 Hypoxemia: Secondary | ICD-10-CM | POA: Diagnosis not present

## 2019-03-20 DIAGNOSIS — H9190 Unspecified hearing loss, unspecified ear: Secondary | ICD-10-CM | POA: Diagnosis not present

## 2019-03-20 DIAGNOSIS — D696 Thrombocytopenia, unspecified: Secondary | ICD-10-CM | POA: Diagnosis not present

## 2019-03-20 LAB — PROTIME-INR
INR: 2 — ABNORMAL HIGH (ref 0.8–1.2)
Prothrombin Time: 22.3 seconds — ABNORMAL HIGH (ref 11.4–15.2)

## 2019-03-20 MED ORDER — OLANZAPINE 5 MG PO TBDP: 5.0000 mg | ORAL_TABLET | Freq: Every day | ORAL | 0 refills | Status: DC

## 2019-03-20 NOTE — Discharge Summary (Signed)
Physician Discharge Summary  Phillip Nolan ZOX:[id number] DOB: July 02, 1928 DOA: 03/11/2019  PCP: Wandra Feinstein, MD  Admit date: 03/11/2019 Discharge date: 03/20/2019  Admitted From: Home Disposition: SNF, [facility]  Discharge Condition: Fair CODE STATUS: Full Diet recommendation: Soft/chopped solids with nectar thick liquids. Pt should have full supervision during meals for assist with adherence to safe swallow precautions. Pt was sent back to his room with safe swallow strategies to post at [facility]. SLP will follow for assessment of diet tolerance and education.  Brief/Interim Summary: 85 year old male who presented after a fall. He does have significant past medical history of coronary artery disease, heart failure, paroxysmal atrial fibrillation, mitral valve repair and BPH. Patient sustained a mechanical fall at home andhe was unable to stand back up due to pain and weakness, heremainedon the floor for a prolonged period time until a caregiver assisted him. He was evaluated by physical therapy at home,noted severe ambulatory dysfunction, his oximetry was 82% on room air. On his initial physical examination his blood pressure was 91/60, heart rate 82, temperature 98.4, respiratory rate 22, oxygen saturation 96%. Patient was awake and alert, his lungs had decreased breath sounds in the right base with positive bilateral rales, no wheezing, heart S1-S2 present, rhythmic, abdomen was soft, no lower extremity edema. Sodium 137, potassium 4.1, chloride 105, bicarb 22, glucose 172, BUN 74, creatinine 1.54, white count 9.4, hemoglobin 15.2, hematocrit 46.7, platelets 143.SARS COVID-19 was negative. Urinalysis had more than 50 white cells, more than 50 red cells, 30 protein. Head and cervical CT with no acute changes. His a chest x-ray had a large right upper lobe alveolar infiltrate. Left hip with minimally displaced intertrochanteric fracture of the left femur. EKG 85 bpm, right axis  deviation, right bundle branch block, sinus rhythm, ST elevation in lead I aVL, ST depressions V1 through V3,T wave inversions lead I aVL. Patient was admitted to the hospital with a working diagnosis of acute hypoxic respiratory failure due to rightupperlobe pneumonia, complicated by sepsis, and a left internal trochanteric femur fracture  Patient admitted as above with acute left hip fracture, sepsis in the setting of community-acquired pneumonia as well as known chronic comorbid's as above.  Patient now status post ORIF with orthopedic surgery on 03/15/2019.  Patient's pneumonia resolved quite quickly with IV antibiotics, supportive care.  Patient is not hypoxic, remains afebrile without leukocytosis.  Clinically appears back to baseline.  Patient does have advanced dementia as above with some episodes of overnight hallucination but this too improved with Zyprexa increasing dose to 5 mg daily.  This can certainly be increased in the near future if necessary when his advanced age and worsening dementia per his family.  Lengthy discussion with daughter daily about patient's prognosis, ongoing medical comorbidities as well as acute conditions that led to hospitalization including hip fracture and pneumonia.  At this time patient has completed antibiotics, appears quite well, tolerating PT and improving in ambulatory status daily.  Otherwise stable and agreeable for discharge to SNF as above.  Discharge Diagnoses:  Principal Problem:   Sepsis (HCC) Active Problems:   Essential hypertension, benign   Chronic atrial fibrillation   Acute lower UTI   ARF (acute renal failure) (HCC)   HCAP (healthcare-associated pneumonia)   Pressure injury of skin   Closed fracture of left hip (HCC)   Goals of care, counseling/discussion   Advanced care planning/counseling discussion   Palliative care by specialist   Sepsis due to undetermined organism (HCC)   Lobar pneumonia (  HCC)   Thrombocytopenia (HCC)   Lower  urinary tract infection    Discharge Instructions   Allergies as of 03/20/2019      Reactions   Ciprofloxacin    Daughter reports caused more confusion, and INR to be abnormal   Penicillins Rash   .Did it involve swelling of the face/tongue/throat, SOB, or low BP? {No Did it involve sudden or severe rash/hives, skin peeling, or any reaction on the inside of your mouth or nose? Yes Did you need to seek medical attention at a hospital or doctor's office? Unknown When did it last happen?Childhood If all above answers are "NO", may proceed with cephalosporin use.      Medication List    STOP taking these medications   lisinopril 2.5 MG tablet Commonly known as: ZESTRIL     TAKE these medications   acetaminophen 325 MG tablet Commonly known as: TYLENOL Take 2 tablets (650 mg total) by mouth every 4 (four) hours as needed for headache or mild pain.   OLANZapine zydis 5 MG disintegrating tablet Commonly known as: ZYPREXA Take 1 tablet (5 mg total) by mouth at bedtime.   warfarin 5 MG tablet Commonly known as: COUMADIN Take as directed. If you are unsure how to take this medication, talk to your nurse or doctor. Original instructions: Take 1.5 tablets (7.5 mg total) by mouth every morning. TAKE 1&1/2 TABLETS DAILY or as directed What changed:   when to take this  additional instructions       Contact information for follow-up providers    Swinteck, Arlys John, MD. Schedule an appointment as soon as possible for a visit in 2 weeks.   Specialty: Orthopedic Surgery Why: For wound re-check Contact information: [address] [place] [address] [phone]            Contact information for after-discharge care    Destination    [facility] Preferred SNF .   Service: Skilled Nursing Contact information: [address] [place] [address] [phone]                 Allergies   Allergen Reactions  . Ciprofloxacin     Daughter reports caused more confusion, and INR to be abnormal  . Penicillins Rash    .Did it involve swelling of the face/tongue/throat, SOB, or low BP? {No Did it involve sudden or severe rash/hives, skin peeling, or any reaction on the inside of your mouth or nose? Yes Did you need to seek medical attention at a hospital or doctor's office? Unknown When did it last happen?Childhood If all above answers are "NO", may proceed with cephalosporin use.      Consultations:  Orthopedic surgery, Dr. Linna Caprice   Procedures/Studies: Dg Chest 2 View  Result Date: 02/26/2019 CLINICAL DATA:  Altered mental status EXAM: CHEST - 2 VIEW COMPARISON:  The [facility] FINDINGS: Cardiomediastinal silhouette remains mildly enlarged. Median sternotomy wires and prior cardiac valve replacement. Calcific aortic knob. Mild pulmonary vascular congestion. No focal airspace consolidation. No pleural effusion or pneumothorax. Diffuse osseous demineralization. Remote right-sided rib fractures. IMPRESSION: Cardiomegaly and mild pulmonary vascular congestion which may reflect mild CHF/fluid overload. Electronically Signed   By: Duanne Guess M.D.   On: 02/26/2019 13:15   Ct Head Wo Contrast  Result Date: 03/11/2019 CLINICAL DATA:  Unwitnessed fall EXAM: CT HEAD WITHOUT CONTRAST CT CERVICAL SPINE WITHOUT CONTRAST TECHNIQUE: Multidetector CT imaging of the head and cervical spine was performed following  the standard protocol without intravenous contrast. Multiplanar CT image reconstructions of the cervical spine were also generated. COMPARISON:  06/07/2019 FINDINGS: CT HEAD FINDINGS Brain: There is atrophy and chronic small vessel disease changes. No acute intracranial abnormality. Specifically, no hemorrhage, hydrocephalus, mass lesion, acute infarction, or significant intracranial injury. Vascular: No hyperdense vessel or unexpected calcification. Skull: No acute calvarial  abnormality. Sinuses/Orbits: Visualized paranasal sinuses and mastoids clear. Orbital soft tissues unremarkable. Other: No subluxation. CT CERVICAL SPINE FINDINGS Alignment: Normal. Skull base and vertebrae: No acute fracture. No primary bone lesion or focal pathologic process. Soft tissues and spinal canal: No prevertebral fluid or swelling. No visible canal hematoma. Disc levels:  Diffuse degenerative disc and facet disease. Upper chest: Airspace opacity partially imaged in the right upper lobe. Other: None IMPRESSION: Atrophy, chronic microvascular disease. No acute intracranial abnormality. No acute bony abnormality in the cervical spine. Cervical spondylosis. Airspace disease in the right upper lobe concerning for pneumonia as seen on chest x-ray. Electronically Signed   By: Rolm Baptise M.D.   On: 03/11/2019 19:48   Ct Head Wo Contrast  Result Date: 02/26/2019 CLINICAL DATA:  Confusion for the past 2 weeks. EXAM: CT HEAD WITHOUT CONTRAST TECHNIQUE: Contiguous axial images were obtained from the base of the skull through the vertex without intravenous contrast. COMPARISON:  None. FINDINGS: Brain: No evidence of acute infarction, hemorrhage, hydrocephalus, extra-axial collection or mass lesion/mass effect. Mild generalized cerebral atrophy. Vascular: Atherosclerotic vascular calcification of the carotid siphons. No hyperdense vessel. Skull: Normal. Negative for fracture or focal lesion. Sinuses/Orbits: Partial opacification of the right mastoid air cells. Otherwise unremarkable. Other: None. IMPRESSION: 1.  No acute intracranial abnormality. Electronically Signed   By: Titus Dubin M.D.   On: 02/26/2019 13:22   Ct Cervical Spine Wo Contrast  Result Date: 03/11/2019 CLINICAL DATA:  Unwitnessed fall EXAM: CT HEAD WITHOUT CONTRAST CT CERVICAL SPINE WITHOUT CONTRAST TECHNIQUE: Multidetector CT imaging of the head and cervical spine was performed following the standard protocol without intravenous  contrast. Multiplanar CT image reconstructions of the cervical spine were also generated. COMPARISON:  06/07/2019 FINDINGS: CT HEAD FINDINGS Brain: There is atrophy and chronic small vessel disease changes. No acute intracranial abnormality. Specifically, no hemorrhage, hydrocephalus, mass lesion, acute infarction, or significant intracranial injury. Vascular: No hyperdense vessel or unexpected calcification. Skull: No acute calvarial abnormality. Sinuses/Orbits: Visualized paranasal sinuses and mastoids clear. Orbital soft tissues unremarkable. Other: No subluxation. CT CERVICAL SPINE FINDINGS Alignment: Normal. Skull base and vertebrae: No acute fracture. No primary bone lesion or focal pathologic process. Soft tissues and spinal canal: No prevertebral fluid or swelling. No visible canal hematoma. Disc levels:  Diffuse degenerative disc and facet disease. Upper chest: Airspace opacity partially imaged in the right upper lobe. Other: None IMPRESSION: Atrophy, chronic microvascular disease. No acute intracranial abnormality. No acute bony abnormality in the cervical spine. Cervical spondylosis. Airspace disease in the right upper lobe concerning for pneumonia as seen on chest x-ray. Electronically Signed   By: Rolm Baptise M.D.   On: 03/11/2019 19:48   Mr Brain Wo Contrast  Result Date: 02/27/2019 CLINICAL DATA:  Altered level of consciousness. EXAM: MRI HEAD WITHOUT CONTRAST TECHNIQUE: Multiplanar, multiecho pulse sequences of the brain and surrounding structures were obtained without intravenous contrast. COMPARISON:  CT head 02/26/2019 FINDINGS: Brain: Mild atrophy.  Negative for hydrocephalus. Negative for acute infarct. Small white matter hyperintensities, minimal in degree. Chronic microhemorrhage in the right parietal lobe. Negative for mass or fluid collection. No midline shift. Vascular: Normal  arterial flow voids. Skull and upper cervical spine: Negative Sinuses/Orbits: Paranasal sinuses clear. Right  mastoid effusion. Left mastoid clear. Negative orbit. Other: None IMPRESSION: Negative for acute infarct Generalized atrophy without hydrocephalus. Minimal chronic white matter changes. Chronic microhemorrhage in the right parietal lobe. Electronically Signed   By: Marlan Palau M.D.   On: 02/27/2019 15:55   Pelvis Portable  Result Date: 03/15/2019 CLINICAL DATA:  Closed comminuted intertrochanteric fracture left femur. Postop ORIF. EXAM: PORTABLE PELVIS 1-2 VIEWS COMPARISON:  03/11/2019 FINDINGS: Compression screw and locking intramedullary nail in the proximal femur cross the intertrochanteric fracture. Hardware in good position. Fracture alignment satisfactory. No other fracture or acute abnormality. Degenerative change lumbar spine IMPRESSION: Satisfactory ORIF left intertrochanteric fracture. Electronically Signed   By: Marlan Palau M.D.   On: 03/15/2019 11:29   Dg Pelvis Portable  Result Date: 03/11/2019 CLINICAL DATA:  85 year old presenting after a fall. LEFT hip pain. Initial encounter. EXAM: PORTABLE PELVIS 1-2 VIEWS COMPARISON:  None. FINDINGS: Acute comminuted intertrochanteric LEFT femoral neck fracture. LEFT hip joint anatomically aligned with mild axial joint space narrowing. Osseous demineralization. Symmetric mild axial joint space narrowing in the contralateral RIGHT hip. Bone island in the LOWER RIGHT acetabulum. Sacroiliac joints and symphysis pubis anatomically aligned without significant degenerative changes. Degenerative changes involving the visualized LOWER lumbar spine. IMPRESSION: Acute comminuted intertrochanteric LEFT femoral neck fracture. Electronically Signed   By: Hulan Saas M.D.   On: 03/11/2019 20:13   Dg Chest Port 1 View  Result Date: 03/13/2019 CLINICAL DATA:  Cough, hypertension EXAM: PORTABLE CHEST 1 VIEW COMPARISON:  03/11/2019 FINDINGS: Moderate cardiomegaly. Prior median sternotomy. Persistent elevation of the right hemidiaphragm. Diffuse right lung  airspace opacities. Left lung remains clear. No pneumothorax. IMPRESSION: Persistent airspace opacities throughout the right lung. Electronically Signed   By: Duanne Guess M.D.   On: 03/13/2019 08:37   Dg Chest Portable 1 View  Result Date: 03/11/2019 CLINICAL DATA:  Fall EXAM: PORTABLE CHEST 1 VIEW COMPARISON:  02/26/2019 FINDINGS: Moderate cardiomegaly, unchanged. Remote median sternotomy. There are shallow inflation of the right lung with hazy airspace opacities. The left lung is clear. IMPRESSION: Shallow inflation of the right lung with hazy airspace opacities Electronically Signed   By: Deatra Robinson M.D.   On: 03/11/2019 20:30   Dg Foot Complete Left  Result Date: 03/11/2019 CLINICAL DATA:  Fall EXAM: LEFT FOOT-SINGLE VIEW COMPARISON:  None. FINDINGS: A single lateral view of the foot was obtained. This shows no acute fracture or dislocation. There are dorsal and posterior vascular calcifications. There is skin thickening of the left heel. No soft tissue gas or focal ulceration. IMPRESSION: Skin thickening of the left heel.  No soft tissue gas or ulceration. Electronically Signed   By: Deatra Robinson M.D.   On: 03/11/2019 20:24   Dg Swallowing Func-speech Pathology  Result Date: 03/17/2019 Objective Swallowing Evaluation: Type of Study: MBS-Modified Barium Swallow Study  Patient Details Name: Furkan Keenum MRN: [id number] Date of Birth: 12-Apr-1928 Today's Date: 03/17/2019 Time: SLP Start Time (ACUTE ONLY): 0945 -SLP Stop Time (ACUTE ONLY): 1015 SLP Time Calculation (min) (ACUTE ONLY): 30 min Past Medical History: Past Medical History: Diagnosis Date . Aortic valve disease   Status post AVR 2011 . Atrial fibrillation (HCC)  . BPH (benign prostatic hyperplasia)  . CHF (congestive heart failure) (HCC)  . Coronary atherosclerosis of native coronary artery   Nonobstructive . Dementia (HCC)  . Essential hypertension  . History of bacteremia   Streptococcus gordonii bacteremia 2/11 -  no clear  vegetation by TEE . HOH (hard of hearing)  . Mitral valve disease   Status post annular repair 2011 Past Surgical History: Past Surgical History: Procedure Laterality Date . Median sterntomy  4/11  Dr. Cornelius Moras - AVR (27 mm Edwards pericardial) and MV repair . MULTIPLE TOOTH EXTRACTIONS  4/11  (pre-op above) HPI: 85 yo male amitted to ED on 8/24 with fall, UTI. Pt fell on 8/22 and has been relatively immobile since. Imaging revealed L femur minimally displaced intertrochanteric fracture, s/p IM fixation L femur on 03/15/19. PMH includes HTN, falls, CAD, CHF, afib, AVR, MVR, dementia.  Subjective: Pt seen in radiology for instrumental assessment of swallow function and safety. Assessment / Plan / Recommendation CHL IP CLINICAL IMPRESSIONS 03/17/2019 Clinical Impression Pt presents with moderate oropharyngeal dysphagia, characterized orally by extended oral prep (likely due to absence of dentition), poor bolus formation, and premature spillage. Pharyngeally, pt exhibits delayed swallow reflex with trigger noted at the level of the pyriform sinus on thin and nectar thick consistencies and at the vallecular sinus on puree and solid consistencies. Penetration and aspiration of thin liquids was seen during the swallow, with inconsistent cough response. Nectar thick liquids are tolerated in small individual sips, however, large and consecutive boluses are penetrated and/or aspirated with inconsistent cough response. Puree and solid consistencies were tolerated without penetration, but with post-swallow vallecular and pyriform sinus residue. Pt has difficulty swallowing a second time to clear residue. Pt was unable to demonstrate ability to swallow a barium tablet whole, so crushed meds will be recommended. At this time, recommend soft/chopped solids with nectar thick liquids. Pt should have full supervision during meals for assist with adherence to safe swallow precautions. Pt was sent back to his room with safe swallow  strategies to [facility] at [facility]. SLP will follow for assessment of diet tolerance and education. SLP Visit Diagnosis Dysphagia, oropharyngeal phase (R13.12)     Impact on safety and function Moderate aspiration risk;Risk for inadequate nutrition/hydration   CHL IP TREATMENT RECOMMENDATION 03/17/2019 Treatment Recommendations Therapy as outlined in treatment plan below   Prognosis 03/17/2019 Prognosis for Safe Diet Advancement Fair Barriers to Reach Goals Cognitive deficits Barriers/Prognosis Comment -- CHL IP DIET RECOMMENDATION 03/17/2019 SLP Diet Recommendations Dysphagia 3 (Mech soft) solids;Nectar thick liquid Liquid Administration via Cup;Straw Medication Administration Crushed with puree Compensations Minimize environmental distractions;Slow rate;Small sips/bites;Clear throat intermittently Postural Changes Remain semi-upright after after feeds/meals (Comment);Seated upright at 90 degrees   CHL IP OTHER RECOMMENDATIONS 03/17/2019 Recommended Consults -- Oral Care Recommendations Oral care QID Other Recommendations Order thickener from pharmacy   CHL IP FOLLOW UP RECOMMENDATIONS 03/17/2019 Follow up Recommendations Skilled Nursing facility;24 hour supervision/assistance   CHL IP FREQUENCY AND DURATION 03/17/2019 Speech Therapy Frequency (ACUTE ONLY) min 2x/week Treatment Duration 1 week;2 weeks      CHL IP ORAL PHASE 03/17/2019 Oral Phase Impaired Oral - Nectar Cup/Straw Premature spillage;Reduced posterior propulsion Oral - Thin Cup Premature spillage Oral - Puree Reduced posterior propulsion;Premature spillage Oral - Regular Reduced posterior propulsion;Premature spillage  CHL IP PHARYNGEAL PHASE 03/17/2019 Pharyngeal Phase Impaired Pharyngeal- Nectar Cup Delayed swallow initiation-pyriform sinuses;Reduced airway/laryngeal closure;Penetration/Aspiration during swallow;Pharyngeal residue - pyriform;Pharyngeal residue - valleculae;Reduced tongue base retraction Pharyngeal Material does not enter airway;Material enters  airway, remains ABOVE vocal cords then ejected out;Material enters airway, CONTACTS cords and not ejected out Pharyngeal- Thin Cup Delayed swallow initiation-pyriform sinuses;Penetration/Aspiration during swallow;Moderate aspiration;Pharyngeal residue - valleculae;Pharyngeal residue - pyriform;Reduced airway/laryngeal closure;Reduced tongue base retraction Pharyngeal Material enters airway, passes BELOW cords  without attempt by patient to eject out (silent aspiration);Material enters airway, passes BELOW cords and not ejected out despite cough attempt by patient Pharyngeal- Puree Delayed swallow initiation-vallecula;Reduced tongue base retraction;Pharyngeal residue - valleculae;Pharyngeal residue - pyriform Pharyngeal- Regular Delayed swallow initiation-vallecula;Reduced tongue base retraction;Pharyngeal residue - valleculae;Pharyngeal residue - pyriform  CHL IP CERVICAL ESOPHAGEAL PHASE 03/17/2019 Cervical Esophageal Phase [facility] Celia B. [facility], CCC-SLP Speech Language Pathologist [phone] Leigh Aurora 03/17/2019, 10:32 AM              Dg C-arm 1-60 Min  Result Date: 03/15/2019 CLINICAL DATA:  The patient suffered a left intertrochanteric fracture in a fall 03/11/2019. Intraoperative imaging for fixation. Initial encounter. EXAM: OPERATIVE LEFT  HIP (WITH PELVIS IF PERFORMED) 2 VIEWS TECHNIQUE: Fluoroscopic spot image(s) were submitted for interpretation post-operatively. COMPARISON:  Plain films left hip 03/11/2019. FINDINGS: Two intraoperative fluoroscopic spot views demonstrate a hip screw and short intramedullary nail with a single distal screw in place for fixation of an intertrochanteric fracture. Position and alignment are improved. No new abnormality. IMPRESSION: Intraoperative imaging for fixation a left intertrochanteric fracture. No acute finding. Electronically Signed   By: Drusilla Kanner M.D.   On: 03/15/2019 10:05   Dg Hip Operative Unilat W Or W/o Pelvis Left  Result Date:  03/15/2019 CLINICAL DATA:  The patient suffered a left intertrochanteric fracture in a fall 03/11/2019. Intraoperative imaging for fixation. Initial encounter. EXAM: OPERATIVE LEFT  HIP (WITH PELVIS IF PERFORMED) 2 VIEWS TECHNIQUE: Fluoroscopic spot image(s) were submitted for interpretation post-operatively. COMPARISON:  Plain films left hip 03/11/2019. FINDINGS: Two intraoperative fluoroscopic spot views demonstrate a hip screw and short intramedullary nail with a single distal screw in place for fixation of an intertrochanteric fracture. Position and alignment are improved. No new abnormality. IMPRESSION: Intraoperative imaging for fixation a left intertrochanteric fracture. No acute finding. Electronically Signed   By: Drusilla Kanner M.D.   On: 03/15/2019 10:05   Dg Hip Unilat W Or Wo Pelvis 2-3 Views Left  Result Date: 03/11/2019 CLINICAL DATA:  Fall EXAM: DG HIP (WITH OR WITHOUT PELVIS) 2-3V LEFT COMPARISON:  None. FINDINGS: Minimally displaced intertrochanteric fracture of the proximal left femur. No femoroacetabular dislocation. Limited visualization of the remainder of the pelvis. IMPRESSION: Minimally displaced intertrochanteric fracture of the left femur. Electronically Signed   By: Deatra Robinson M.D.   On: 03/11/2019 20:22    Subjective: No acute issues or events overnight, resting well, review of systems limited given patient's mental status   Discharge Exam: Vitals:   03/19/19 1923 03/20/19 0413  BP: 123/72 (!) 139/57  Pulse: 75 (!) 55  Resp: 17 16  Temp: 98.2 F (36.8 C) 98.2 F (36.8 C)  SpO2: 94% 97%   Vitals:   03/19/19 0801 03/19/19 1505 03/19/19 1923 03/20/19 0413  BP: 99/61 133/60 123/72 (!) 139/57  Pulse: (!) 51 (!) 57 75 (!) 55  Resp: 16 16 17 16   Temp:  97.7 F (36.5 C) 98.2 F (36.8 C) 98.2 F (36.8 C)  TempSrc:  Oral Oral   SpO2: 96% 95% 94% 97%  Weight:      Height:        General:  Pleasantly resting in bed, No acute distress. HEENT:  Normocephalic  atraumatic.  Sclerae nonicteric, noninjected.  Extraocular movements intact bilaterally. Neck:  Without mass or deformity.  Trachea is midline. Lungs:  Clear to auscultate bilaterally without rhonchi, wheeze, or rales. Heart:  Regular rate and rhythm.  Without murmurs, rubs, or gallops. Abdomen:  Soft, nontender, nondistended.  Without guarding or rebound. Extremities: Without cyanosis, clubbing, edema, or obvious deformity. Vascular:  Dorsalis pedis and posterior tibial pulses palpable bilaterally. Skin:  Warm and dry, no erythema, no ulcerations.   The results of significant diagnostics from this hospitalization (including imaging, microbiology, ancillary and laboratory) are listed below for reference.     Microbiology: Recent Results (from the past 240 hour(s))  Urine culture     Status: None   Collection Time: 03/11/19  5:33 PM   Specimen: In/Out Cath Urine  Result Value Ref Range Status   Specimen Description   Final    IN/OUT CATH URINE Performed at [facility], [address] [place], [place] [address]    Special Requests   Final    NONE Performed at [facility], [address] [place], [place] [address]    Culture   Final    NO GROWTH Performed at [facility], [address] [place], [place] [address]    Report Status 03/13/2019 FINAL  Final  SARS Coronavirus 2  Hospital order, Performed in [facility] lab) Nasopharyngeal Nasopharyngeal Swab     Status: None   Collection Time: 03/11/19  5:36 PM   Specimen: Nasopharyngeal Swab  Result Value Ref Range Status   SARS Coronavirus 2 NEGATIVE NEGATIVE Final    Comment: (NOTE) If result is NEGATIVE SARS-CoV-2 target nucleic acids are NOT DETECTED. The SARS-CoV-2 RNA is generally detectable in upper and lower  respiratory specimens during the acute phase of infection. The lowest  concentration of SARS-CoV-2 viral copies this assay can detect is 250  copies / mL. A negative result does not preclude  SARS-CoV-2 infection  and should not be used as the sole basis for treatment or other  patient management decisions.  A negative result may occur with  improper specimen collection / handling, submission of specimen other  than nasopharyngeal swab, presence of viral mutation(s) within the  areas targeted by this assay, and inadequate number of viral copies  (<250 copies / mL). A negative result must be combined with clinical  observations, patient history, and epidemiological information. If result is POSITIVE SARS-CoV-2 target nucleic acids are DETECTED. The SARS-CoV-2 RNA is generally detectable in upper and lower  respiratory specimens dur ing the acute phase of infection.  Positive  results are indicative of active infection with SARS-CoV-2.  Clinical  correlation with patient history and other diagnostic information is  necessary to determine patient infection status.  Positive results do  not rule out bacterial infection or co-infection with other viruses. If result is PRESUMPTIVE POSTIVE SARS-CoV-2 nucleic acids MAY BE PRESENT.   A presumptive positive result was obtained on the submitted specimen  and confirmed on repeat testing.  While 2019 novel coronavirus  (SARS-CoV-2) nucleic acids may be present in the submitted sample  additional confirmatory testing may be necessary for epidemiological  and / or clinical management purposes  to differentiate between  SARS-CoV-2 and other Sarbecovirus currently known to infect humans.  If clinically indicated additional testing with an alternate test  methodology [phone]) is advised. The SARS-CoV-2 RNA is generally  detectable in upper and lower respiratory sp ecimens during the acute  phase of infection. The expected result is Negative. Fact Sheet for Patients:  BoilerBrush.com.cy Fact Sheet for Healthcare Providers: https://pope.com/ This test is not yet approved or cleared by the  [place] FDA and has been authorized for detection and/or diagnosis of SARS-CoV-2 by FDA under an Emergency Use Authorization (EUA).  This EUA will remain in effect (meaning this test can be used) for the duration of the COVID-19 declaration under Section 564(b)(1) of the Act, 21 U.S.C. section 360bbb-3(b)(1), unless the authorization is terminated or revoked sooner. Performed at [facility], [address], [place], [place] [address]   Blood Culture (routine x 2)     Status: None   Collection Time: 03/11/19  5:52 PM   Specimen: Right Antecubital; Blood  Result Value Ref Range Status   Specimen Description RIGHT ANTECUBITAL  Final   Special Requests   Final    BOTTLES DRAWN AEROBIC AND ANAEROBIC Blood Culture results may not be optimal due to an excessive volume of blood received in culture bottles   Culture   Final    NO GROWTH 5 DAYS Performed at [facility], [address], [place], [place] [address]    Report Status 03/16/2019 FINAL  Final  Blood Culture (routine x 2)     Status: None   Collection Time: 03/11/19  6:00 PM   Specimen: BLOOD LEFT WRIST  Result Value Ref Range Status   Specimen Description BLOOD LEFT WRIST  Final   Special Requests   Final    BOTTLES DRAWN AEROBIC AND ANAEROBIC Blood Culture adequate volume   Culture   Final    NO GROWTH 5 DAYS Performed at [facility], [address] [place], [place] [address]    Report Status 03/16/2019 FINAL  Final  MRSA PCR Screening     Status: None   Collection Time: 03/11/19 11:21 PM   Specimen: Nasal Mucosa; Nasopharyngeal  Result Value Ref Range Status   MRSA by PCR NEGATIVE NEGATIVE Final    Comment:        The GeneXpert MRSA Assay (FDA approved for NASAL specimens only), is one component of a comprehensive MRSA colonization surveillance program. It is not intended to diagnose MRSA infection nor to guide or monitor treatment for MRSA infections. Performed at [facility], [address],  [place], [place] [address]   Surgical pcr screen     Status: None   Collection Time: 03/14/19 11:18 PM   Specimen: Nasal Mucosa; Nasal Swab  Result Value Ref Range Status   MRSA, PCR NEGATIVE NEGATIVE Final   Staphylococcus aureus NEGATIVE NEGATIVE Final    Comment: (NOTE) The Xpert SA Assay (FDA approved for NASAL specimens in patients 83 years of age and older), is one component of a comprehensive surveillance program. It is not intended to diagnose infection nor to guide or monitor treatment. Performed at [facility], [address] [place], [place] [address]   SARS CORONAVIRUS 2 (TAT 6-24 HRS) Nasopharyngeal Nasopharyngeal Swab     Status: None   Collection Time: 03/19/19  2:33 PM   Specimen: Nasopharyngeal Swab  Result Value Ref Range Status   SARS Coronavirus 2 NEGATIVE NEGATIVE Final    Comment: (NOTE) SARS-CoV-2 target nucleic acids are NOT DETECTED. The SARS-CoV-2 RNA is generally detectable in upper and lower respiratory specimens during the acute phase of infection. Negative results do not preclude SARS-CoV-2 infection, do not rule out co-infections with other pathogens, and should not be used as the sole basis for treatment or other patient management decisions. Negative results must be combined with clinical observations, patient history, and epidemiological information. The expected result is Negative. Fact Sheet for Patients: HairSlick.no Fact Sheet for Healthcare Providers: quierodirigir.com This test is not yet approved or cleared by the [place] FDA and  has been authorized for detection  and/or diagnosis of SARS-CoV-2 by FDA under an Emergency Use Authorization (EUA). This EUA will remain  in effect (meaning this test can be used) for the duration of the COVID-19 declaration under Section 56 4(b)(1) of the Act, 21 U.S.C. section 360bbb-3(b)(1), unless the authorization is terminated or revoked  sooner. Performed at [facility], [address], [place], [place] [address]      Labs: BNP (last 3 results) Recent Labs    02/26/19 1215  BNP 130.0*   Basic Metabolic Panel: Recent Labs  Lab 03/14/19 1124 03/15/19 1229 03/16/19 0409 03/17/19 0423  NA 141  --  138 138  K 4.1  --  4.0 3.5  CL 113*  --  108 107  CO2 23  --  22 24  GLUCOSE 127*  --  98 116*  BUN 33*  --  21 15  CREATININE 0.70 0.84 0.69 0.75  CALCIUM 8.6*  --  8.5* 8.5*  MG 2.4  --   --   --    Liver Function Tests: Recent Labs  Lab 03/14/19 1124  AST 24  ALT 26  ALKPHOS 73  BILITOT 1.9*  PROT 5.8*  ALBUMIN 2.6*   No results for input(s): LIPASE, AMYLASE in the last 168 hours. No results for input(s): AMMONIA in the last 168 hours. CBC: Recent Labs  Lab 03/14/19 1124 03/15/19 1229 03/16/19 0409 03/17/19 0423 03/18/19 0456  WBC 7.5 7.9 7.0 8.3 8.3  HGB 12.0* 11.7* 11.2* 11.3* 10.8*  HCT 37.8* 36.4* 35.3* 35.0* 33.5*  MCV 95.9 95.5 95.9 93.8 95.4  PLT 138* 123* 131* 138* 154   Urinalysis    Component Value Date/Time   COLORURINE AMBER (A) 03/11/2019 1733   APPEARANCEUR HAZY (A) 03/11/2019 1733   LABSPEC 1.017 03/11/2019 1733   PHURINE 5.0 03/11/2019 1733   GLUCOSEU NEGATIVE 03/11/2019 1733   HGBUR MODERATE (A) 03/11/2019 1733   BILIRUBINUR NEGATIVE 03/11/2019 1733   KETONESUR NEGATIVE 03/11/2019 1733   PROTEINUR 30 (A) 03/11/2019 1733   UROBILINOGEN 0.2 10/23/2009 1700   NITRITE NEGATIVE 03/11/2019 1733   LEUKOCYTESUR MODERATE (A) 03/11/2019 1733   Sepsis Labs Invalid input(s): PROCALCITONIN,  WBC,  LACTICIDVEN Microbiology Recent Results (from the past 240 hour(s))  Urine culture     Status: None   Collection Time: 03/11/19  5:33 PM   Specimen: In/Out Cath Urine  Result Value Ref Range Status   Specimen Description   Final    IN/OUT CATH URINE Performed at [facility], [address] [place], [place] [address]    Special Requests   Final    NONE Performed at  [facility], [address] [place], [place] [address]    Culture   Final    NO GROWTH Performed at [facility], [address], [place], [place] [address]    Report Status 03/13/2019 FINAL  Final  SARS Coronavirus 2 [facility] order, Performed in [facility] hospital lab) Nasopharyngeal Nasopharyngeal Swab     Status: None   Collection Time: 03/11/19  5:36 PM   Specimen: Nasopharyngeal Swab  Result Value Ref Range Status   SARS Coronavirus 2 NEGATIVE NEGATIVE Final    Comment: (NOTE) If result is NEGATIVE SARS-CoV-2 target nucleic acids are NOT DETECTED. The SARS-CoV-2 RNA is generally detectable in upper and lower  respiratory specimens during the acute phase of infection. The lowest  concentration of SARS-CoV-2 viral copies this assay can detect is 250  copies / mL. A negative result does not  preclude SARS-CoV-2 infection  and should not be used as the sole basis for treatment or other  patient management decisions.  A negative result may occur with  improper specimen collection / handling, submission of specimen other  than nasopharyngeal swab, presence of viral mutation(s) within the  areas targeted by this assay, and inadequate number of viral copies  (<250 copies / mL). A negative result must be combined with clinical  observations, patient history, and epidemiological information. If result is POSITIVE SARS-CoV-2 target nucleic acids are DETECTED. The SARS-CoV-2 RNA is generally detectable in upper and lower  respiratory specimens dur ing the acute phase of infection.  Positive  results are indicative of active infection with SARS-CoV-2.  Clinical  correlation with patient history and other diagnostic information is  necessary to determine patient infection status.  Positive results do  not rule out bacterial infection or co-infection with other viruses. If result is PRESUMPTIVE POSTIVE SARS-CoV-2 nucleic acids MAY BE PRESENT.   A presumptive positive result was  obtained on the submitted specimen  and confirmed on repeat testing.  While 2019 novel coronavirus  (SARS-CoV-2) nucleic acids may be present in the submitted sample  additional confirmatory testing may be necessary for epidemiological  and / or clinical management purposes  to differentiate between  SARS-CoV-2 and other Sarbecovirus currently known to infect humans.  If clinically indicated additional testing with an alternate test  methodology [phone]([id number]) is advised. The SARS-CoV-2 RNA is generally  detectable in upper and lower respiratory sp ecimens during the acute  phase of infection. The expected result is Negative. Fact Sheet for Patients:  BoilerBrush.com.cyhttps://www.fda.gov/media/136312/download Fact Sheet for Healthcare Providers: https://pope.com/https://www.fda.gov/media/136313/download This test is not yet approved or cleared by the [place] FDA and has been authorized for detection and/or diagnosis of SARS-CoV-2 by FDA under an Emergency Use Authorization (EUA).  This EUA will remain in effect (meaning this test can be used) for the duration of the COVID-19 declaration under Section 564(b)(1) of the Act, 21 U.S.C. section 360bbb-3(b)(1), unless the authorization is terminated or revoked sooner. Performed at [facility], [address] [place], [place] [address]   Blood Culture (routine x 2)     Status: None   Collection Time: 03/11/19  5:52 PM   Specimen: Right Antecubital; Blood  Result Value Ref Range Status   Specimen Description RIGHT ANTECUBITAL  Final   Special Requests   Final    BOTTLES DRAWN AEROBIC AND ANAEROBIC Blood Culture results may not be optimal due to an excessive volume of blood received in culture bottles   Culture   Final    NO GROWTH 5 DAYS Performed at [facility], [address] [place], [place] [address]    Report Status 03/16/2019 FINAL  Final  Blood Culture (routine x 2)     Status: None   Collection Time: 03/11/19  6:00 PM   Specimen: BLOOD LEFT WRIST  Result  Value Ref Range Status   Specimen Description BLOOD LEFT WRIST  Final   Special Requests   Final    BOTTLES DRAWN AEROBIC AND ANAEROBIC Blood Culture adequate volume   Culture   Final    NO GROWTH 5 DAYS Performed at [facility], [address] [place], [place] [address]    Report Status 03/16/2019 FINAL  Final  MRSA PCR Screening     Status: None   Collection Time: 03/11/19 11:21 PM   Specimen: Nasal Mucosa; Nasopharyngeal  Result Value Ref Range Status   MRSA by PCR NEGATIVE  NEGATIVE Final    Comment:        The GeneXpert MRSA Assay (FDA approved for NASAL specimens only), is one component of a comprehensive MRSA colonization surveillance program. It is not intended to diagnose MRSA infection nor to guide or monitor treatment for MRSA infections. Performed at [facility], [address] [place], [place] [address]   Surgical pcr screen     Status: None   Collection Time: 03/14/19 11:18 PM   Specimen: Nasal Mucosa; Nasal Swab  Result Value Ref Range Status   MRSA, PCR NEGATIVE NEGATIVE Final   Staphylococcus aureus NEGATIVE NEGATIVE Final    Comment: (NOTE) The Xpert SA Assay (FDA approved for NASAL specimens in patients 83 years of age and older), is one component of a comprehensive surveillance program. It is not intended to diagnose infection nor to guide or monitor treatment. Performed at [facility], [address] [place], [place] [address]   SARS CORONAVIRUS 2 (TAT 6-24 HRS) Nasopharyngeal Nasopharyngeal Swab     Status: None   Collection Time: 03/19/19  2:33 PM   Specimen: Nasopharyngeal Swab  Result Value Ref Range Status   SARS Coronavirus 2 NEGATIVE NEGATIVE Final    Comment: (NOTE) SARS-CoV-2 target nucleic acids are NOT DETECTED. The SARS-CoV-2 RNA is generally detectable in upper and lower respiratory specimens during the acute phase of infection. Negative results do not preclude SARS-CoV-2 infection, do not rule out co-infections with other  pathogens, and should not be used as the sole basis for treatment or other patient management decisions. Negative results must be combined with clinical observations, patient history, and epidemiological information. The expected result is Negative. Fact Sheet for Patients: HairSlick.nohttps://www.fda.gov/media/138098/download Fact Sheet for Healthcare Providers: quierodirigir.comhttps://www.fda.gov/media/138095/download This test is not yet approved or cleared by the [place] FDA and  has been authorized for detection and/or diagnosis of SARS-CoV-2 by FDA under an Emergency Use Authorization (EUA). This EUA will remain  in effect (meaning this test can be used) for the duration of the COVID-19 declaration under Section 56 4(b)(1) of the Act, 21 U.S.C. section 360bbb-3(b)(1), unless the authorization is terminated or revoked sooner. Performed at [facility], [address] [place], [place] [address]      Time coordinating discharge: Over 30 minutes  SIGNED:   Azucena FallenWilliam C Ceairra Mccarver, DO [facility] 03/20/2019, 7:40 AM Pager   If 7PM-7AM, please contact night-coverage [email or web address] Password [email or web address]

## 2019-03-20 NOTE — Progress Notes (Signed)
Gave report to Martin at [facility]

## 2019-03-20 NOTE — Progress Notes (Signed)
Phillip Nolan to be D/C'd Skilled nursing facility per MD order.  Discussed prescriptions and follow up appointments with the patient. Prescriptions given to patient, medication list explained in detail. Pt verbalized understanding.  Allergies as of 03/20/2019      Reactions   Ciprofloxacin    Daughter reports caused more confusion, and INR to be abnormal   Penicillins Rash   .Did it involve swelling of the face/tongue/throat, SOB, or low BP? {No Did it involve sudden or severe rash/hives, skin peeling, or any reaction on the inside of your mouth or nose? Yes Did you need to seek medical attention at a Nolan or doctor's office? Unknown When did it last happen?Childhood If all above answers are "NO", may proceed with cephalosporin use.      Medication List    STOP taking these medications   lisinopril 2.5 MG tablet Commonly known as: ZESTRIL     TAKE these medications   acetaminophen 325 MG tablet Commonly known as: TYLENOL Take 2 tablets (650 mg total) by mouth every 4 (four) hours as needed for headache or mild pain.   OLANZapine zydis 5 MG disintegrating tablet Commonly known as: ZYPREXA Take 1 tablet (5 mg total) by mouth at bedtime.   warfarin 5 MG tablet Commonly known as: COUMADIN Take as directed. If you are unsure how to take this medication, talk to your nurse or doctor. Original instructions: Take 1.5 tablets (7.5 mg total) by mouth every morning. TAKE 1&1/2 TABLETS DAILY or as directed What changed:   when to take this  additional instructions       Vitals:   03/19/19 1923 03/20/19 0413  BP: 123/72 (!) 139/57  Pulse: 75 (!) 55  Resp: 17 16  Temp: 98.2 F (36.8 C) 98.2 F (36.8 C)  SpO2: 94% 97%    Skin clean, dry and intact without evidence of skin break down, evidence of skin tears on bilateral upper and lower extremities, areas are clean, dry, and covered with foam dressings. Left heel dressing has been cleansed and new foam dressing  applied. Left hip dressing, clean, dry, and intact. IV catheter discontinued intact. Site without signs and symptoms of complications. Dressing and pressure applied. Pt denies pain at this time. No complaints noted.  An After Visit Summary was printed and given to Phillip Nolan and extra copy given to daughter per request. Patient escorted via stretcher per Phillip Nolan

## 2019-03-20 NOTE — Consult Note (Signed)
   [facility] CM Inpatient Consult   03/20/2019  Phillip Nolan 1927-11-24 [id number]   Follow up: Care Coordination  Chart reviewed for disposition and follow up needs.  Patient is for transitioning to SNF at [facility].  Patient in the Medicare ACO. Care Coordination with inpatient TOC for update on [facility] Care Management eligible.  85 year old male who presented after a fall. He does have significant past medical history of coronary artery disease, heart failure, paroxysmal atrial fibrillation, mitral valve repair and BPH. Patient sustained a mechanical fall at home andhe was unable to stand back up due to pain and weakness, heremainedon the floor for a prolonged period time until a caregiver assisted him. He was evaluated by physical therapy at home,noted severe ambulatory dysfunction, his oximetry was 82% on room air.   Patient sustained a Closed Left Hip Fracture. Lobular Pneumonia, Dementia.  Plan: Will alert [facility] RN PAC follow for disposition and transition needs for post hospital/facility.  Natividad Brood, RN BSN [facility] Liaison  [phone] business mobile phone Toll free office [phone]  Fax number: [phone] [email or web address]

## 2019-03-20 NOTE — Progress Notes (Signed)
RN has been on hold for 20 minutes waiting to give report to the [facility] in [place]

## 2019-03-20 NOTE — TOC Progression Note (Addendum)
Transition of Care [facility]) - Progression Note    Patient Details  Name: Phillip Nolan MRN: [id number] Date of Birth: 1928/07/09  [facility] CM/SW Contact  Jacalyn Lefevre Edson Snowball, RN Phone Number: 03/20/2019, 8:29 AM  Clinical Narrative:     6 Daughter here , nurse has called report. PTAR called.   Spoke to [place] at [facility]. Patient can be admitted today. Sent discharge summary.   Nurse to call report to [phone] .   Spoke to [place] . Benjamine Mola is coming to hospital at 1000, she wants to visit prior to discharge. She is requesting Nurse Administer at [facility] call her , so she knows exactly what she needs to bring to [facility]. Gerald Stabs will call Benjamine Mola.   Once Benjamine Mola arrives at hospital NCM will call PTAR.   Expected Discharge Plan: Skilled Nursing Facility Barriers to Discharge: Family Issues, Continued Medical Work up  Expected Discharge Plan and Services Expected Discharge Plan: [place] In-house Referral: Clinical Social Work       Expected Discharge Date: 03/20/19                                     Social Determinants of Health (SDOH) Interventions    Readmission Risk Interventions No flowsheet data found.

## 2019-03-20 NOTE — Progress Notes (Signed)
I have spoken with the daughter several times today about care plan, discharge, and contacting CM/MD for daughter. The daughter is very anxious about transfer to [facility].

## 2019-03-21 DIAGNOSIS — I2510 Atherosclerotic heart disease of native coronary artery without angina pectoris: Secondary | ICD-10-CM | POA: Diagnosis not present

## 2019-03-21 DIAGNOSIS — L89622 Pressure ulcer of left heel, stage 2: Secondary | ICD-10-CM | POA: Diagnosis not present

## 2019-03-21 DIAGNOSIS — S72002D Fracture of unspecified part of neck of left femur, subsequent encounter for closed fracture with routine healing: Secondary | ICD-10-CM | POA: Diagnosis not present

## 2019-03-21 DIAGNOSIS — I4891 Unspecified atrial fibrillation: Secondary | ICD-10-CM | POA: Diagnosis not present

## 2019-03-22 DIAGNOSIS — F4322 Adjustment disorder with anxiety: Secondary | ICD-10-CM | POA: Diagnosis not present

## 2019-03-22 DIAGNOSIS — F0390 Unspecified dementia without behavioral disturbance: Secondary | ICD-10-CM | POA: Diagnosis not present

## 2019-03-22 DIAGNOSIS — R4689 Other symptoms and signs involving appearance and behavior: Secondary | ICD-10-CM | POA: Diagnosis not present

## 2019-03-26 ENCOUNTER — Telehealth: Payer: Self-pay

## 2019-03-26 NOTE — Telephone Encounter (Signed)
Dr. Franchot Gallo, Mr Behan daughter would like to talk to you when you get a change, he has been placed in a rehab facility at this time, she states you can reach her at [phone]

## 2019-03-27 DIAGNOSIS — L89620 Pressure ulcer of left heel, unstageable: Secondary | ICD-10-CM | POA: Diagnosis not present

## 2019-03-27 DIAGNOSIS — I2510 Atherosclerotic heart disease of native coronary artery without angina pectoris: Secondary | ICD-10-CM | POA: Diagnosis not present

## 2019-03-27 DIAGNOSIS — I110 Hypertensive heart disease with heart failure: Secondary | ICD-10-CM | POA: Diagnosis not present

## 2019-03-27 DIAGNOSIS — D649 Anemia, unspecified: Secondary | ICD-10-CM | POA: Diagnosis not present

## 2019-03-27 DIAGNOSIS — N400 Enlarged prostate without lower urinary tract symptoms: Secondary | ICD-10-CM | POA: Diagnosis not present

## 2019-03-29 DIAGNOSIS — F4322 Adjustment disorder with anxiety: Secondary | ICD-10-CM | POA: Diagnosis not present

## 2019-03-29 DIAGNOSIS — S72002D Fracture of unspecified part of neck of left femur, subsequent encounter for closed fracture with routine healing: Secondary | ICD-10-CM | POA: Diagnosis not present

## 2019-03-29 DIAGNOSIS — I5089 Other heart failure: Secondary | ICD-10-CM | POA: Diagnosis not present

## 2019-03-29 DIAGNOSIS — I4891 Unspecified atrial fibrillation: Secondary | ICD-10-CM | POA: Diagnosis not present

## 2019-04-02 ENCOUNTER — Ambulatory Visit: Payer: Medicare Other | Admitting: Family Medicine

## 2019-04-02 ENCOUNTER — Ambulatory Visit: Payer: Medicare Other | Admitting: Cardiology

## 2019-04-03 DIAGNOSIS — L97429 Non-pressure chronic ulcer of left heel and midfoot with unspecified severity: Secondary | ICD-10-CM | POA: Diagnosis not present

## 2019-04-08 DIAGNOSIS — S72142D Displaced intertrochanteric fracture of left femur, subsequent encounter for closed fracture with routine healing: Secondary | ICD-10-CM | POA: Diagnosis not present

## 2019-04-10 DIAGNOSIS — L89620 Pressure ulcer of left heel, unstageable: Secondary | ICD-10-CM | POA: Diagnosis not present

## 2019-04-12 DIAGNOSIS — R4689 Other symptoms and signs involving appearance and behavior: Secondary | ICD-10-CM | POA: Diagnosis not present

## 2019-04-12 DIAGNOSIS — L89622 Pressure ulcer of left heel, stage 2: Secondary | ICD-10-CM | POA: Diagnosis not present

## 2019-04-12 DIAGNOSIS — F0390 Unspecified dementia without behavioral disturbance: Secondary | ICD-10-CM | POA: Diagnosis not present

## 2019-04-12 DIAGNOSIS — I4891 Unspecified atrial fibrillation: Secondary | ICD-10-CM | POA: Diagnosis not present

## 2019-04-22 DIAGNOSIS — F0390 Unspecified dementia without behavioral disturbance: Secondary | ICD-10-CM | POA: Diagnosis not present

## 2019-04-22 DIAGNOSIS — R4689 Other symptoms and signs involving appearance and behavior: Secondary | ICD-10-CM | POA: Diagnosis not present

## 2019-04-26 ENCOUNTER — Other Ambulatory Visit: Payer: Self-pay

## 2019-04-26 ENCOUNTER — Ambulatory Visit (INDEPENDENT_AMBULATORY_CARE_PROVIDER_SITE_OTHER): Payer: Medicare Other

## 2019-04-26 ENCOUNTER — Ambulatory Visit (INDEPENDENT_AMBULATORY_CARE_PROVIDER_SITE_OTHER): Payer: Medicare Other | Admitting: Cardiology

## 2019-04-26 ENCOUNTER — Encounter: Payer: Self-pay | Admitting: Cardiology

## 2019-04-26 VITALS — BP 119/68 | HR 68

## 2019-04-26 DIAGNOSIS — Z5181 Encounter for therapeutic drug level monitoring: Secondary | ICD-10-CM | POA: Diagnosis not present

## 2019-04-26 DIAGNOSIS — Z953 Presence of xenogenic heart valve: Secondary | ICD-10-CM | POA: Diagnosis not present

## 2019-04-26 DIAGNOSIS — I4821 Permanent atrial fibrillation: Secondary | ICD-10-CM | POA: Diagnosis not present

## 2019-04-26 DIAGNOSIS — I482 Chronic atrial fibrillation, unspecified: Secondary | ICD-10-CM | POA: Diagnosis not present

## 2019-04-26 DIAGNOSIS — I429 Cardiomyopathy, unspecified: Secondary | ICD-10-CM

## 2019-04-26 LAB — POCT INR: INR: 2.1 (ref 2.0–3.0)

## 2019-04-26 NOTE — Patient Instructions (Signed)
Description   Spoke with Phillip Nolan INR 2.1 in office at [place] with Dr Domenic Polite, advised to send orders to [facility] to have pt continue on same dosage of Warfarin (unsure of current dosage given only med list sent to office no MAR).  Will have Edrick Oh, RN follow-up with [facility] on Monday to see if they are presently managing and monitoring pt's Warfarin.  If not pt needs follow-up appt made in [facility].

## 2019-04-26 NOTE — Patient Instructions (Addendum)
Medication Instructions:    Your physician recommends that you continue on your current medications as directed. Please refer to the Current Medication list given to you today.  Labwork:  NONE  Testing/Procedures:  NONE  Follow-Up:  Your physician recommends that you schedule a follow-up appointment in: 6 months for a virtual visit. You will receive a reminder letter in the mail in about 4 months reminding you to call and schedule your appointment. If you don't receive this letter, please contact our office.   Please coordinate INR checks with our coumadin clinic.  Any Other Special Instructions Will Be Listed Below (If Applicable).  If you need a refill on your cardiac medications before your next appointment, please call your pharmacy.

## 2019-04-26 NOTE — Progress Notes (Signed)
Cardiology Office Note  Date: 04/26/2019   ID: Schyler, Counsell [birth year], MRN [id number]  PCP:  Maryruth Hancock, MD  Cardiologist:  Rozann Lesches, MD Electrophysiologist:  None   Chief Complaint  Patient presents with  . Cardiac follow-up    History of Present Illness: Phillip Nolan is an 85 y.o. male that I last saw in July 2019.  He presents to the office today from the [facility], was transported here and left without an attendant.  Scheduling note indicates that the patient's daughter was to come from out of town to be here with him, although she is not present.  It appears that the visit is scheduled for an INR check.  I reviewed the chart to obtain more information.  He apparently sustained a mechanical fall in August and was hospitalized with acute left hip fracture and also sepsis in the setting of community-acquired pneumonia.  With comorbidities including advancing dementia he was discharged to the [facility] after discussion with his daughter. SARS coronavirus 2 test was negative on September 1.  He is in a wheelchair today.  Appears calm.  He is very hard of hearing as was the case previously.  He tells me that he just got married and is moving to [place].  He does not report any chest pain or palpitations.  We reviewed his medications per MAR.  I also had Alma Friendly call the [facility] to speak with clinical staff there.  He is on Coumadin for stroke prophylaxis with atrial fibrillation in the setting of valvular heart disease status post previous bioprosthetic AVR.  He had an echocardiogram done in August during his hospital stay with LVEF 45 to 50% and stable bioprosthetic AVR function.  Patient's INR today was 2.1.  It is not clear who has been managing his Coumadin at the [facility].  Last INR checked through our practice was in August.  Past Medical History:  Diagnosis Date  . Aortic valve disease    Status post AVR 2011  . Atrial  fibrillation ([place])   . BPH (benign prostatic hyperplasia)   . CHF (congestive heart failure) ([place])   . Coronary atherosclerosis of native coronary artery    Nonobstructive  . Dementia ([place])   . Essential hypertension   . History of bacteremia    Streptococcus gordonii bacteremia 2/11 - no clear vegetation by TEE  . HOH (hard of hearing)   . Mitral valve disease    Status post annular repair 2011    Past Surgical History:  Procedure Laterality Date  . INTRAMEDULLARY (IM) NAIL INTERTROCHANTERIC Left 03/15/2019   Procedure: INTRAMEDULLARY (IM) NAIL INTERTROCHANTRIC;  Surgeon: Rod Can, MD;  Location: [place];  Service: Orthopedics;  Laterality: Left;  . Median sterntomy  4/11   Dr. Roxy Manns - AVR (27 mm Edwards pericardial) and MV repair  . MULTIPLE TOOTH EXTRACTIONS  4/11   (pre-op above)    Current Outpatient Medications  Medication Sig Dispense Refill  . acetaminophen (TYLENOL) 325 MG tablet Take 2 tablets (650 mg total) by mouth every 4 (four) hours as needed for headache or mild pain. 12 tablet 1  . OLANZapine (ZYPREXA) 5 MG tablet Take 2.5 mg by mouth at bedtime.    Marland Kitchen warfarin (COUMADIN) 5 MG tablet Take 1.5 tablets (7.5 mg total) by mouth every morning. TAKE 1&1/2 TABLETS DAILY or as directed (Patient taking differently: Take 7.5 mg by mouth every evening. ) 45 tablet 1   No current  facility-administered medications for this visit.    Allergies:  Ciprofloxacin and Penicillins   Social History: The patient  reports that he quit smoking about 40 years ago. His smoking use included pipe and cigars. He has never used smokeless tobacco. He reports that he does not drink alcohol or use drugs.   ROS:  Please see the history of present illness. Otherwise, complete review of systems is positive for chronic hearing loss and memory loss.  All other systems are reviewed and negative.   Physical Exam: VS:  BP 119/68   Pulse 68   SpO2 100% , BMI There is no height or weight on file to  calculate BMI.  Wt Readings from Last 3 Encounters:  03/15/19 198 lb 6.6 oz (90 kg)  03/04/19 183 lb 3.2 oz (83.1 kg)  02/28/19 179 lb 14.3 oz (81.6 kg)    General: Elderly male seated in a wheelchair, appears comfortable at rest. HEENT: Conjunctiva and lids normal, wearing a mask. Neck: Supple, no elevated JVP or carotid bruits, no thyromegaly. Lungs: Clear to auscultation, nonlabored breathing at rest. Cardiac: Irregular, no S3, 2/6 systolic murmur. Abdomen: Soft, nontender, bowel sounds present. Extremities: Venous stasis, distal pulses 2+. Skin: Warm and dry. Musculoskeletal: No kyphosis. Neuropsychiatric: Alert and oriented x2, affect grossly appropriate.  Hearing loss evident.  ECG:  An ECG dated 03/17/2019 was personally reviewed today and demonstrated:  Probable atypical atrial flutter with 2:1 block, right bundle branch block, and PVCs.  Recent Labwork: 02/17/2019: TSH 2.140 02/26/2019: B Natriuretic Peptide 130.0 03/14/2019: ALT 26; AST 24; Magnesium 2.4 03/17/2019: BUN 15; Creatinine, Ser 0.75; Potassium 3.5; Sodium 138 03/18/2019: Hemoglobin 10.8; Platelets 154   Other Studies Reviewed Today:  Echocardiogram 02/27/2019:  1. The left ventricle has mildly reduced systolic function, with an ejection fraction of 45-50%. The cavity size was normal. Severe basal septal hypertrophy. Otherwise, mild LVH of remaining myocardium. Left ventricular diastolic function could not be  evaluated secondary to atrial fibrillation. Left ventricular diffuse hypokinesis.  2. Trivial pericardial effusion is present.  3. The tricuspid valve is grossly normal.  4. A 27 mm Edwards pericardial tissue valve is present in the AV position. Poorly visualized.  5. The aorta is abnormal in size and structure.  6. There is mild dilatation of the aortic root measuring 42 mm.  7. Left atrial size was severely dilated.  8. Right atrial size was mildly dilated.  9. A 36 mm Sorin memo 3D annuloplasty ring is  present at the MV anulus.  Assessment and Plan:  1.  Permanent atrial fibrillation/flutter.  He does not report any palpitations at this time and his heart rate is well controlled at rest.  He is not on any AV nodal blockers at this time but continues on Coumadin for stroke prophylaxis.  Today's INR is 2.1.  He is now a resident of the [facility].  We will try to coordinate INR checks through that facility with management by our anticoagulation clinic.  2.  Aortic stenosis status post bioprosthetic AVR in 2011.  Valve function was normal by recent echocardiogram in August.  3.  Secondary cardiomyopathy with mild LV dysfunction, LVEF 45 to 50% by echocardiogram in August.  Would follow conservatively at this point.  Medication Adjustments/Labs and Tests Ordered: Current medicines are reviewed at length with the patient today.  Concerns regarding medicines are outlined above.   Tests Ordered: No orders of the defined types were placed in this encounter.   Medication Changes: No orders of  the defined types were placed in this encounter.   Disposition:  Follow up Virtual visit in 6 months.  Signed, Jonelle Sidle, MD, [facility] 04/26/2019 12:05 PM    [facility] at [facility] [address] [place], [place], [place] [address] Phone: [phone]; Fax: [phone]

## 2019-05-01 DIAGNOSIS — L89620 Pressure ulcer of left heel, unstageable: Secondary | ICD-10-CM | POA: Diagnosis not present

## 2019-05-07 DIAGNOSIS — R05 Cough: Secondary | ICD-10-CM | POA: Diagnosis not present

## 2019-05-08 DIAGNOSIS — J189 Pneumonia, unspecified organism: Secondary | ICD-10-CM | POA: Diagnosis not present

## 2019-05-08 DIAGNOSIS — L89620 Pressure ulcer of left heel, unstageable: Secondary | ICD-10-CM | POA: Diagnosis not present

## 2019-05-10 DIAGNOSIS — I2510 Atherosclerotic heart disease of native coronary artery without angina pectoris: Secondary | ICD-10-CM | POA: Diagnosis not present

## 2019-05-10 DIAGNOSIS — N400 Enlarged prostate without lower urinary tract symptoms: Secondary | ICD-10-CM | POA: Diagnosis not present

## 2019-05-10 DIAGNOSIS — I110 Hypertensive heart disease with heart failure: Secondary | ICD-10-CM | POA: Diagnosis not present

## 2019-05-10 DIAGNOSIS — D649 Anemia, unspecified: Secondary | ICD-10-CM | POA: Diagnosis not present

## 2019-05-18 DIAGNOSIS — Z881 Allergy status to other antibiotic agents status: Secondary | ICD-10-CM | POA: Diagnosis not present

## 2019-05-18 DIAGNOSIS — Z880 Allergy status to penicillin: Secondary | ICD-10-CM | POA: Diagnosis not present

## 2019-05-18 DIAGNOSIS — R791 Abnormal coagulation profile: Secondary | ICD-10-CM | POA: Diagnosis not present

## 2019-05-18 DIAGNOSIS — F0390 Unspecified dementia without behavioral disturbance: Secondary | ICD-10-CM | POA: Diagnosis not present

## 2019-05-18 DIAGNOSIS — N390 Urinary tract infection, site not specified: Secondary | ICD-10-CM | POA: Diagnosis not present

## 2019-05-18 DIAGNOSIS — I517 Cardiomegaly: Secondary | ICD-10-CM | POA: Diagnosis not present

## 2019-05-19 DIAGNOSIS — R296 Repeated falls: Secondary | ICD-10-CM | POA: Diagnosis not present

## 2019-05-19 DIAGNOSIS — R269 Unspecified abnormalities of gait and mobility: Secondary | ICD-10-CM | POA: Diagnosis not present

## 2019-05-19 DIAGNOSIS — F0390 Unspecified dementia without behavioral disturbance: Secondary | ICD-10-CM | POA: Diagnosis not present

## 2019-05-19 DIAGNOSIS — R791 Abnormal coagulation profile: Secondary | ICD-10-CM | POA: Diagnosis not present

## 2019-05-19 DIAGNOSIS — R799 Abnormal finding of blood chemistry, unspecified: Secondary | ICD-10-CM | POA: Diagnosis not present

## 2019-05-22 DIAGNOSIS — L89620 Pressure ulcer of left heel, unstageable: Secondary | ICD-10-CM | POA: Diagnosis not present

## 2019-05-23 NOTE — Progress Notes (Signed)
Spoke with staff at [facility].  They are managing warfarin now.

## 2019-05-28 DIAGNOSIS — Z23 Encounter for immunization: Secondary | ICD-10-CM | POA: Diagnosis not present

## 2019-06-05 DIAGNOSIS — L89620 Pressure ulcer of left heel, unstageable: Secondary | ICD-10-CM | POA: Diagnosis not present

## 2019-06-05 DIAGNOSIS — F29 Unspecified psychosis not due to a substance or known physiological condition: Secondary | ICD-10-CM | POA: Diagnosis not present

## 2019-06-06 DIAGNOSIS — Z7901 Long term (current) use of anticoagulants: Secondary | ICD-10-CM | POA: Diagnosis not present

## 2019-06-12 DIAGNOSIS — L89620 Pressure ulcer of left heel, unstageable: Secondary | ICD-10-CM | POA: Diagnosis not present

## 2019-06-17 DIAGNOSIS — Z1159 Encounter for screening for other viral diseases: Secondary | ICD-10-CM | POA: Diagnosis not present

## 2019-06-17 DIAGNOSIS — F0390 Unspecified dementia without behavioral disturbance: Secondary | ICD-10-CM | POA: Diagnosis not present

## 2019-06-17 DIAGNOSIS — R4689 Other symptoms and signs involving appearance and behavior: Secondary | ICD-10-CM | POA: Diagnosis not present

## 2019-06-19 DIAGNOSIS — L89624 Pressure ulcer of left heel, stage 4: Secondary | ICD-10-CM | POA: Diagnosis not present

## 2019-06-21 DIAGNOSIS — Z7901 Long term (current) use of anticoagulants: Secondary | ICD-10-CM | POA: Diagnosis not present

## 2019-06-22 DIAGNOSIS — F0390 Unspecified dementia without behavioral disturbance: Secondary | ICD-10-CM | POA: Diagnosis not present

## 2019-06-22 DIAGNOSIS — R4689 Other symptoms and signs involving appearance and behavior: Secondary | ICD-10-CM | POA: Diagnosis not present

## 2019-06-26 DIAGNOSIS — L89624 Pressure ulcer of left heel, stage 4: Secondary | ICD-10-CM | POA: Diagnosis not present

## 2019-06-26 DIAGNOSIS — Z872 Personal history of diseases of the skin and subcutaneous tissue: Secondary | ICD-10-CM | POA: Diagnosis not present

## 2019-06-28 DIAGNOSIS — R791 Abnormal coagulation profile: Secondary | ICD-10-CM | POA: Diagnosis not present

## 2019-07-19 DIAGNOSIS — E861 Hypovolemia: Secondary | ICD-10-CM | POA: Diagnosis present

## 2019-07-19 DIAGNOSIS — R739 Hyperglycemia, unspecified: Secondary | ICD-10-CM | POA: Diagnosis present

## 2019-07-19 DIAGNOSIS — E8881 Metabolic syndrome: Secondary | ICD-10-CM | POA: Diagnosis not present

## 2019-07-19 DIAGNOSIS — E440 Moderate protein-calorie malnutrition: Secondary | ICD-10-CM | POA: Diagnosis not present

## 2019-07-19 DIAGNOSIS — R0902 Hypoxemia: Secondary | ICD-10-CM | POA: Diagnosis not present

## 2019-07-19 DIAGNOSIS — Z951 Presence of aortocoronary bypass graft: Secondary | ICD-10-CM | POA: Diagnosis not present

## 2019-07-19 DIAGNOSIS — Z515 Encounter for palliative care: Secondary | ICD-10-CM | POA: Diagnosis not present

## 2019-07-19 DIAGNOSIS — F0391 Unspecified dementia with behavioral disturbance: Secondary | ICD-10-CM | POA: Diagnosis not present

## 2019-07-19 DIAGNOSIS — S72142D Displaced intertrochanteric fracture of left femur, subsequent encounter for closed fracture with routine healing: Secondary | ICD-10-CM | POA: Diagnosis not present

## 2019-07-19 DIAGNOSIS — R4182 Altered mental status, unspecified: Secondary | ICD-10-CM | POA: Diagnosis not present

## 2019-07-19 DIAGNOSIS — L89893 Pressure ulcer of other site, stage 3: Secondary | ICD-10-CM | POA: Diagnosis not present

## 2019-07-19 DIAGNOSIS — L89150 Pressure ulcer of sacral region, unstageable: Secondary | ICD-10-CM | POA: Diagnosis not present

## 2019-07-19 DIAGNOSIS — Z743 Need for continuous supervision: Secondary | ICD-10-CM | POA: Diagnosis not present

## 2019-07-19 DIAGNOSIS — L309 Dermatitis, unspecified: Secondary | ICD-10-CM | POA: Diagnosis not present

## 2019-07-19 DIAGNOSIS — Z03818 Encounter for observation for suspected exposure to other biological agents ruled out: Secondary | ICD-10-CM | POA: Diagnosis not present

## 2019-07-19 DIAGNOSIS — R82998 Other abnormal findings in urine: Secondary | ICD-10-CM | POA: Diagnosis not present

## 2019-07-19 DIAGNOSIS — Z872 Personal history of diseases of the skin and subcutaneous tissue: Secondary | ICD-10-CM | POA: Diagnosis not present

## 2019-07-19 DIAGNOSIS — R1312 Dysphagia, oropharyngeal phase: Secondary | ICD-10-CM | POA: Diagnosis not present

## 2019-07-19 DIAGNOSIS — Z681 Body mass index (BMI) 19 or less, adult: Secondary | ICD-10-CM | POA: Diagnosis not present

## 2019-07-19 DIAGNOSIS — R1310 Dysphagia, unspecified: Secondary | ICD-10-CM | POA: Diagnosis not present

## 2019-07-19 DIAGNOSIS — I080 Rheumatic disorders of both mitral and aortic valves: Secondary | ICD-10-CM | POA: Diagnosis not present

## 2019-07-19 DIAGNOSIS — L89620 Pressure ulcer of left heel, unstageable: Secondary | ICD-10-CM | POA: Diagnosis present

## 2019-07-19 DIAGNOSIS — S51819A Laceration without foreign body of unspecified forearm, initial encounter: Secondary | ICD-10-CM | POA: Diagnosis not present

## 2019-07-19 DIAGNOSIS — R0602 Shortness of breath: Secondary | ICD-10-CM | POA: Diagnosis not present

## 2019-07-19 DIAGNOSIS — R001 Bradycardia, unspecified: Secondary | ICD-10-CM | POA: Diagnosis not present

## 2019-07-19 DIAGNOSIS — Z1639 Resistance to other specified antimicrobial drug: Secondary | ICD-10-CM | POA: Diagnosis not present

## 2019-07-19 DIAGNOSIS — I509 Heart failure, unspecified: Secondary | ICD-10-CM | POA: Diagnosis not present

## 2019-07-19 DIAGNOSIS — Z881 Allergy status to other antibiotic agents status: Secondary | ICD-10-CM | POA: Diagnosis not present

## 2019-07-19 DIAGNOSIS — I2510 Atherosclerotic heart disease of native coronary artery without angina pectoris: Secondary | ICD-10-CM | POA: Diagnosis present

## 2019-07-19 DIAGNOSIS — R770 Abnormality of albumin: Secondary | ICD-10-CM | POA: Diagnosis not present

## 2019-07-19 DIAGNOSIS — E669 Obesity, unspecified: Secondary | ICD-10-CM | POA: Diagnosis not present

## 2019-07-19 DIAGNOSIS — Z0289 Encounter for other administrative examinations: Secondary | ICD-10-CM | POA: Diagnosis not present

## 2019-07-19 DIAGNOSIS — E039 Hypothyroidism, unspecified: Secondary | ICD-10-CM | POA: Diagnosis present

## 2019-07-19 DIAGNOSIS — I4891 Unspecified atrial fibrillation: Secondary | ICD-10-CM | POA: Diagnosis present

## 2019-07-19 DIAGNOSIS — N390 Urinary tract infection, site not specified: Secondary | ICD-10-CM | POA: Diagnosis present

## 2019-07-19 DIAGNOSIS — F0390 Unspecified dementia without behavioral disturbance: Secondary | ICD-10-CM | POA: Diagnosis not present

## 2019-07-19 DIAGNOSIS — L97429 Non-pressure chronic ulcer of left heel and midfoot with unspecified severity: Secondary | ICD-10-CM | POA: Diagnosis not present

## 2019-07-19 DIAGNOSIS — R319 Hematuria, unspecified: Secondary | ICD-10-CM | POA: Diagnosis not present

## 2019-07-19 DIAGNOSIS — B9689 Other specified bacterial agents as the cause of diseases classified elsewhere: Secondary | ICD-10-CM | POA: Diagnosis present

## 2019-07-19 DIAGNOSIS — I248 Other forms of acute ischemic heart disease: Secondary | ICD-10-CM | POA: Diagnosis not present

## 2019-07-19 DIAGNOSIS — I5022 Chronic systolic (congestive) heart failure: Secondary | ICD-10-CM | POA: Diagnosis not present

## 2019-07-19 DIAGNOSIS — R791 Abnormal coagulation profile: Secondary | ICD-10-CM | POA: Diagnosis present

## 2019-07-19 DIAGNOSIS — S72002D Fracture of unspecified part of neck of left femur, subsequent encounter for closed fracture with routine healing: Secondary | ICD-10-CM | POA: Diagnosis not present

## 2019-07-19 DIAGNOSIS — Z7901 Long term (current) use of anticoagulants: Secondary | ICD-10-CM | POA: Diagnosis not present

## 2019-07-19 DIAGNOSIS — H9190 Unspecified hearing loss, unspecified ear: Secondary | ICD-10-CM | POA: Diagnosis not present

## 2019-07-19 DIAGNOSIS — E878 Other disorders of electrolyte and fluid balance, not elsewhere classified: Secondary | ICD-10-CM | POA: Diagnosis present

## 2019-07-19 DIAGNOSIS — L89153 Pressure ulcer of sacral region, stage 3: Secondary | ICD-10-CM | POA: Diagnosis not present

## 2019-07-19 DIAGNOSIS — Z9181 History of falling: Secondary | ICD-10-CM | POA: Diagnosis not present

## 2019-07-19 DIAGNOSIS — L89609 Pressure ulcer of unspecified heel, unspecified stage: Secondary | ICD-10-CM | POA: Diagnosis not present

## 2019-07-19 DIAGNOSIS — E119 Type 2 diabetes mellitus without complications: Secondary | ICD-10-CM | POA: Diagnosis not present

## 2019-07-19 DIAGNOSIS — E870 Hyperosmolality and hypernatremia: Secondary | ICD-10-CM | POA: Diagnosis not present

## 2019-07-19 DIAGNOSIS — Z1152 Encounter for screening for COVID-19: Secondary | ICD-10-CM | POA: Diagnosis not present

## 2019-07-19 DIAGNOSIS — Z7401 Bed confinement status: Secondary | ICD-10-CM | POA: Diagnosis not present

## 2019-07-19 DIAGNOSIS — G9341 Metabolic encephalopathy: Secondary | ICD-10-CM | POA: Diagnosis present

## 2019-07-19 DIAGNOSIS — I10 Essential (primary) hypertension: Secondary | ICD-10-CM | POA: Diagnosis not present

## 2019-07-19 DIAGNOSIS — Z953 Presence of xenogenic heart valve: Secondary | ICD-10-CM | POA: Diagnosis not present

## 2019-07-19 DIAGNOSIS — R471 Dysarthria and anarthria: Secondary | ICD-10-CM | POA: Diagnosis not present

## 2019-07-19 DIAGNOSIS — I482 Chronic atrial fibrillation, unspecified: Secondary | ICD-10-CM | POA: Diagnosis not present

## 2019-07-19 DIAGNOSIS — I4821 Permanent atrial fibrillation: Secondary | ICD-10-CM | POA: Diagnosis not present

## 2019-07-19 DIAGNOSIS — Z952 Presence of prosthetic heart valve: Secondary | ICD-10-CM | POA: Diagnosis not present

## 2019-07-19 DIAGNOSIS — Z23 Encounter for immunization: Secondary | ICD-10-CM | POA: Diagnosis not present

## 2019-07-19 DIAGNOSIS — L97419 Non-pressure chronic ulcer of right heel and midfoot with unspecified severity: Secondary | ICD-10-CM | POA: Diagnosis not present

## 2019-07-19 DIAGNOSIS — R748 Abnormal levels of other serum enzymes: Secondary | ICD-10-CM | POA: Diagnosis not present

## 2019-07-19 DIAGNOSIS — E559 Vitamin D deficiency, unspecified: Secondary | ICD-10-CM | POA: Diagnosis not present

## 2019-07-19 DIAGNOSIS — Z431 Encounter for attention to gastrostomy: Secondary | ICD-10-CM | POA: Diagnosis not present

## 2019-07-19 DIAGNOSIS — R1900 Intra-abdominal and pelvic swelling, mass and lump, unspecified site: Secondary | ICD-10-CM | POA: Diagnosis not present

## 2019-07-19 DIAGNOSIS — M2550 Pain in unspecified joint: Secondary | ICD-10-CM | POA: Diagnosis not present

## 2019-07-19 DIAGNOSIS — E860 Dehydration: Secondary | ICD-10-CM | POA: Diagnosis present

## 2019-07-19 DIAGNOSIS — N401 Enlarged prostate with lower urinary tract symptoms: Secondary | ICD-10-CM | POA: Diagnosis not present

## 2019-07-19 DIAGNOSIS — R404 Transient alteration of awareness: Secondary | ICD-10-CM | POA: Diagnosis not present

## 2019-07-19 DIAGNOSIS — U071 COVID-19: Secondary | ICD-10-CM | POA: Diagnosis not present

## 2019-07-19 DIAGNOSIS — R8290 Unspecified abnormal findings in urine: Secondary | ICD-10-CM | POA: Diagnosis not present

## 2019-07-19 DIAGNOSIS — J9601 Acute respiratory failure with hypoxia: Secondary | ICD-10-CM | POA: Diagnosis not present

## 2019-07-19 DIAGNOSIS — R269 Unspecified abnormalities of gait and mobility: Secondary | ICD-10-CM | POA: Diagnosis present

## 2019-07-19 DIAGNOSIS — R52 Pain, unspecified: Secondary | ICD-10-CM | POA: Diagnosis not present

## 2019-07-19 DIAGNOSIS — N400 Enlarged prostate without lower urinary tract symptoms: Secondary | ICD-10-CM | POA: Diagnosis not present

## 2019-07-19 DIAGNOSIS — E46 Unspecified protein-calorie malnutrition: Secondary | ICD-10-CM | POA: Diagnosis not present

## 2019-07-19 DIAGNOSIS — L89614 Pressure ulcer of right heel, stage 4: Secondary | ICD-10-CM | POA: Diagnosis not present

## 2019-07-19 DIAGNOSIS — R262 Difficulty in walking, not elsewhere classified: Secondary | ICD-10-CM | POA: Diagnosis not present

## 2019-07-19 DIAGNOSIS — Z87891 Personal history of nicotine dependence: Secondary | ICD-10-CM | POA: Diagnosis not present

## 2019-07-19 DIAGNOSIS — J189 Pneumonia, unspecified organism: Secondary | ICD-10-CM | POA: Diagnosis not present

## 2019-07-19 DIAGNOSIS — Z79899 Other long term (current) drug therapy: Secondary | ICD-10-CM | POA: Diagnosis not present

## 2019-07-19 DIAGNOSIS — I351 Nonrheumatic aortic (valve) insufficiency: Secondary | ICD-10-CM | POA: Diagnosis not present

## 2019-07-19 DIAGNOSIS — D696 Thrombocytopenia, unspecified: Secondary | ICD-10-CM | POA: Diagnosis present

## 2019-07-19 DIAGNOSIS — R7989 Other specified abnormal findings of blood chemistry: Secondary | ICD-10-CM | POA: Diagnosis not present

## 2019-07-19 DIAGNOSIS — I480 Paroxysmal atrial fibrillation: Secondary | ICD-10-CM | POA: Diagnosis not present

## 2019-07-19 DIAGNOSIS — I110 Hypertensive heart disease with heart failure: Secondary | ICD-10-CM | POA: Diagnosis not present

## 2019-07-19 DIAGNOSIS — G4700 Insomnia, unspecified: Secondary | ICD-10-CM | POA: Diagnosis not present

## 2019-07-19 DIAGNOSIS — R69 Illness, unspecified: Secondary | ICD-10-CM | POA: Diagnosis not present

## 2019-07-19 DIAGNOSIS — W19XXXA Unspecified fall, initial encounter: Secondary | ICD-10-CM | POA: Diagnosis not present

## 2019-07-19 DIAGNOSIS — J1282 Pneumonia due to coronavirus disease 2019: Secondary | ICD-10-CM | POA: Diagnosis not present

## 2019-07-19 DIAGNOSIS — Z880 Allergy status to penicillin: Secondary | ICD-10-CM | POA: Diagnosis not present

## 2019-07-19 DIAGNOSIS — Z741 Need for assistance with personal care: Secondary | ICD-10-CM | POA: Diagnosis not present

## 2019-07-19 DIAGNOSIS — D649 Anemia, unspecified: Secondary | ICD-10-CM | POA: Diagnosis present

## 2019-07-19 DIAGNOSIS — R627 Adult failure to thrive: Secondary | ICD-10-CM | POA: Diagnosis not present

## 2019-07-19 DIAGNOSIS — M6281 Muscle weakness (generalized): Secondary | ICD-10-CM | POA: Diagnosis not present

## 2019-07-19 DIAGNOSIS — E785 Hyperlipidemia, unspecified: Secondary | ICD-10-CM | POA: Diagnosis not present

## 2019-07-19 DIAGNOSIS — N179 Acute kidney failure, unspecified: Secondary | ICD-10-CM | POA: Diagnosis not present

## 2019-07-19 DIAGNOSIS — R41841 Cognitive communication deficit: Secondary | ICD-10-CM | POA: Diagnosis not present

## 2019-07-19 DIAGNOSIS — L89624 Pressure ulcer of left heel, stage 4: Secondary | ICD-10-CM | POA: Diagnosis not present

## 2019-07-19 DIAGNOSIS — Z8616 Personal history of COVID-19: Secondary | ICD-10-CM | POA: Diagnosis not present

## 2019-08-02 DIAGNOSIS — U071 COVID-19: Secondary | ICD-10-CM | POA: Diagnosis not present

## 2019-08-02 DIAGNOSIS — Z1152 Encounter for screening for COVID-19: Secondary | ICD-10-CM | POA: Diagnosis not present

## 2019-08-06 DIAGNOSIS — Z23 Encounter for immunization: Secondary | ICD-10-CM | POA: Diagnosis not present

## 2019-08-07 DIAGNOSIS — I110 Hypertensive heart disease with heart failure: Secondary | ICD-10-CM | POA: Diagnosis not present

## 2019-08-07 DIAGNOSIS — D649 Anemia, unspecified: Secondary | ICD-10-CM | POA: Diagnosis not present

## 2019-08-07 DIAGNOSIS — N400 Enlarged prostate without lower urinary tract symptoms: Secondary | ICD-10-CM | POA: Diagnosis not present

## 2019-08-07 DIAGNOSIS — I2510 Atherosclerotic heart disease of native coronary artery without angina pectoris: Secondary | ICD-10-CM | POA: Diagnosis not present

## 2019-08-08 ENCOUNTER — Encounter (HOSPITAL_COMMUNITY): Payer: Self-pay

## 2019-08-08 ENCOUNTER — Emergency Department (HOSPITAL_COMMUNITY): Payer: Medicare Other

## 2019-08-08 ENCOUNTER — Emergency Department (HOSPITAL_COMMUNITY)
Admission: EM | Admit: 2019-08-08 | Discharge: 2019-08-09 | Disposition: A | Discharge status: Home / Self Care | Payer: Medicare Other | Attending: Emergency Medicine | Admitting: Emergency Medicine

## 2019-08-08 ENCOUNTER — Other Ambulatory Visit: Payer: Self-pay

## 2019-08-08 DIAGNOSIS — Z7901 Long term (current) use of anticoagulants: Secondary | ICD-10-CM | POA: Diagnosis not present

## 2019-08-08 DIAGNOSIS — R001 Bradycardia, unspecified: Secondary | ICD-10-CM | POA: Diagnosis not present

## 2019-08-08 DIAGNOSIS — R52 Pain, unspecified: Secondary | ICD-10-CM | POA: Diagnosis not present

## 2019-08-08 DIAGNOSIS — U071 COVID-19: Secondary | ICD-10-CM | POA: Diagnosis not present

## 2019-08-08 DIAGNOSIS — I509 Heart failure, unspecified: Secondary | ICD-10-CM | POA: Insufficient documentation

## 2019-08-08 DIAGNOSIS — R404 Transient alteration of awareness: Secondary | ICD-10-CM | POA: Diagnosis not present

## 2019-08-08 DIAGNOSIS — I2510 Atherosclerotic heart disease of native coronary artery without angina pectoris: Secondary | ICD-10-CM | POA: Diagnosis not present

## 2019-08-08 DIAGNOSIS — Z87891 Personal history of nicotine dependence: Secondary | ICD-10-CM | POA: Diagnosis not present

## 2019-08-08 DIAGNOSIS — R0602 Shortness of breath: Secondary | ICD-10-CM | POA: Diagnosis not present

## 2019-08-08 DIAGNOSIS — I10 Essential (primary) hypertension: Secondary | ICD-10-CM | POA: Insufficient documentation

## 2019-08-08 DIAGNOSIS — R0902 Hypoxemia: Secondary | ICD-10-CM | POA: Insufficient documentation

## 2019-08-08 DIAGNOSIS — R4182 Altered mental status, unspecified: Secondary | ICD-10-CM | POA: Diagnosis not present

## 2019-08-08 LAB — CBC WITH DIFFERENTIAL/PLATELET
Abs Immature Granulocytes: 0.01 10*3/uL (ref 0.00–0.07)
Basophils Absolute: 0 10*3/uL (ref 0.0–0.1)
Basophils Relative: 0 %
Eosinophils Absolute: 0 10*3/uL (ref 0.0–0.5)
Eosinophils Relative: 0 %
HCT: 47.6 % (ref 39.0–52.0)
Hemoglobin: 14.7 g/dL (ref 13.0–17.0)
Immature Granulocytes: 0 %
Lymphocytes Relative: 23 %
Lymphs Abs: 1.1 10*3/uL (ref 0.7–4.0)
MCH: 27.8 pg (ref 26.0–34.0)
MCHC: 30.9 g/dL (ref 30.0–36.0)
MCV: 90.2 fL (ref 80.0–100.0)
Monocytes Absolute: 0.4 10*3/uL (ref 0.1–1.0)
Monocytes Relative: 9 %
Neutro Abs: 3.2 10*3/uL (ref 1.7–7.7)
Neutrophils Relative %: 68 %
Platelets: 85 10*3/uL — ABNORMAL LOW (ref 150–400)
RBC: 5.28 MIL/uL (ref 4.22–5.81)
RDW: 16.5 % — ABNORMAL HIGH (ref 11.5–15.5)
WBC: 4.8 10*3/uL (ref 4.0–10.5)
nRBC: 0 % (ref 0.0–0.2)

## 2019-08-08 LAB — COMPREHENSIVE METABOLIC PANEL
ALT: 17 U/L (ref 0–44)
AST: 39 U/L (ref 15–41)
Albumin: 3.1 g/dL — ABNORMAL LOW (ref 3.5–5.0)
Alkaline Phosphatase: 67 U/L (ref 38–126)
Anion gap: 12 (ref 5–15)
BUN: 53 mg/dL — ABNORMAL HIGH (ref 8–23)
CO2: 25 mmol/L (ref 22–32)
Calcium: 9.2 mg/dL (ref 8.9–10.3)
Chloride: 111 mmol/L (ref 98–111)
Creatinine, Ser: 1.34 mg/dL — ABNORMAL HIGH (ref 0.61–1.24)
GFR calc Af Amer: 54 mL/min — ABNORMAL LOW (ref >60)
GFR calc non Af Amer: 47 mL/min — ABNORMAL LOW (ref >60)
Glucose, Bld: 120 mg/dL — ABNORMAL HIGH (ref 70–99)
Potassium: 4.2 mmol/L (ref 3.5–5.1)
Sodium: 148 mmol/L — ABNORMAL HIGH (ref 135–145)
Total Bilirubin: 0.9 mg/dL (ref 0.3–1.2)
Total Protein: 7.2 g/dL (ref 6.5–8.1)

## 2019-08-08 LAB — FIBRINOGEN: Fibrinogen: 577 mg/dL — ABNORMAL HIGH (ref 210–475)

## 2019-08-08 LAB — FERRITIN: Ferritin: 1198 ng/mL — ABNORMAL HIGH (ref 24–336)

## 2019-08-08 LAB — LACTATE DEHYDROGENASE: LDH: 219 U/L — ABNORMAL HIGH (ref 98–192)

## 2019-08-08 LAB — PROCALCITONIN: Procalcitonin: <0.1 ng/mL

## 2019-08-08 LAB — TROPONIN I (HIGH SENSITIVITY)
Troponin I (High Sensitivity): 80 ng/L — ABNORMAL HIGH (ref <18)
Troponin I (High Sensitivity): 80 ng/L — ABNORMAL HIGH (ref <18)

## 2019-08-08 LAB — ABO/RH: ABO/RH(D): O NEG

## 2019-08-08 LAB — D-DIMER, QUANTITATIVE: D-Dimer, Quant: 1.64 ug/mL-FEU — ABNORMAL HIGH (ref 0.00–0.50)

## 2019-08-08 NOTE — ED Notes (Signed)
Report called to [facility] of [place], spoke to Isabel.

## 2019-08-08 NOTE — ED Triage Notes (Addendum)
Staff at [facility] called EMS after getting low oxygen saturation on pt. Pt O2 was 84% per nursing staff. EMS states upon arrival pt O2 saturation read 96%. Nursign center staff directed EMS to take pt to ER. PT denies SOB or any other complaint. PT O2 99% on 2 L nasal cannula at [facility] ER.   Per EMS tested COVID positive 08/05/2019 at [facility].

## 2019-08-08 NOTE — Discharge Instructions (Addendum)
His oxygen level is normal, on room air.  Continue supportive care with offering food and drink frequently.  Use Tylenol if needed for fever.  Have him follow-up with the facility physician for ongoing management, as needed.

## 2019-08-08 NOTE — ED Notes (Addendum)
Per pt's daughter, pt had covid vaccine on Jan 11th at the [facility].  Pt tested positive for covid on the 18th.    Pt having no symptoms and no complaints

## 2019-08-08 NOTE — ED Notes (Signed)
Family updated as to patient's status.

## 2019-08-08 NOTE — ED Provider Notes (Signed)
[facility] EMERGENCY DEPARTMENT Provider Note   CSN: [id number] Arrival date & time: 08/08/19  1844     History Chief Complaint  Patient presents with  . Shortness of Breath    Phillip Nolan is a 86 y.o. male.  HPI  Patient presents for evaluation of hypoxia with known Covid positive status.  Patient had routine testing done, 3 days ago which was indicative of Covid infection by report from his nursing home.  Apparently, 10 days ago, he had his first Covid vaccine.  He is unable to give any history.  Level 5 caveat-dementia     Past Medical History:  Diagnosis Date  . Aortic valve disease    Status post AVR 2011  . Atrial fibrillation ([place])   . BPH (benign prostatic hyperplasia)   . CHF (congestive heart failure) ([place])   . Coronary atherosclerosis of native coronary artery    Nonobstructive  . Dementia ([place])   . Essential hypertension   . History of bacteremia    Streptococcus gordonii bacteremia 2/11 - no clear vegetation by TEE  . HOH (hard of hearing)   . Mitral valve disease    Status post annular repair 2011    Patient Active Problem List   Diagnosis Date Noted  . Sepsis due to undetermined organism ([place]) 03/13/2019  . Lobar pneumonia ([place]) 03/13/2019  . Thrombocytopenia ([place]) 03/13/2019  . Lower urinary tract infection   . Pressure injury of skin 03/12/2019  . Closed fracture of left hip ([place])   . Goals of care, counseling/discussion   . Advanced care planning/counseling discussion   . Palliative care by specialist   . Sepsis ([place]) 03/11/2019  . Acute lower UTI 03/11/2019  . ARF (acute renal failure) ([place]) 03/11/2019  . HCAP (healthcare-associated pneumonia) 03/11/2019  . Acute metabolic encephalopathy 40/98/1191  . Confusion 02/26/2019  . Hematuria 02/26/2019  . Dementia ([place])   . HOH (hard of hearing)   . Mitral valve disease   . BPH (benign prostatic hyperplasia)   . Bradycardia from beta blocker   . Hallucinations   . Secondary  hypercoagulable state ([place])   . Encounter for therapeutic drug monitoring 08/27/2013  . Long term (current) use of anticoagulants 11/30/2010  . CORONARY ATHEROSCLEROSIS NATIVE CORONARY ARTERY 01/01/2010  . MITRAL VALVE DISORDER 01/01/2010  . Aortic valve disorder 01/01/2010  . Essential hypertension, benign 09/11/2009  . Chronic atrial fibrillation ([place]) 09/11/2009    Past Surgical History:  Procedure Laterality Date  . INTRAMEDULLARY (IM) NAIL INTERTROCHANTERIC Left 03/15/2019   Procedure: INTRAMEDULLARY (IM) NAIL INTERTROCHANTRIC;  Surgeon: Rod Can, MD;  Location: [place];  Service: Orthopedics;  Laterality: Left;  . Median sterntomy  4/11   Dr. Roxy Manns - AVR (27 mm Edwards pericardial) and MV repair  . MULTIPLE TOOTH EXTRACTIONS  4/11   (pre-op above)       Family History  Family history unknown: Yes    Social History   Tobacco Use  . Smoking status: Former Smoker    Types: Pipe, Cigars    Quit date: 07/18/1978    Years since quitting: 41.0  . Smokeless tobacco: Never Used  Substance Use Topics  . Alcohol use: No    Alcohol/week: 0.0 standard drinks  . Drug use: No    Home Medications Prior to Admission medications   Medication Sig Start Date End Date Taking? Authorizing Provider  acetaminophen (TYLENOL) 325 MG tablet Take 2 tablets (650 mg total) by mouth every 4 (four) hours as needed for headache  or mild pain. 02/28/19  Yes Emokpae, Courage, MD  OLANZapine (ZYPREXA) 5 MG tablet Take 2.5 mg by mouth at bedtime.   Yes [provider]  warfarin (COUMADIN) 5 MG tablet Take 1.5 tablets (7.5 mg total) by mouth every morning. TAKE 1&1/2 TABLETS DAILY or as directed Patient taking differently: Take 7.5 mg by mouth every evening.  03/01/19  Yes Emokpae, Courage, MD    Allergies    Ciprofloxacin and Penicillins  Review of Systems   Review of Systems  Unable to perform ROS: Dementia    Physical Exam Updated Vital Signs Pulse (!) 57   Temp 98.3 F (36.8  C) (Oral)   Resp (!) 21   SpO2 95%   Physical Exam Vitals and nursing note reviewed.  Constitutional:      Appearance: He is well-developed.  HENT:     Head: Normocephalic and atraumatic.     Right Ear: External ear normal.     Left Ear: External ear normal.     Mouth/Throat:     Mouth: Mucous membranes are dry.  Eyes:     Conjunctiva/sclera: Conjunctivae normal.     Pupils: Pupils are equal, round, and reactive to light.  Neck:     Trachea: Phonation normal.  Cardiovascular:     Rate and Rhythm: Normal rate.     Comments: Normal left radial pulse. Pulmonary:     Effort: Pulmonary effort is normal. No respiratory distress.     Breath sounds: No stridor.     Comments: I removed patient's oxygen while I was with him and his oxygen level stayed at 96% on room air. Abdominal:     General: There is no distension.     Palpations: Abdomen is soft.     Tenderness: There is no abdominal tenderness.  Musculoskeletal:        General: Normal range of motion.     Cervical back: Normal range of motion and neck supple.  Skin:    General: Skin is warm and dry.  Neurological:     Mental Status: He is alert.     Cranial Nerves: No cranial nerve deficit.     Motor: No abnormal muscle tone.     Comments: Normal tone in arms and legs bilaterally.  Patient is nonverbal.  Psychiatric:     Comments: Lethargic     ED Results / Procedures / Treatments   Labs (all labs ordered are listed, but only abnormal results are displayed) Labs Reviewed  COMPREHENSIVE METABOLIC PANEL - Abnormal; Notable for the following components:      Result Value   Sodium 148 (*)    Glucose, Bld 120 (*)    BUN 53 (*)    Creatinine, Ser 1.34 (*)    Albumin 3.1 (*)    GFR calc non Af Amer 47 (*)    GFR calc Af Amer 54 (*)    All other components within normal limits  CBC WITH DIFFERENTIAL/PLATELET - Abnormal; Notable for the following components:   RDW 16.5 (*)    Platelets 85 (*)    All other components  within normal limits  D-DIMER, QUANTITATIVE (NOT AT [facility]) - Abnormal; Notable for the following components:   D-Dimer, Quant 1.64 (*)    All other components within normal limits  FERRITIN - Abnormal; Notable for the following components:   Ferritin 1,198 (*)    All other components within normal limits  FIBRINOGEN - Abnormal; Notable for the following components:   Fibrinogen 577 (*)  All other components within normal limits  LACTATE DEHYDROGENASE - Abnormal; Notable for the following components:   LDH 219 (*)    All other components within normal limits  TROPONIN I (HIGH SENSITIVITY) - Abnormal; Notable for the following components:   Troponin I (High Sensitivity) 80 (*)    All other components within normal limits  PROCALCITONIN  ABO/RH  TROPONIN I (HIGH SENSITIVITY)    EKG EKG Interpretation  Date/Time:  Thursday August 08 2019 19:04:08 EST Ventricular Rate:  65 PR Interval:    QRS Duration: 176 QT Interval:  473 QTC Calculation: 492 R Axis:   -58 Text Interpretation: Atrial fibrillation Ventricular premature complex RBBB and LAFB Since last tracing P wave not visible, Atrial Fubrillation has reoccured Otherwise no significant change Confirmed by Mancel Bale [id number]) on 08/08/2019 7:08:44 PM   Radiology DG Chest Port 1 View  Result Date: 08/08/2019 CLINICAL DATA:  86 year old male with shortness of breath. EXAM: PORTABLE CHEST 1 VIEW COMPARISON:  Chest radiograph dated 03/13/2019. FINDINGS: There is cardiomegaly with mild vascular congestion. No focal consolidation, large pleural effusion, or pneumothorax. Atherosclerotic calcification of the aorta. Median sternotomy wires and mechanical cardiac valve. No acute osseous pathology. IMPRESSION: Cardiomegaly vascular congestion.  No focal consolidation. Electronically Signed   By: Elgie Collard M.D.   On: 08/08/2019 19:58    Procedures Procedures (including critical care time)  Medications Ordered in ED Medications  - No data to display  ED Course  I have reviewed the triage vital signs and the nursing notes.  Pertinent labs & imaging results that were available during my care of the patient were reviewed by me and considered in my medical decision making (see chart for details).  Clinical Course as of Aug 11 905  Thu Aug 08, 2019  2031 No infiltrate or CHF, interpreted by me  DG Chest [facility] [EW]  2259 Normal  Procalcitonin [EW]  2259 Elevated  Lactate dehydrogenase(!) [EW]  2259 Elevated   [EW]  2259 Elevated  D-dimer, quantitative (not at [facility])(!) [EW]  2259 Normal  CBC with Differential/Platelet(!) [EW]  2259 Elevated  Ferritin(!) [EW]  2259 Elevated  Troponin I (High Sensitivity)(!) [EW]  2259 Normal except sodium high, glucose high, BUN high, creatinine high, albumin low  Comprehensive metabolic panel(!) [EW]  2324 Patient continues to sat normally on room air, without respiratory distress.  I had a long discussion with the patient's daughter, regarding his current status and plans for discharge back to his facility.  She is in agreement.  She will contact the facility in the morning to check on him.   [EW]  2336 Elevated, unchanged delta troponin  Troponin I (High Sensitivity)(!) [EW]    Clinical Course User Index [EW] Mancel Bale, MD   MDM Rules/Calculators/A&P                       Patient Vitals for the past 24 hrs:  Temp Temp src Pulse Resp SpO2  08/08/19 2130 -- -- (!) 57 (!) 21 95 %  08/08/19 2033 98.3 F (36.8 C) Oral -- -- --  08/08/19 2030 -- -- -- 20 --  08/08/19 1919 -- -- (!) 58 20 97 %  08/08/19 1915 -- -- (!) 59 16 97 %    At discharge -reevaluation with update and discussion. After initial assessment and treatment, an updated evaluation reveals no change in status, with reassuring vital signs.Mancel Bale   Medical Decision Making: COVID-19 infection, with transient  hypoxia, improved on evaluation.  Doubt serious bacterial infection, metabolic  instability or impending vascular collapse.  No indication for hospitalization, or advanced airway at this time.  Sabastian Rashun Grattan was evaluated in Emergency Department on 08/12/2019 for the symptoms described in the history of present illness. He was evaluated in the context of the global COVID-19 pandemic, which necessitated consideration that the patient might be at risk for infection with the SARS-CoV-2 virus that causes COVID-19. Institutional protocols and algorithms that pertain to the evaluation of patients at risk for COVID-19 are in a state of rapid change based on information released by regulatory bodies including the CDC and federal and state organizations. These policies and algorithms were followed during the patient's care in the ED.   CRITICAL CARE-no Performed by: Mancel Bale  Nursing Notes Reviewed/ Care Coordinated Applicable Imaging Reviewed Interpretation of Laboratory Data incorporated into ED treatment  The patient appears reasonably screened and/or stabilized for discharge and I doubt any other medical condition or other [facility] requiring further screening, evaluation, or treatment in the ED at this time prior to discharge.  Plan: Home Medications-continue routine medications; Home Treatments-courage advance diet and activity; return here if the recommended treatment, does not improve the symptoms; Recommended follow up-PCP As needed.   Final Clinical Impression(s) / ED Diagnoses Final diagnoses:  None    Rx / DC Orders ED Discharge Orders    None       Mancel Bale, MD 08/12/19 [phone]

## 2019-08-10 DIAGNOSIS — I482 Chronic atrial fibrillation, unspecified: Secondary | ICD-10-CM | POA: Diagnosis not present

## 2019-08-10 DIAGNOSIS — N390 Urinary tract infection, site not specified: Secondary | ICD-10-CM | POA: Diagnosis not present

## 2019-08-10 DIAGNOSIS — U071 COVID-19: Secondary | ICD-10-CM | POA: Diagnosis not present

## 2019-08-10 DIAGNOSIS — E860 Dehydration: Secondary | ICD-10-CM | POA: Diagnosis not present

## 2019-08-10 DIAGNOSIS — I5022 Chronic systolic (congestive) heart failure: Secondary | ICD-10-CM | POA: Diagnosis not present

## 2019-08-10 DIAGNOSIS — R748 Abnormal levels of other serum enzymes: Secondary | ICD-10-CM | POA: Diagnosis not present

## 2019-08-10 DIAGNOSIS — Z1639 Resistance to other specified antimicrobial drug: Secondary | ICD-10-CM | POA: Diagnosis not present

## 2019-08-10 DIAGNOSIS — E870 Hyperosmolality and hypernatremia: Secondary | ICD-10-CM | POA: Diagnosis not present

## 2019-08-10 DIAGNOSIS — I248 Other forms of acute ischemic heart disease: Secondary | ICD-10-CM | POA: Diagnosis not present

## 2019-08-10 DIAGNOSIS — J1282 Pneumonia due to coronavirus disease 2019: Secondary | ICD-10-CM | POA: Diagnosis not present

## 2019-08-10 DIAGNOSIS — Z0289 Encounter for other administrative examinations: Secondary | ICD-10-CM | POA: Diagnosis not present

## 2019-08-11 DIAGNOSIS — E860 Dehydration: Secondary | ICD-10-CM | POA: Diagnosis present

## 2019-08-11 DIAGNOSIS — Z881 Allergy status to other antibiotic agents status: Secondary | ICD-10-CM | POA: Diagnosis not present

## 2019-08-11 DIAGNOSIS — Z431 Encounter for attention to gastrostomy: Secondary | ICD-10-CM | POA: Diagnosis not present

## 2019-08-11 DIAGNOSIS — Z7401 Bed confinement status: Secondary | ICD-10-CM | POA: Diagnosis not present

## 2019-08-11 DIAGNOSIS — I248 Other forms of acute ischemic heart disease: Secondary | ICD-10-CM | POA: Diagnosis present

## 2019-08-11 DIAGNOSIS — R7989 Other specified abnormal findings of blood chemistry: Secondary | ICD-10-CM | POA: Diagnosis not present

## 2019-08-11 DIAGNOSIS — R739 Hyperglycemia, unspecified: Secondary | ICD-10-CM | POA: Diagnosis present

## 2019-08-11 DIAGNOSIS — Z880 Allergy status to penicillin: Secondary | ICD-10-CM | POA: Diagnosis not present

## 2019-08-11 DIAGNOSIS — L89620 Pressure ulcer of left heel, unstageable: Secondary | ICD-10-CM | POA: Diagnosis present

## 2019-08-11 DIAGNOSIS — M2550 Pain in unspecified joint: Secondary | ICD-10-CM | POA: Diagnosis not present

## 2019-08-11 DIAGNOSIS — N390 Urinary tract infection, site not specified: Secondary | ICD-10-CM | POA: Diagnosis present

## 2019-08-11 DIAGNOSIS — I110 Hypertensive heart disease with heart failure: Secondary | ICD-10-CM | POA: Diagnosis present

## 2019-08-11 DIAGNOSIS — Z951 Presence of aortocoronary bypass graft: Secondary | ICD-10-CM | POA: Diagnosis not present

## 2019-08-11 DIAGNOSIS — J189 Pneumonia, unspecified organism: Secondary | ICD-10-CM | POA: Diagnosis not present

## 2019-08-11 DIAGNOSIS — B9689 Other specified bacterial agents as the cause of diseases classified elsewhere: Secondary | ICD-10-CM | POA: Diagnosis present

## 2019-08-11 DIAGNOSIS — E878 Other disorders of electrolyte and fluid balance, not elsewhere classified: Secondary | ICD-10-CM | POA: Diagnosis present

## 2019-08-11 DIAGNOSIS — Z1639 Resistance to other specified antimicrobial drug: Secondary | ICD-10-CM | POA: Diagnosis present

## 2019-08-11 DIAGNOSIS — R791 Abnormal coagulation profile: Secondary | ICD-10-CM | POA: Diagnosis present

## 2019-08-11 DIAGNOSIS — L89609 Pressure ulcer of unspecified heel, unspecified stage: Secondary | ICD-10-CM | POA: Diagnosis not present

## 2019-08-11 DIAGNOSIS — I351 Nonrheumatic aortic (valve) insufficiency: Secondary | ICD-10-CM | POA: Diagnosis not present

## 2019-08-11 DIAGNOSIS — U071 COVID-19: Secondary | ICD-10-CM | POA: Diagnosis present

## 2019-08-11 DIAGNOSIS — R0902 Hypoxemia: Secondary | ICD-10-CM | POA: Diagnosis not present

## 2019-08-11 DIAGNOSIS — I482 Chronic atrial fibrillation, unspecified: Secondary | ICD-10-CM | POA: Diagnosis present

## 2019-08-11 DIAGNOSIS — D696 Thrombocytopenia, unspecified: Secondary | ICD-10-CM | POA: Diagnosis present

## 2019-08-11 DIAGNOSIS — J1282 Pneumonia due to coronavirus disease 2019: Secondary | ICD-10-CM | POA: Diagnosis present

## 2019-08-11 DIAGNOSIS — F0390 Unspecified dementia without behavioral disturbance: Secondary | ICD-10-CM | POA: Diagnosis present

## 2019-08-11 DIAGNOSIS — E870 Hyperosmolality and hypernatremia: Secondary | ICD-10-CM | POA: Diagnosis present

## 2019-08-11 DIAGNOSIS — R748 Abnormal levels of other serum enzymes: Secondary | ICD-10-CM | POA: Diagnosis not present

## 2019-08-11 DIAGNOSIS — E861 Hypovolemia: Secondary | ICD-10-CM | POA: Diagnosis present

## 2019-08-11 DIAGNOSIS — Z7901 Long term (current) use of anticoagulants: Secondary | ICD-10-CM | POA: Diagnosis not present

## 2019-08-11 DIAGNOSIS — R269 Unspecified abnormalities of gait and mobility: Secondary | ICD-10-CM | POA: Diagnosis present

## 2019-08-11 DIAGNOSIS — I2510 Atherosclerotic heart disease of native coronary artery without angina pectoris: Secondary | ICD-10-CM | POA: Diagnosis present

## 2019-08-11 DIAGNOSIS — D649 Anemia, unspecified: Secondary | ICD-10-CM | POA: Diagnosis present

## 2019-08-11 DIAGNOSIS — I5022 Chronic systolic (congestive) heart failure: Secondary | ICD-10-CM | POA: Diagnosis present

## 2019-08-16 DIAGNOSIS — E46 Unspecified protein-calorie malnutrition: Secondary | ICD-10-CM | POA: Diagnosis not present

## 2019-08-16 DIAGNOSIS — N390 Urinary tract infection, site not specified: Secondary | ICD-10-CM | POA: Diagnosis not present

## 2019-08-16 DIAGNOSIS — E860 Dehydration: Secondary | ICD-10-CM | POA: Diagnosis not present

## 2019-08-16 DIAGNOSIS — U071 COVID-19: Secondary | ICD-10-CM | POA: Diagnosis not present

## 2019-08-21 DIAGNOSIS — L89614 Pressure ulcer of right heel, stage 4: Secondary | ICD-10-CM | POA: Diagnosis not present

## 2019-08-21 DIAGNOSIS — L89153 Pressure ulcer of sacral region, stage 3: Secondary | ICD-10-CM | POA: Diagnosis not present

## 2019-08-21 DIAGNOSIS — L89150 Pressure ulcer of sacral region, unstageable: Secondary | ICD-10-CM | POA: Diagnosis not present

## 2019-08-22 DIAGNOSIS — Z7901 Long term (current) use of anticoagulants: Secondary | ICD-10-CM | POA: Diagnosis not present

## 2019-08-28 DIAGNOSIS — L89624 Pressure ulcer of left heel, stage 4: Secondary | ICD-10-CM | POA: Diagnosis not present

## 2019-08-28 DIAGNOSIS — L89614 Pressure ulcer of right heel, stage 4: Secondary | ICD-10-CM | POA: Diagnosis not present

## 2019-08-28 DIAGNOSIS — L89153 Pressure ulcer of sacral region, stage 3: Secondary | ICD-10-CM | POA: Diagnosis not present

## 2019-09-04 DIAGNOSIS — L89624 Pressure ulcer of left heel, stage 4: Secondary | ICD-10-CM | POA: Diagnosis not present

## 2019-09-04 DIAGNOSIS — L89614 Pressure ulcer of right heel, stage 4: Secondary | ICD-10-CM | POA: Diagnosis not present

## 2019-09-11 DIAGNOSIS — L89614 Pressure ulcer of right heel, stage 4: Secondary | ICD-10-CM | POA: Diagnosis not present

## 2019-09-11 DIAGNOSIS — L89153 Pressure ulcer of sacral region, stage 3: Secondary | ICD-10-CM | POA: Diagnosis not present

## 2019-09-11 DIAGNOSIS — L89893 Pressure ulcer of other site, stage 3: Secondary | ICD-10-CM | POA: Diagnosis not present

## 2019-09-13 DIAGNOSIS — E46 Unspecified protein-calorie malnutrition: Secondary | ICD-10-CM | POA: Diagnosis not present

## 2019-09-13 DIAGNOSIS — I482 Chronic atrial fibrillation, unspecified: Secondary | ICD-10-CM | POA: Diagnosis not present

## 2019-09-13 DIAGNOSIS — D649 Anemia, unspecified: Secondary | ICD-10-CM | POA: Diagnosis not present

## 2019-09-13 DIAGNOSIS — R1310 Dysphagia, unspecified: Secondary | ICD-10-CM | POA: Diagnosis not present

## 2019-09-16 DIAGNOSIS — F0391 Unspecified dementia with behavioral disturbance: Secondary | ICD-10-CM | POA: Diagnosis not present

## 2019-09-16 DIAGNOSIS — R4182 Altered mental status, unspecified: Secondary | ICD-10-CM | POA: Diagnosis not present

## 2019-09-18 DIAGNOSIS — L89614 Pressure ulcer of right heel, stage 4: Secondary | ICD-10-CM | POA: Diagnosis not present

## 2019-09-18 DIAGNOSIS — L89624 Pressure ulcer of left heel, stage 4: Secondary | ICD-10-CM | POA: Diagnosis not present

## 2019-09-21 DIAGNOSIS — N400 Enlarged prostate without lower urinary tract symptoms: Secondary | ICD-10-CM | POA: Diagnosis not present

## 2019-09-21 DIAGNOSIS — I2510 Atherosclerotic heart disease of native coronary artery without angina pectoris: Secondary | ICD-10-CM | POA: Diagnosis not present

## 2019-09-21 DIAGNOSIS — I10 Essential (primary) hypertension: Secondary | ICD-10-CM | POA: Diagnosis not present

## 2019-09-21 DIAGNOSIS — I110 Hypertensive heart disease with heart failure: Secondary | ICD-10-CM | POA: Diagnosis not present

## 2019-10-01 DIAGNOSIS — R8290 Unspecified abnormal findings in urine: Secondary | ICD-10-CM | POA: Diagnosis not present

## 2019-10-01 DIAGNOSIS — R82998 Other abnormal findings in urine: Secondary | ICD-10-CM | POA: Diagnosis not present

## 2019-10-03 ENCOUNTER — Ambulatory Visit: Payer: Self-pay | Admitting: *Deleted

## 2019-10-15 DIAGNOSIS — I482 Chronic atrial fibrillation, unspecified: Secondary | ICD-10-CM | POA: Diagnosis not present

## 2019-10-15 DIAGNOSIS — R627 Adult failure to thrive: Secondary | ICD-10-CM | POA: Diagnosis not present

## 2019-10-15 DIAGNOSIS — E46 Unspecified protein-calorie malnutrition: Secondary | ICD-10-CM | POA: Diagnosis not present

## 2019-10-15 DIAGNOSIS — R1310 Dysphagia, unspecified: Secondary | ICD-10-CM | POA: Diagnosis not present

## 2019-10-23 DIAGNOSIS — L89614 Pressure ulcer of right heel, stage 4: Secondary | ICD-10-CM | POA: Diagnosis not present

## 2019-10-26 DIAGNOSIS — I110 Hypertensive heart disease with heart failure: Secondary | ICD-10-CM | POA: Diagnosis not present

## 2019-10-26 DIAGNOSIS — N400 Enlarged prostate without lower urinary tract symptoms: Secondary | ICD-10-CM | POA: Diagnosis not present

## 2019-10-26 DIAGNOSIS — I10 Essential (primary) hypertension: Secondary | ICD-10-CM | POA: Diagnosis not present

## 2019-10-26 DIAGNOSIS — I2510 Atherosclerotic heart disease of native coronary artery without angina pectoris: Secondary | ICD-10-CM | POA: Diagnosis not present

## 2019-11-21 DIAGNOSIS — D649 Anemia, unspecified: Secondary | ICD-10-CM | POA: Diagnosis not present

## 2019-11-21 DIAGNOSIS — N400 Enlarged prostate without lower urinary tract symptoms: Secondary | ICD-10-CM | POA: Diagnosis not present

## 2019-11-21 DIAGNOSIS — F0390 Unspecified dementia without behavioral disturbance: Secondary | ICD-10-CM | POA: Diagnosis not present

## 2019-11-21 DIAGNOSIS — I2510 Atherosclerotic heart disease of native coronary artery without angina pectoris: Secondary | ICD-10-CM | POA: Diagnosis not present

## 2019-11-25 ENCOUNTER — Telehealth (INDEPENDENT_AMBULATORY_CARE_PROVIDER_SITE_OTHER): Payer: Medicare Other | Admitting: Cardiology

## 2019-11-25 ENCOUNTER — Encounter: Payer: Self-pay | Admitting: Cardiology

## 2019-11-25 ENCOUNTER — Telehealth: Payer: Self-pay | Admitting: *Deleted

## 2019-11-25 VITALS — BP 124/74 | HR 74 | Ht 71.0 in | Wt 149.6 lb

## 2019-11-25 DIAGNOSIS — I4821 Permanent atrial fibrillation: Secondary | ICD-10-CM

## 2019-11-25 DIAGNOSIS — Z953 Presence of xenogenic heart valve: Secondary | ICD-10-CM

## 2019-11-25 NOTE — Telephone Encounter (Signed)
Patient verbally consented for telehealth visits with [facility] and understands that his insurance company will be billed for the encounter.   Aware to have vitals available 

## 2019-11-25 NOTE — Patient Instructions (Signed)
Medication Instructions:   Your physician recommends that you continue on your current medications as directed. Please refer to the Current Medication list given to you today.  Labwork:  NONE  Testing/Procedures:  NONE  Follow-Up:  Your physician recommends that you schedule a follow-up appointment in: 6 months (virtual-phone). You will receive a reminder letter in the mail in about 30months reminding you to call and schedule your appointment. If you don't receive this letter, please contact our office.  Any Other Special Instructions Will Be Listed Below (If Applicable).  If you need a refill on your cardiac medications before your next appointment, please call your pharmacy.

## 2019-11-25 NOTE — Progress Notes (Signed)
Virtual Visit via Telephone Note   This visit type was conducted due to national recommendations for restrictions regarding the COVID-19 Pandemic (e.g. social distancing) in an effort to limit this patient's exposure and mitigate transmission in our community.  Due to his co-morbid illnesses, this patient is at least at moderate risk for complications without adequate follow up.  This format is felt to be most appropriate for this patient at this time.  The patient did not have access to video technology/had technical difficulties with video requiring transitioning to audio format only (telephone).  All issues noted in this document were discussed and addressed.  No physical exam could be performed with this format.  Please refer to the patient's chart for his  consent to telehealth for [facility].   The patient was identified using 2 identifiers.  Date:  11/25/2019   ID:  Maia Petties, DOB 10/24/1927, MRN [id number]  Patient Location: [place] Provider Location: Office  PCP:  Maryruth Hancock, MD  Cardiologist:  Rozann Lesches, MD Electrophysiologist:  None   Evaluation Performed:  Follow-Up Visit  Chief Complaint:   Cardiac follow-up  History of Present Illness:    Phillip Nolan is a 86 y.o. male last seen in October 2020.  I spoke with the patient's nurse Collie Siad at the [facility] today.  We reviewed his medications and recent vital signs.  He remains confused at baseline and not oriented, fairly sedentary.  No reported recent falls or bleeding episodes.  He remains on Coumadin.  This is being followed and managed currently by Ms. Sydell Axon NP with [facility].  He has had supratherapeutic INR is resulting in adjustments recently.  He is not on any other cardiac medications at this time, has not required AV nodal blockers for control of atrial arrhythmias with underlying conduction system disease.   Past Medical History:  Diagnosis Date  . Aortic  valve disease    Status post AVR 2011  . Atrial fibrillation ([place])   . BPH (benign prostatic hyperplasia)   . CHF (congestive heart failure) ([place])   . Coronary atherosclerosis of native coronary artery    Nonobstructive  . Dementia ([place])   . Essential hypertension   . History of bacteremia    Streptococcus gordonii bacteremia 2/11 - no clear vegetation by TEE  . HOH (hard of hearing)   . Mitral valve disease    Status post annular repair 2011   Past Surgical History:  Procedure Laterality Date  . INTRAMEDULLARY (IM) NAIL INTERTROCHANTERIC Left 03/15/2019   Procedure: INTRAMEDULLARY (IM) NAIL INTERTROCHANTRIC;  Surgeon: Rod Can, MD;  Location: [place];  Service: Orthopedics;  Laterality: Left;  . Median sterntomy  4/11   Dr. Roxy Manns - AVR (27 mm Edwards pericardial) and MV repair  . MULTIPLE TOOTH EXTRACTIONS  4/11   (pre-op above)     Current Meds  Medication Sig  . acetaminophen (TYLENOL) 325 MG tablet Take 2 tablets (650 mg total) by mouth every 4 (four) hours as needed for headache or mild pain.  . Ascorbic Acid (VITAMIN C WITH ROSE HIPS) 500 MG tablet Take 500 mg by mouth daily.  Marland Kitchen levothyroxine (SYNTHROID) 50 MCG tablet Take 50 mcg by mouth daily before breakfast.  . Multiple Vitamin (MULTIVITAMIN) tablet Take 1 tablet by mouth daily.  Marland Kitchen OLANZapine (ZYPREXA) 5 MG tablet Take 2.5 mg by mouth at bedtime.  Marland Kitchen warfarin (COUMADIN) 5 MG tablet Take 1.5 tablets (7.5 mg total) by mouth every morning.  TAKE 1&1/2 TABLETS DAILY or as directed (Patient taking differently: Take 7.5 mg by mouth every evening. )     Allergies:   Ciprofloxacin and Penicillins   ROS:  Unable to obtain.    Prior CV studies:   The following studies were reviewed today:  Echocardiogram 02/27/2019: 1. The left ventricle has mildly reduced systolic function, with an ejection fraction of 45-50%. The cavity size was normal. Severe basal septal hypertrophy. Otherwise, mild LVH of remaining myocardium. Left  ventricular diastolic function could not be  evaluated secondary to atrial fibrillation. Left ventricular diffuse hypokinesis. 2. Trivial pericardial effusion is present. 3. The tricuspid valve is grossly normal. 4. A 27 mm Edwards pericardial tissue valve is present in the AV position. Poorly visualized. 5. The aorta is abnormal in size and structure. 6. There is mild dilatation of the aortic root measuring 42 mm. 7. Left atrial size was severely dilated. 8. Right atrial size was mildly dilated. 9. A 36 mm Sorin memo 3D annuloplasty ring is present at the MV anulus.  Labs/Other Tests and Data Reviewed:    EKG:  An ECG dated 08/08/2019 was personally reviewed today and demonstrated:  Probable atrial fibrillation with right bundle branch block and left anterior fascicular block.  Recent Labs: 02/17/2019: TSH 2.140 02/26/2019: B Natriuretic Peptide 130.0 03/14/2019: Magnesium 2.4 08/08/2019: ALT 17; BUN 53; Creatinine, Ser 1.34; Hemoglobin 14.7; Platelets 85; Potassium 4.2; Sodium 148   Wt Readings from Last 3 Encounters:  11/25/19 149 lb 9.6 oz (67.9 kg)  03/15/19 198 lb 6.6 oz (90 kg)  03/04/19 183 lb 3.2 oz (83.1 kg)     Objective:    Vital Signs:  BP 124/74   Pulse 74   Ht 5\' 11"  (1.803 m)   Wt 149 lb 9.6 oz (67.9 kg)   BMI 20.86 kg/m    I spoke with the patient's nurse during the entire encounter.  ASSESSMENT & PLAN:    1.  Secondary hypercoagulable state, on Coumadin with follow-up by provider at the [facility], discussed above.  2.  Permanent atrial fibrillation/flutter.  Heart rate normal range today, he has not required AV nodal blockers in the setting of underlying conduction system disease.  3.  Aortic stenosis status post bioprosthetic AVR in 2011.   Time:   Today, I have spent 6 minutes with the patient with telehealth technology discussing the above problems.     Medication Adjustments/Labs and Tests Ordered: Current medicines are reviewed at  length with the patient today.  Concerns regarding medicines are outlined above.   Tests Ordered: No orders of the defined types were placed in this encounter.   Medication Changes: No orders of the defined types were placed in this encounter.   Follow Up:  Virtual Visit  6 months.  Signed, 2012, MD  11/25/2019 9:55 AM     [facility]

## 2019-12-17 DIAGNOSIS — I4891 Unspecified atrial fibrillation: Secondary | ICD-10-CM | POA: Diagnosis not present

## 2019-12-17 DIAGNOSIS — I2510 Atherosclerotic heart disease of native coronary artery without angina pectoris: Secondary | ICD-10-CM | POA: Diagnosis not present

## 2019-12-17 DIAGNOSIS — G4700 Insomnia, unspecified: Secondary | ICD-10-CM | POA: Diagnosis not present

## 2019-12-17 DIAGNOSIS — E039 Hypothyroidism, unspecified: Secondary | ICD-10-CM | POA: Diagnosis not present

## 2019-12-20 DIAGNOSIS — L309 Dermatitis, unspecified: Secondary | ICD-10-CM | POA: Diagnosis not present

## 2020-01-28 DIAGNOSIS — R1900 Intra-abdominal and pelvic swelling, mass and lump, unspecified site: Secondary | ICD-10-CM | POA: Diagnosis not present

## 2020-02-06 DIAGNOSIS — Z881 Allergy status to other antibiotic agents status: Secondary | ICD-10-CM | POA: Diagnosis not present

## 2020-02-06 DIAGNOSIS — R638 Other symptoms and signs concerning food and fluid intake: Secondary | ICD-10-CM | POA: Diagnosis not present

## 2020-02-06 DIAGNOSIS — Z431 Encounter for attention to gastrostomy: Secondary | ICD-10-CM | POA: Diagnosis not present

## 2020-02-06 DIAGNOSIS — R1310 Dysphagia, unspecified: Secondary | ICD-10-CM | POA: Diagnosis present

## 2020-02-06 DIAGNOSIS — N390 Urinary tract infection, site not specified: Secondary | ICD-10-CM | POA: Diagnosis present

## 2020-02-06 DIAGNOSIS — I10 Essential (primary) hypertension: Secondary | ICD-10-CM | POA: Diagnosis not present

## 2020-02-06 DIAGNOSIS — Z87891 Personal history of nicotine dependence: Secondary | ICD-10-CM | POA: Diagnosis not present

## 2020-02-06 DIAGNOSIS — J9601 Acute respiratory failure with hypoxia: Secondary | ICD-10-CM | POA: Diagnosis present

## 2020-02-06 DIAGNOSIS — L97429 Non-pressure chronic ulcer of left heel and midfoot with unspecified severity: Secondary | ICD-10-CM | POA: Diagnosis present

## 2020-02-06 DIAGNOSIS — Z87440 Personal history of urinary (tract) infections: Secondary | ICD-10-CM | POA: Diagnosis not present

## 2020-02-06 DIAGNOSIS — R404 Transient alteration of awareness: Secondary | ICD-10-CM | POA: Diagnosis not present

## 2020-02-06 DIAGNOSIS — R001 Bradycardia, unspecified: Secondary | ICD-10-CM | POA: Diagnosis not present

## 2020-02-06 DIAGNOSIS — E440 Moderate protein-calorie malnutrition: Secondary | ICD-10-CM | POA: Diagnosis present

## 2020-02-06 DIAGNOSIS — I2510 Atherosclerotic heart disease of native coronary artery without angina pectoris: Secondary | ICD-10-CM | POA: Diagnosis present

## 2020-02-06 DIAGNOSIS — Z7901 Long term (current) use of anticoagulants: Secondary | ICD-10-CM | POA: Diagnosis not present

## 2020-02-06 DIAGNOSIS — N179 Acute kidney failure, unspecified: Secondary | ICD-10-CM | POA: Diagnosis present

## 2020-02-06 DIAGNOSIS — Z7401 Bed confinement status: Secondary | ICD-10-CM | POA: Diagnosis not present

## 2020-02-06 DIAGNOSIS — E870 Hyperosmolality and hypernatremia: Secondary | ICD-10-CM | POA: Diagnosis present

## 2020-02-06 DIAGNOSIS — R0902 Hypoxemia: Secondary | ICD-10-CM | POA: Diagnosis not present

## 2020-02-06 DIAGNOSIS — Z66 Do not resuscitate: Secondary | ICD-10-CM | POA: Diagnosis not present

## 2020-02-06 DIAGNOSIS — F0390 Unspecified dementia without behavioral disturbance: Secondary | ICD-10-CM | POA: Diagnosis present

## 2020-02-06 DIAGNOSIS — E039 Hypothyroidism, unspecified: Secondary | ICD-10-CM | POA: Diagnosis present

## 2020-02-06 DIAGNOSIS — R197 Diarrhea, unspecified: Secondary | ICD-10-CM | POA: Diagnosis not present

## 2020-02-06 DIAGNOSIS — Z952 Presence of prosthetic heart valve: Secondary | ICD-10-CM | POA: Diagnosis not present

## 2020-02-06 DIAGNOSIS — Z8616 Personal history of COVID-19: Secondary | ICD-10-CM | POA: Diagnosis not present

## 2020-02-06 DIAGNOSIS — Z880 Allergy status to penicillin: Secondary | ICD-10-CM | POA: Diagnosis not present

## 2020-02-06 DIAGNOSIS — F0280 Dementia in other diseases classified elsewhere without behavioral disturbance: Secondary | ICD-10-CM | POA: Diagnosis not present

## 2020-02-06 DIAGNOSIS — I5040 Unspecified combined systolic (congestive) and diastolic (congestive) heart failure: Secondary | ICD-10-CM | POA: Diagnosis not present

## 2020-02-06 DIAGNOSIS — E860 Dehydration: Secondary | ICD-10-CM | POA: Diagnosis present

## 2020-02-06 DIAGNOSIS — G9341 Metabolic encephalopathy: Secondary | ICD-10-CM | POA: Diagnosis present

## 2020-02-06 DIAGNOSIS — I110 Hypertensive heart disease with heart failure: Secondary | ICD-10-CM | POA: Diagnosis present

## 2020-02-06 DIAGNOSIS — I5022 Chronic systolic (congestive) heart failure: Secondary | ICD-10-CM | POA: Diagnosis present

## 2020-02-06 DIAGNOSIS — E8881 Metabolic syndrome: Secondary | ICD-10-CM | POA: Diagnosis not present

## 2020-02-06 DIAGNOSIS — I4891 Unspecified atrial fibrillation: Secondary | ICD-10-CM | POA: Diagnosis present

## 2020-02-06 DIAGNOSIS — G311 Senile degeneration of brain, not elsewhere classified: Secondary | ICD-10-CM | POA: Diagnosis not present

## 2020-02-06 DIAGNOSIS — Z515 Encounter for palliative care: Secondary | ICD-10-CM | POA: Diagnosis not present

## 2020-02-06 DIAGNOSIS — Z681 Body mass index (BMI) 19 or less, adult: Secondary | ICD-10-CM | POA: Diagnosis not present

## 2020-02-06 DIAGNOSIS — R791 Abnormal coagulation profile: Secondary | ICD-10-CM | POA: Diagnosis present

## 2020-02-06 DIAGNOSIS — L97419 Non-pressure chronic ulcer of right heel and midfoot with unspecified severity: Secondary | ICD-10-CM | POA: Diagnosis present

## 2020-02-06 DIAGNOSIS — R4182 Altered mental status, unspecified: Secondary | ICD-10-CM | POA: Diagnosis not present

## 2020-02-06 DIAGNOSIS — R748 Abnormal levels of other serum enzymes: Secondary | ICD-10-CM | POA: Diagnosis not present

## 2020-02-13 DIAGNOSIS — I4891 Unspecified atrial fibrillation: Secondary | ICD-10-CM | POA: Diagnosis not present

## 2020-02-13 DIAGNOSIS — E039 Hypothyroidism, unspecified: Secondary | ICD-10-CM | POA: Diagnosis not present

## 2020-02-13 DIAGNOSIS — N390 Urinary tract infection, site not specified: Secondary | ICD-10-CM | POA: Diagnosis not present

## 2020-02-13 DIAGNOSIS — R630 Anorexia: Secondary | ICD-10-CM | POA: Diagnosis not present

## 2020-02-15 DIAGNOSIS — N400 Enlarged prostate without lower urinary tract symptoms: Secondary | ICD-10-CM | POA: Diagnosis not present

## 2020-02-15 DIAGNOSIS — I10 Essential (primary) hypertension: Secondary | ICD-10-CM | POA: Diagnosis not present

## 2020-02-15 DIAGNOSIS — I2510 Atherosclerotic heart disease of native coronary artery without angina pectoris: Secondary | ICD-10-CM | POA: Diagnosis not present

## 2020-02-15 DIAGNOSIS — R269 Unspecified abnormalities of gait and mobility: Secondary | ICD-10-CM | POA: Diagnosis not present

## 2020-02-24 DIAGNOSIS — F0391 Unspecified dementia with behavioral disturbance: Secondary | ICD-10-CM | POA: Diagnosis not present

## 2020-02-24 DIAGNOSIS — F419 Anxiety disorder, unspecified: Secondary | ICD-10-CM | POA: Diagnosis not present

## 2020-02-24 DIAGNOSIS — R4182 Altered mental status, unspecified: Secondary | ICD-10-CM | POA: Diagnosis not present

## 2020-03-03 DIAGNOSIS — E039 Hypothyroidism, unspecified: Secondary | ICD-10-CM | POA: Diagnosis not present

## 2020-03-03 DIAGNOSIS — Z79899 Other long term (current) drug therapy: Secondary | ICD-10-CM | POA: Diagnosis not present

## 2020-03-03 DIAGNOSIS — I4891 Unspecified atrial fibrillation: Secondary | ICD-10-CM | POA: Diagnosis not present

## 2020-03-03 DIAGNOSIS — I110 Hypertensive heart disease with heart failure: Secondary | ICD-10-CM | POA: Diagnosis not present

## 2020-03-03 DIAGNOSIS — I482 Chronic atrial fibrillation, unspecified: Secondary | ICD-10-CM | POA: Diagnosis not present

## 2020-03-13 DIAGNOSIS — I4891 Unspecified atrial fibrillation: Secondary | ICD-10-CM | POA: Diagnosis not present

## 2020-03-13 DIAGNOSIS — I2510 Atherosclerotic heart disease of native coronary artery without angina pectoris: Secondary | ICD-10-CM | POA: Diagnosis not present

## 2020-03-13 DIAGNOSIS — I10 Essential (primary) hypertension: Secondary | ICD-10-CM | POA: Diagnosis not present

## 2020-03-13 DIAGNOSIS — E039 Hypothyroidism, unspecified: Secondary | ICD-10-CM | POA: Diagnosis not present

## 2020-03-14 DIAGNOSIS — I482 Chronic atrial fibrillation, unspecified: Secondary | ICD-10-CM | POA: Diagnosis not present

## 2020-03-14 DIAGNOSIS — I110 Hypertensive heart disease with heart failure: Secondary | ICD-10-CM | POA: Diagnosis not present

## 2020-03-14 DIAGNOSIS — E039 Hypothyroidism, unspecified: Secondary | ICD-10-CM | POA: Diagnosis not present

## 2020-03-14 DIAGNOSIS — E119 Type 2 diabetes mellitus without complications: Secondary | ICD-10-CM | POA: Diagnosis not present

## 2020-03-14 DIAGNOSIS — I4891 Unspecified atrial fibrillation: Secondary | ICD-10-CM | POA: Diagnosis not present

## 2020-03-14 DIAGNOSIS — D649 Anemia, unspecified: Secondary | ICD-10-CM | POA: Diagnosis not present

## 2020-03-14 DIAGNOSIS — Z79899 Other long term (current) drug therapy: Secondary | ICD-10-CM | POA: Diagnosis not present

## 2020-03-24 IMAGING — CR PORTABLE PELVIS 1-2 VIEWS
1 series · 1 of 1 positions shown · non-contrast
Comparison: None.

CLINICAL DATA: 89-year-old presenting after a fall. LEFT hip pain.
Initial encounter.

EXAM:
PORTABLE PELVIS 1-2 VIEWS

[outlet]
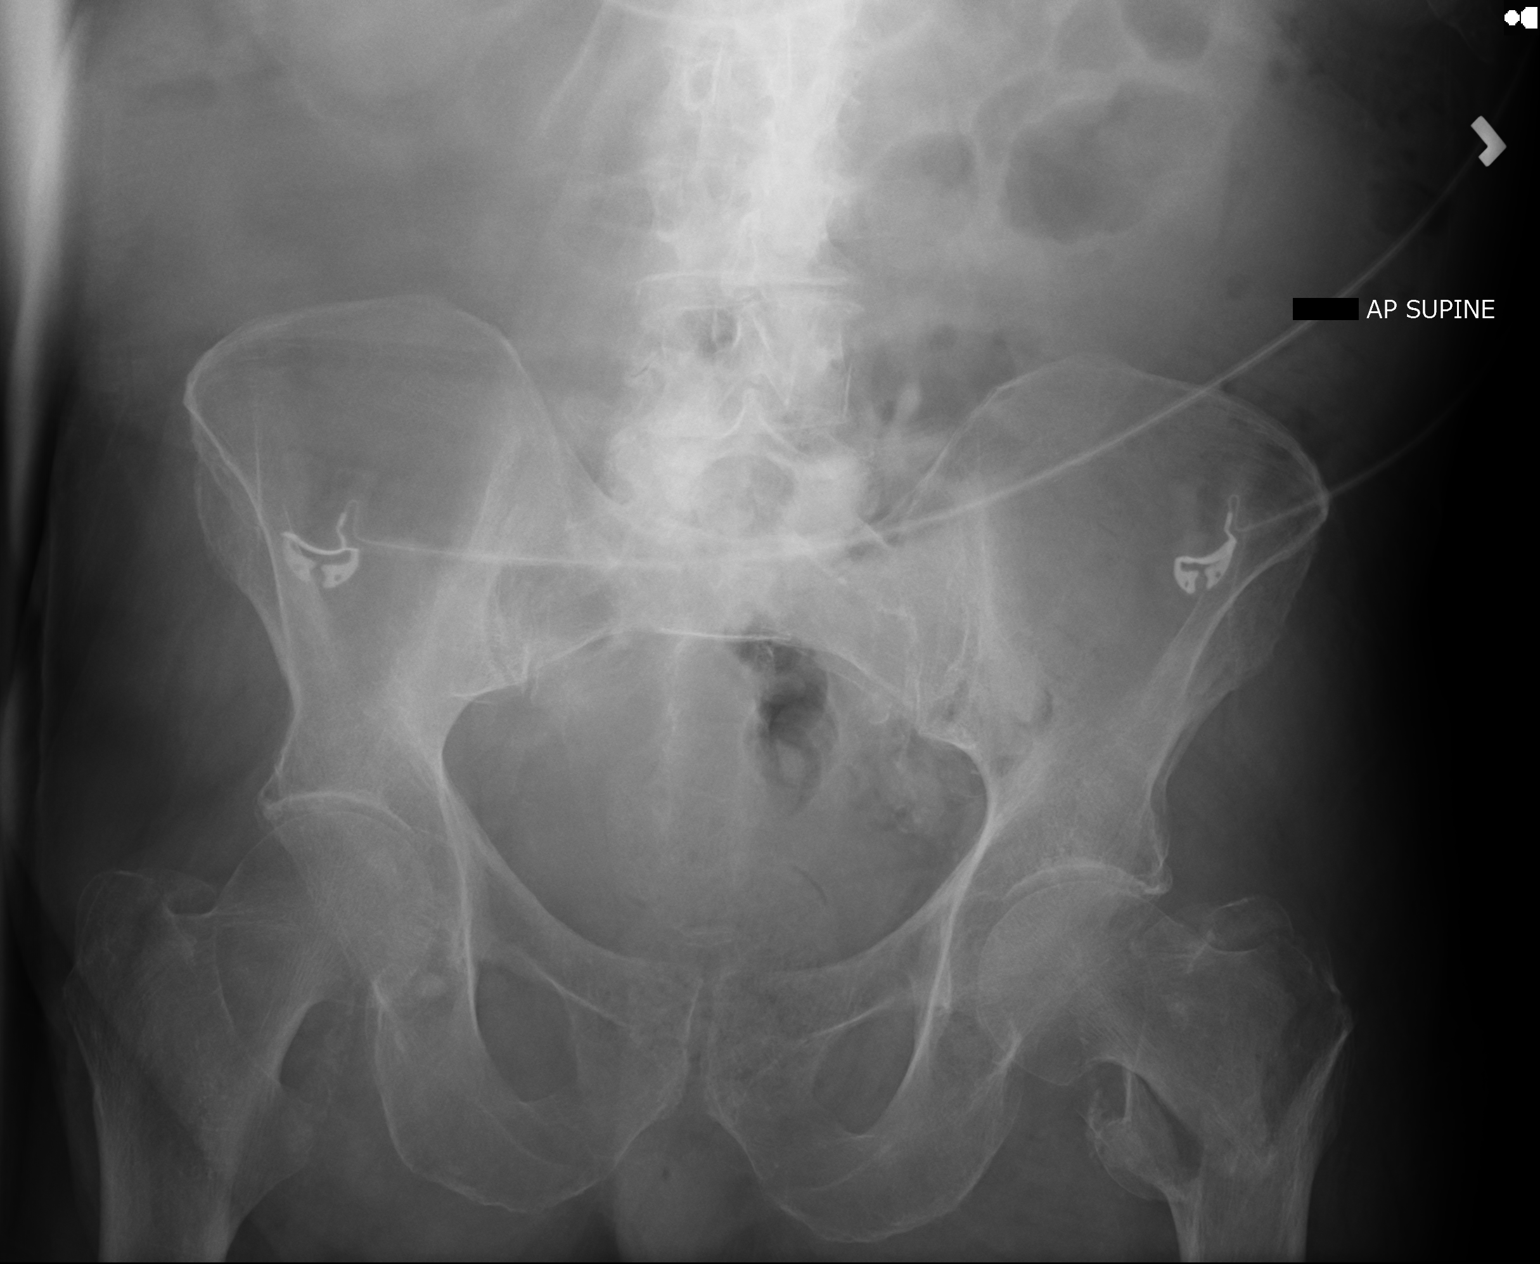

[1 of 1 positions shown; findings below may reference images not displayed]

FINDINGS: Acute comminuted intertrochanteric LEFT femoral neck fracture. LEFT
hip joint anatomically aligned with mild axial joint space
narrowing. Osseous demineralization.

Symmetric mild axial joint space narrowing in the contralateral
RIGHT hip. Bone island in the LOWER RIGHT acetabulum. Sacroiliac
joints and symphysis pubis anatomically aligned without significant
degenerative changes. Degenerative changes involving the visualized
LOWER lumbar spine.
IMPRESSION: Acute comminuted intertrochanteric LEFT femoral neck fracture.

## 2020-03-24 IMAGING — DX LEFT FOOT - COMPLETE 3+ VIEW
1 series · 1 of 1 positions shown · non-contrast
Comparison: None.

CLINICAL DATA: Fall

EXAM:
LEFT FOOT-SINGLE VIEW

[foot lat]
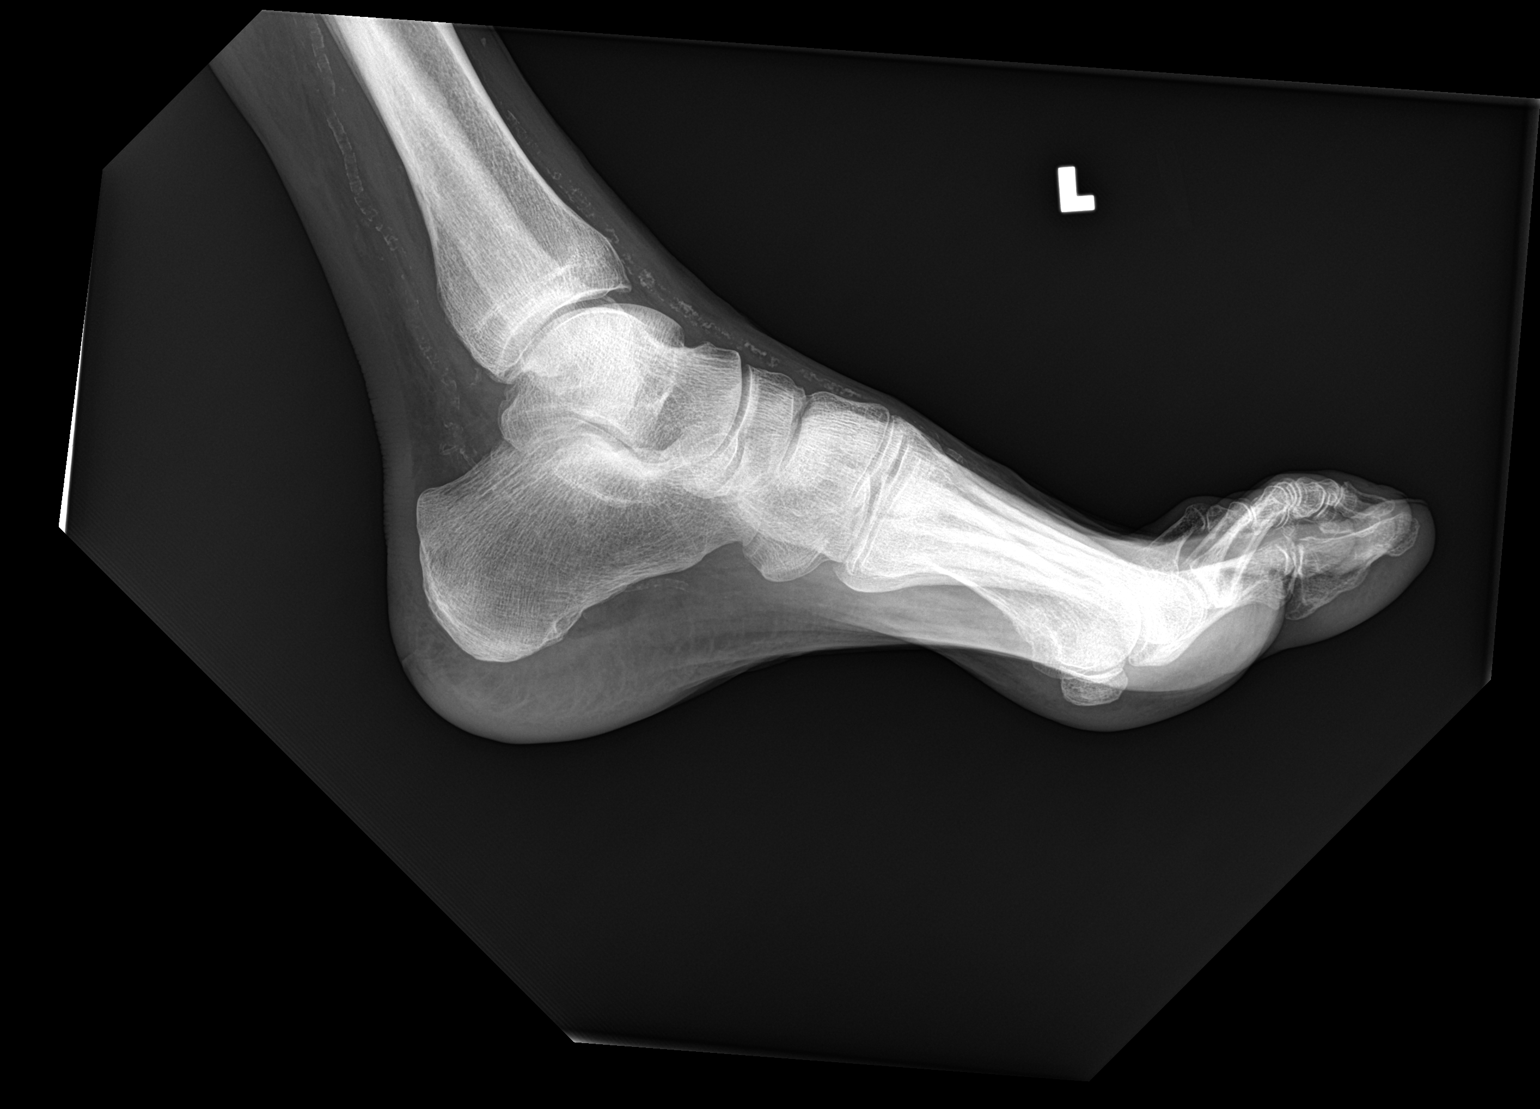

[1 of 1 positions shown; findings below may reference images not displayed]

FINDINGS: A single lateral view of the foot was obtained. This shows no acute
fracture or dislocation. There are dorsal and posterior vascular
calcifications. There is skin thickening of the left heel. No soft
tissue gas or focal ulceration.
IMPRESSION: Skin thickening of the left heel.  No soft tissue gas or ulceration.

## 2020-03-26 IMAGING — CR PORTABLE CHEST - 1 VIEW
1 series · 1 of 1 positions shown · non-contrast
Comparison: 03/11/2019

CLINICAL DATA: Cough, hypertension

EXAM:
PORTABLE CHEST 1 VIEW

[portable]
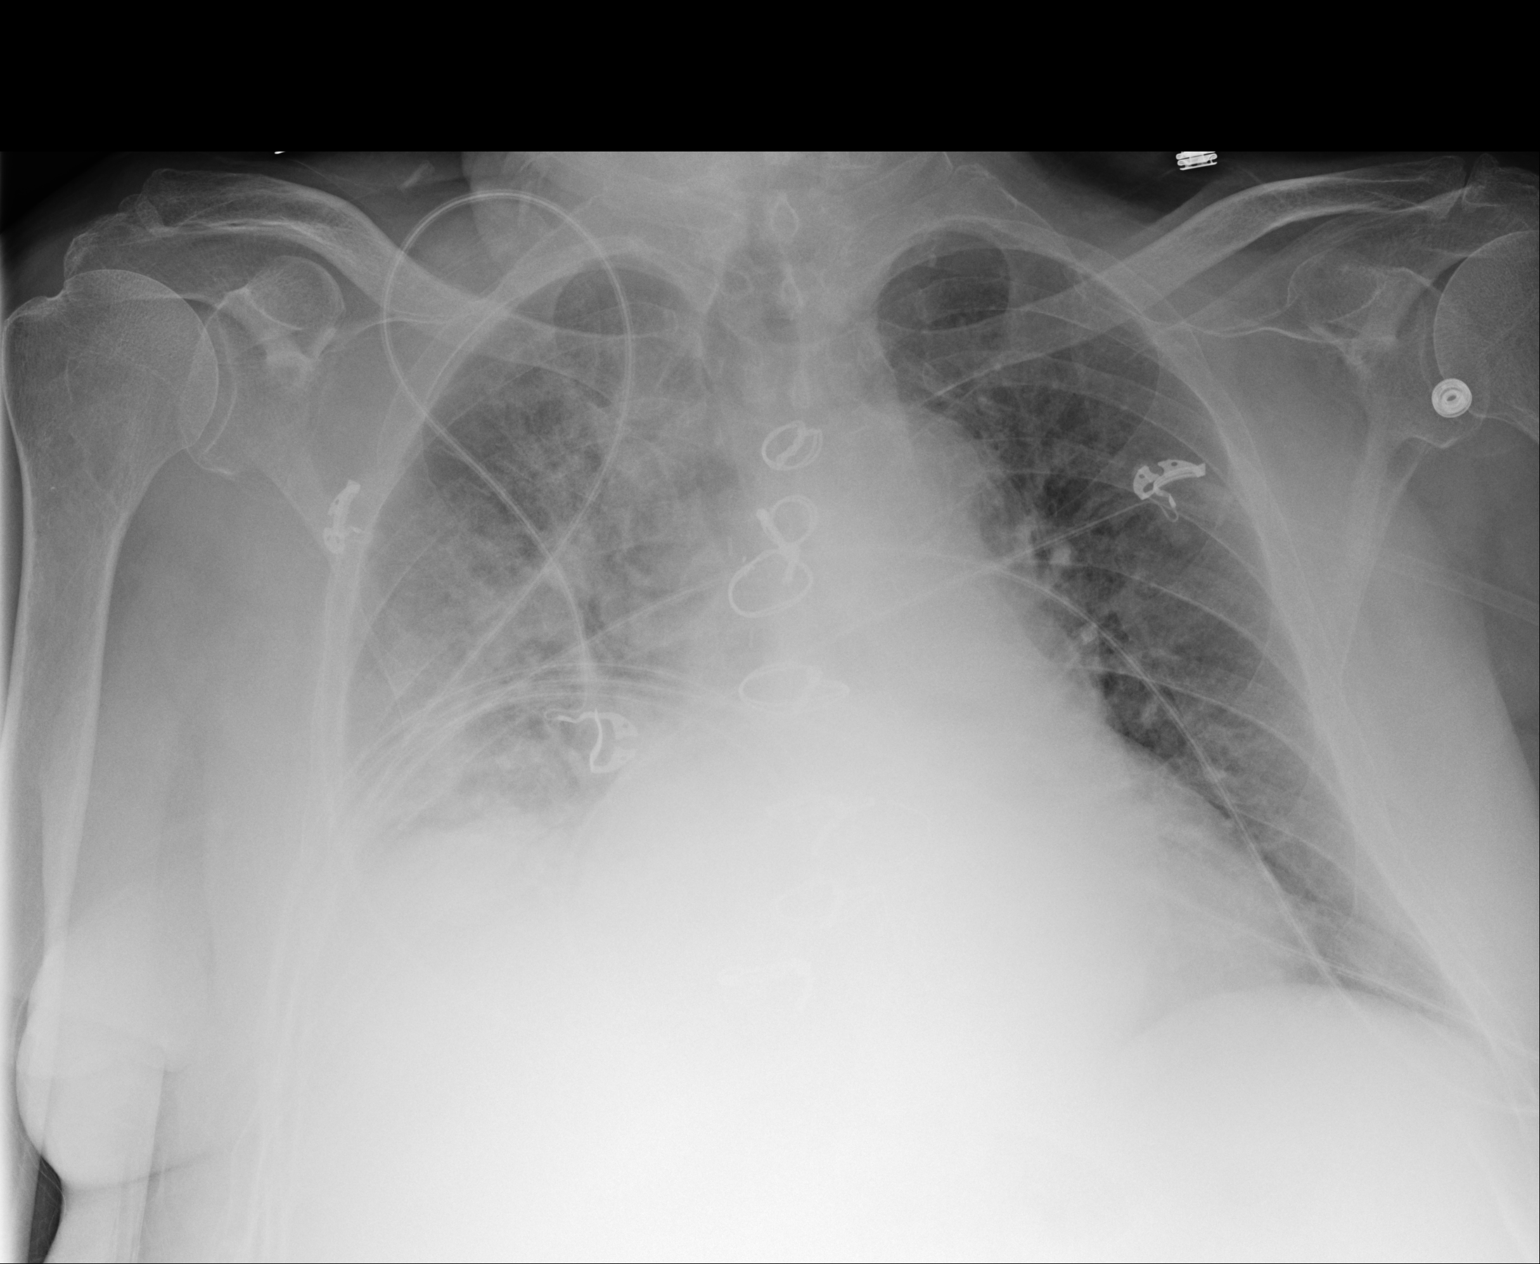

[1 of 1 positions shown; findings below may reference images not displayed]

FINDINGS: Moderate cardiomegaly. Prior median sternotomy. Persistent elevation
of the right hemidiaphragm. Diffuse right lung airspace opacities.
Left lung remains clear. No pneumothorax.
IMPRESSION: Persistent airspace opacities throughout the right lung.

## 2020-03-28 IMAGING — RF OPERATIVE LEFT HIP WITH PELVIS
1 series · 3 of 3 positions shown · non-contrast
Comparison: Plain films left hip 03/11/2019.

CLINICAL DATA: The patient suffered a left intertrochanteric
fracture in a fall 03/11/2019. Intraoperative imaging for fixation.
Initial encounter.

EXAM:
OPERATIVE LEFT  HIP (WITH PELVIS IF PERFORMED) 2 VIEWS
TECHNIQUE: Fluoroscopic spot image(s) were submitted for interpretation
post-operatively.

[Series 1: run · 3 of 3 slices shown]
[im 1/3]
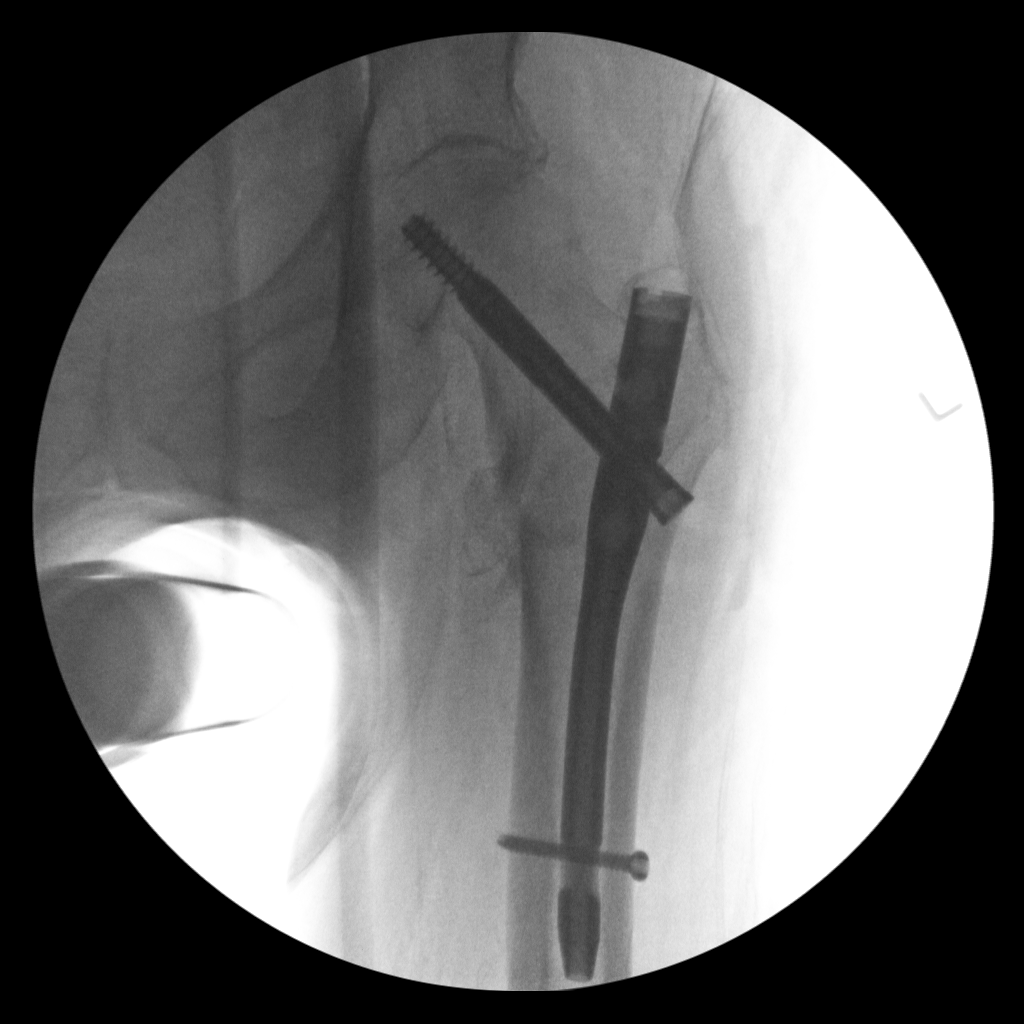
[im 2/3]
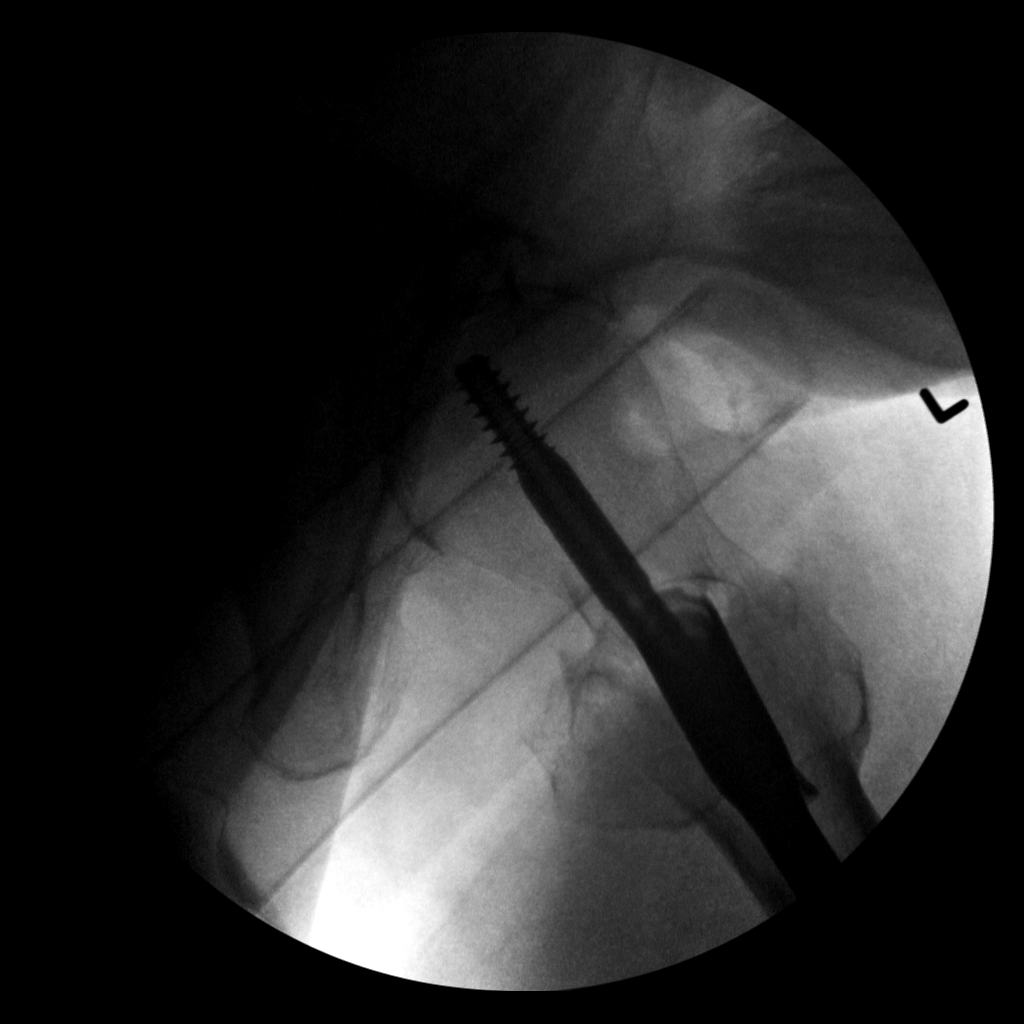
[im 3/3]
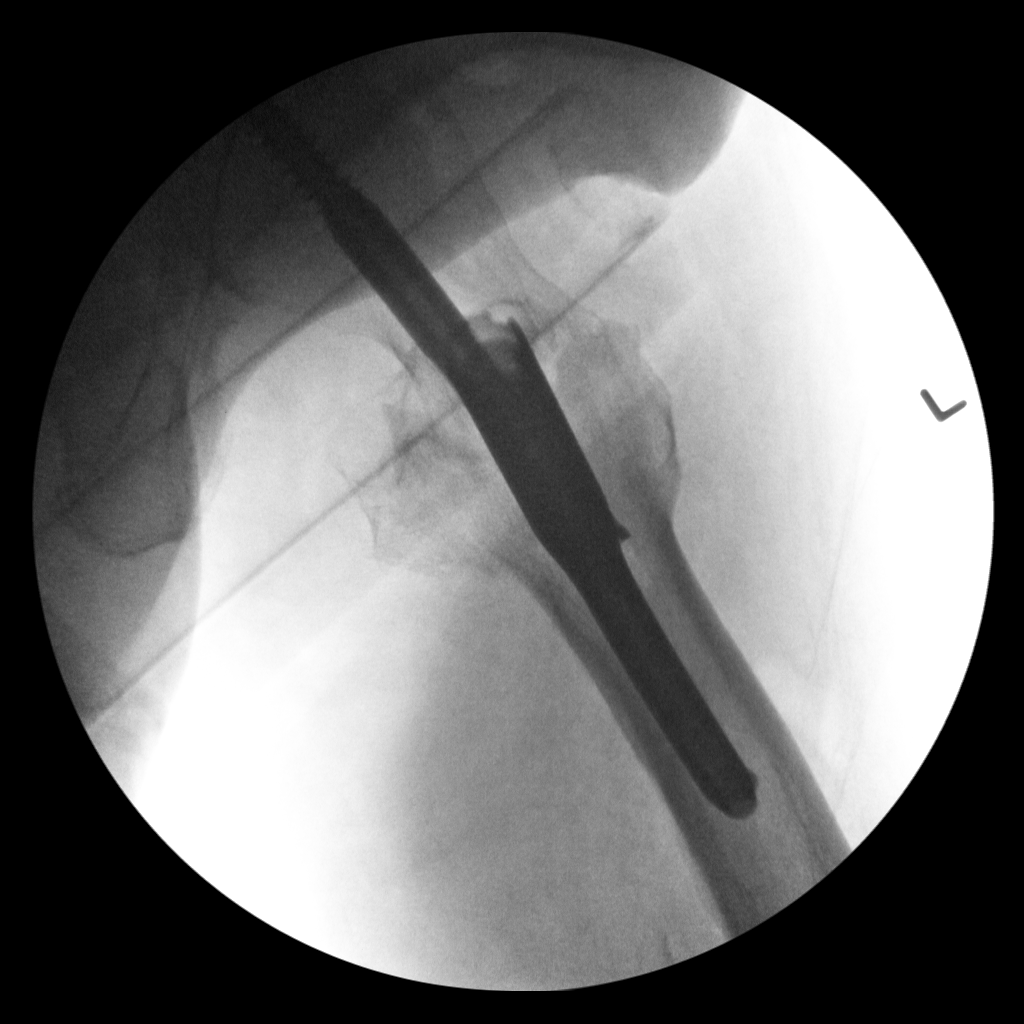

[3 of 3 positions shown; findings below may reference images not displayed]

FINDINGS: Two intraoperative fluoroscopic spot views demonstrate a hip screw
and short intramedullary nail with a single distal screw in place
for fixation of an intertrochanteric fracture. Position and
alignment are improved. No new abnormality.
IMPRESSION: Intraoperative imaging for fixation a left intertrochanteric
fracture. No acute finding.

## 2020-04-17 DIAGNOSIS — 419620001 Death: Secondary | SNOMED CT | POA: Diagnosis not present

## 2020-04-17 DEATH — deceased

## 2020-05-04 ENCOUNTER — Telehealth: Payer: Self-pay | Admitting: Cardiology

## 2020-05-04 NOTE — Telephone Encounter (Signed)
Patient is deceased per [facility]   Unable to find obituary to send to HIM to have patient marked as deceased.

## 2020-06-01 ENCOUNTER — Telehealth: Payer: Medicare Other | Admitting: Cardiology
